# Patient Record
Sex: Male | Born: 1960 | Race: White | Hispanic: No | Marital: Married | Smoking: Never smoker
Health system: Southern US, Community
[De-identification: ages and names within clinical notes are randomized; demographics above are authoritative.]

## PROBLEM LIST (undated history)

## (undated) DIAGNOSIS — I10 Essential (primary) hypertension: Secondary | ICD-10-CM

## (undated) DIAGNOSIS — E785 Hyperlipidemia, unspecified: Secondary | ICD-10-CM

## (undated) DIAGNOSIS — B351 Tinea unguium: Secondary | ICD-10-CM

## (undated) DIAGNOSIS — G4730 Sleep apnea, unspecified: Secondary | ICD-10-CM

## (undated) DIAGNOSIS — F429 Obsessive-compulsive disorder, unspecified: Secondary | ICD-10-CM

## (undated) DIAGNOSIS — J302 Other seasonal allergic rhinitis: Secondary | ICD-10-CM

## (undated) DIAGNOSIS — L578 Other skin changes due to chronic exposure to nonionizing radiation: Secondary | ICD-10-CM

## (undated) DIAGNOSIS — G4733 Obstructive sleep apnea (adult) (pediatric): Secondary | ICD-10-CM

## (undated) HISTORY — PX: CYSTECTOMY: SUR359

## (undated) HISTORY — DX: Tinea unguium: B35.1

## (undated) HISTORY — DX: Hyperlipidemia, unspecified: E78.5

## (undated) HISTORY — PX: COLONOSCOPY: SHX174

## (undated) HISTORY — DX: Obstructive sleep apnea (adult) (pediatric): G47.33

## (undated) HISTORY — DX: Sleep apnea, unspecified: G47.30

## (undated) HISTORY — DX: Essential (primary) hypertension: I10

## (undated) HISTORY — DX: Other seasonal allergic rhinitis: J30.2

## (undated) HISTORY — DX: Other skin changes due to chronic exposure to nonionizing radiation: L57.8

## (undated) HISTORY — DX: Obsessive-compulsive disorder, unspecified: F42.9

---

## 1999-02-27 ENCOUNTER — Encounter: Payer: Self-pay | Admitting: Pulmonary Disease

## 2002-09-08 ENCOUNTER — Encounter: Admission: RE | Admit: 2002-09-08 | Discharge: 2002-09-08 | Payer: Self-pay | Admitting: Family Medicine

## 2002-09-08 ENCOUNTER — Encounter: Payer: Self-pay | Admitting: Family Medicine

## 2004-07-10 ENCOUNTER — Ambulatory Visit: Payer: Self-pay | Admitting: Gastroenterology

## 2004-07-19 ENCOUNTER — Ambulatory Visit: Payer: Self-pay | Admitting: Family Medicine

## 2004-07-23 ENCOUNTER — Ambulatory Visit: Payer: Self-pay | Admitting: Gastroenterology

## 2004-10-02 ENCOUNTER — Ambulatory Visit: Payer: Self-pay | Admitting: Family Medicine

## 2004-11-05 ENCOUNTER — Ambulatory Visit: Payer: Self-pay | Admitting: Pulmonary Disease

## 2005-04-18 ENCOUNTER — Ambulatory Visit: Payer: Self-pay | Admitting: Family Medicine

## 2005-04-30 ENCOUNTER — Ambulatory Visit: Payer: Self-pay | Admitting: Family Medicine

## 2005-07-21 ENCOUNTER — Ambulatory Visit: Payer: Self-pay | Admitting: Family Medicine

## 2005-11-11 ENCOUNTER — Ambulatory Visit: Payer: Self-pay | Admitting: Family Medicine

## 2006-04-24 ENCOUNTER — Ambulatory Visit: Payer: Self-pay | Admitting: Family Medicine

## 2006-05-07 ENCOUNTER — Ambulatory Visit: Payer: Self-pay | Admitting: Family Medicine

## 2006-06-12 ENCOUNTER — Ambulatory Visit: Payer: Self-pay | Admitting: Family Medicine

## 2007-01-11 ENCOUNTER — Telehealth (INDEPENDENT_AMBULATORY_CARE_PROVIDER_SITE_OTHER): Payer: Self-pay | Admitting: *Deleted

## 2007-01-13 ENCOUNTER — Encounter: Payer: Self-pay | Admitting: Family Medicine

## 2007-01-13 DIAGNOSIS — E785 Hyperlipidemia, unspecified: Secondary | ICD-10-CM

## 2007-01-13 DIAGNOSIS — I10 Essential (primary) hypertension: Secondary | ICD-10-CM | POA: Insufficient documentation

## 2007-01-13 DIAGNOSIS — B351 Tinea unguium: Secondary | ICD-10-CM

## 2007-01-21 ENCOUNTER — Ambulatory Visit: Payer: Self-pay | Admitting: Family Medicine

## 2007-01-21 DIAGNOSIS — L578 Other skin changes due to chronic exposure to nonionizing radiation: Secondary | ICD-10-CM | POA: Insufficient documentation

## 2007-01-21 DIAGNOSIS — F429 Obsessive-compulsive disorder, unspecified: Secondary | ICD-10-CM

## 2007-01-22 LAB — CONVERTED CEMR LAB
ALT: 38 units/L (ref 0–40)
AST: 23 units/L (ref 0–37)
Cholesterol: 159 mg/dL (ref 0–200)
HDL: 29.3 mg/dL — ABNORMAL LOW (ref >39.0)
LDL Cholesterol: 102 mg/dL — ABNORMAL HIGH (ref 0–99)
TSH: 1.1 microintl units/mL (ref 0.35–5.50)
Total CHOL/HDL Ratio: 5.4
Triglycerides: 141 mg/dL (ref 0–149)
VLDL: 28 mg/dL (ref 0–40)

## 2007-02-01 ENCOUNTER — Telehealth: Payer: Self-pay | Admitting: Family Medicine

## 2007-03-03 ENCOUNTER — Ambulatory Visit: Payer: Self-pay | Admitting: Family Medicine

## 2007-04-01 ENCOUNTER — Ambulatory Visit: Payer: Self-pay | Admitting: Professional

## 2007-04-29 ENCOUNTER — Ambulatory Visit: Payer: Self-pay | Admitting: Professional

## 2007-05-06 ENCOUNTER — Ambulatory Visit: Payer: Self-pay | Admitting: Professional

## 2007-07-01 ENCOUNTER — Ambulatory Visit: Payer: Self-pay | Admitting: Professional

## 2007-07-15 ENCOUNTER — Ambulatory Visit: Payer: Self-pay | Admitting: Gastroenterology

## 2007-07-15 ENCOUNTER — Ambulatory Visit: Payer: Self-pay | Admitting: Professional

## 2007-07-26 ENCOUNTER — Ambulatory Visit: Payer: Self-pay | Admitting: Professional

## 2007-07-27 ENCOUNTER — Ambulatory Visit: Payer: Self-pay | Admitting: Gastroenterology

## 2007-07-27 ENCOUNTER — Encounter: Payer: Self-pay | Admitting: Family Medicine

## 2007-07-27 LAB — HM COLONOSCOPY: HM Colonoscopy: NORMAL

## 2007-08-06 ENCOUNTER — Ambulatory Visit: Payer: Self-pay | Admitting: Family Medicine

## 2007-08-09 LAB — CONVERTED CEMR LAB
ALT: 36 units/L (ref 0–53)
AST: 25 units/L (ref 0–37)
Albumin: 4.3 g/dL (ref 3.5–5.2)
Alkaline Phosphatase: 54 units/L (ref 39–117)
BUN: 16 mg/dL (ref 6–23)
Basophils Absolute: 0 10*3/uL (ref 0.0–0.1)
Basophils Relative: 0.4 % (ref 0.0–1.0)
Bilirubin, Direct: 0.1 mg/dL (ref 0.0–0.3)
CO2: 30 meq/L (ref 19–32)
Calcium: 9.7 mg/dL (ref 8.4–10.5)
Chloride: 99 meq/L (ref 96–112)
Cholesterol: 170 mg/dL (ref 0–200)
Creatinine, Ser: 1 mg/dL (ref 0.4–1.5)
Eosinophils Absolute: 0.2 10*3/uL (ref 0.0–0.6)
Eosinophils Relative: 2.5 % (ref 0.0–5.0)
GFR calc Af Amer: 104 mL/min
GFR calc non Af Amer: 86 mL/min
Glucose, Bld: 80 mg/dL (ref 70–99)
HCT: 41.7 % (ref 39.0–52.0)
HDL: 34.7 mg/dL — ABNORMAL LOW (ref >39.0)
Hemoglobin: 14.1 g/dL (ref 13.0–17.0)
LDL Cholesterol: 110 mg/dL — ABNORMAL HIGH (ref 0–99)
Lymphocytes Relative: 24.6 % (ref 12.0–46.0)
MCHC: 33.7 g/dL (ref 30.0–36.0)
MCV: 87.7 fL (ref 78.0–100.0)
Monocytes Absolute: 0.5 10*3/uL (ref 0.2–0.7)
Monocytes Relative: 8.1 % (ref 3.0–11.0)
Neutro Abs: 4.2 10*3/uL (ref 1.4–7.7)
Neutrophils Relative %: 64.4 % (ref 43.0–77.0)
Platelets: 274 10*3/uL (ref 150–400)
Potassium: 4 meq/L (ref 3.5–5.1)
RBC: 4.76 M/uL (ref 4.22–5.81)
RDW: 12.7 % (ref 11.5–14.6)
Sodium: 138 meq/L (ref 135–145)
TSH: 1.17 microintl units/mL (ref 0.35–5.50)
Total Bilirubin: 0.9 mg/dL (ref 0.3–1.2)
Total CHOL/HDL Ratio: 4.9
Total Protein: 7.5 g/dL (ref 6.0–8.3)
Triglycerides: 128 mg/dL (ref 0–149)
VLDL: 26 mg/dL (ref 0–40)
WBC: 6.5 10*3/uL (ref 4.5–10.5)

## 2007-08-11 ENCOUNTER — Ambulatory Visit: Payer: Self-pay | Admitting: Pulmonary Disease

## 2007-08-11 DIAGNOSIS — G4733 Obstructive sleep apnea (adult) (pediatric): Secondary | ICD-10-CM

## 2007-08-19 ENCOUNTER — Ambulatory Visit: Payer: Self-pay | Admitting: Professional

## 2007-09-06 ENCOUNTER — Encounter: Payer: Self-pay | Admitting: Family Medicine

## 2007-09-06 ENCOUNTER — Encounter (INDEPENDENT_AMBULATORY_CARE_PROVIDER_SITE_OTHER): Payer: Self-pay | Admitting: *Deleted

## 2007-09-10 ENCOUNTER — Ambulatory Visit: Payer: Self-pay | Admitting: Pulmonary Disease

## 2007-09-13 ENCOUNTER — Ambulatory Visit: Payer: Self-pay | Admitting: Professional

## 2007-10-08 ENCOUNTER — Encounter: Payer: Self-pay | Admitting: Pulmonary Disease

## 2007-10-13 ENCOUNTER — Ambulatory Visit: Payer: Self-pay | Admitting: Family Medicine

## 2007-10-14 ENCOUNTER — Telehealth (INDEPENDENT_AMBULATORY_CARE_PROVIDER_SITE_OTHER): Payer: Self-pay | Admitting: Internal Medicine

## 2008-02-08 ENCOUNTER — Telehealth: Payer: Self-pay | Admitting: Family Medicine

## 2008-03-09 ENCOUNTER — Ambulatory Visit: Payer: Self-pay | Admitting: Pulmonary Disease

## 2008-04-14 ENCOUNTER — Ambulatory Visit: Payer: Self-pay | Admitting: Family Medicine

## 2008-06-16 ENCOUNTER — Encounter (INDEPENDENT_AMBULATORY_CARE_PROVIDER_SITE_OTHER): Payer: Self-pay | Admitting: *Deleted

## 2008-06-27 ENCOUNTER — Ambulatory Visit: Payer: Self-pay | Admitting: Family Medicine

## 2008-07-17 ENCOUNTER — Telehealth: Payer: Self-pay | Admitting: Family Medicine

## 2008-09-20 ENCOUNTER — Ambulatory Visit: Payer: Self-pay | Admitting: Family Medicine

## 2008-09-22 ENCOUNTER — Ambulatory Visit: Payer: Self-pay | Admitting: Family Medicine

## 2008-09-25 LAB — CONVERTED CEMR LAB
ALT: 42 units/L (ref 0–53)
AST: 28 units/L (ref 0–37)
Albumin: 3.9 g/dL (ref 3.5–5.2)
Alkaline Phosphatase: 44 units/L (ref 39–117)
BUN: 16 mg/dL (ref 6–23)
Basophils Absolute: 0 10*3/uL (ref 0.0–0.1)
Basophils Relative: 0.3 % (ref 0.0–3.0)
Bilirubin, Direct: 0.1 mg/dL (ref 0.0–0.3)
CO2: 29 meq/L (ref 19–32)
Calcium: 9.3 mg/dL (ref 8.4–10.5)
Chloride: 105 meq/L (ref 96–112)
Cholesterol: 137 mg/dL (ref 0–200)
Creatinine, Ser: 1 mg/dL (ref 0.4–1.5)
Eosinophils Absolute: 0.2 10*3/uL (ref 0.0–0.7)
Eosinophils Relative: 2.9 % (ref 0.0–5.0)
GFR calc Af Amer: 104 mL/min
GFR calc non Af Amer: 86 mL/min
Glucose, Bld: 85 mg/dL (ref 70–99)
HCT: 36.9 % — ABNORMAL LOW (ref 39.0–52.0)
HDL: 27.2 mg/dL — ABNORMAL LOW (ref >39.0)
Hemoglobin: 12.6 g/dL — ABNORMAL LOW (ref 13.0–17.0)
LDL Cholesterol: 79 mg/dL (ref 0–99)
Lymphocytes Relative: 25.1 % (ref 12.0–46.0)
MCHC: 34.2 g/dL (ref 30.0–36.0)
MCV: 88.7 fL (ref 78.0–100.0)
Monocytes Absolute: 0.5 10*3/uL (ref 0.1–1.0)
Monocytes Relative: 9 % (ref 3.0–12.0)
Neutro Abs: 3.9 10*3/uL (ref 1.4–7.7)
Neutrophils Relative %: 62.7 % (ref 43.0–77.0)
Platelets: 262 10*3/uL (ref 150–400)
Potassium: 4.4 meq/L (ref 3.5–5.1)
RBC: 4.15 M/uL — ABNORMAL LOW (ref 4.22–5.81)
RDW: 13 % (ref 11.5–14.6)
Sodium: 140 meq/L (ref 135–145)
TSH: 1.05 microintl units/mL (ref 0.35–5.50)
Total Bilirubin: 0.8 mg/dL (ref 0.3–1.2)
Total CHOL/HDL Ratio: 5
Total Protein: 7 g/dL (ref 6.0–8.3)
Triglycerides: 154 mg/dL — ABNORMAL HIGH (ref 0–149)
VLDL: 31 mg/dL (ref 0–40)
WBC: 6.1 10*3/uL (ref 4.5–10.5)

## 2008-09-28 ENCOUNTER — Telehealth: Payer: Self-pay | Admitting: Family Medicine

## 2008-10-13 ENCOUNTER — Ambulatory Visit: Payer: Self-pay | Admitting: Internal Medicine

## 2008-10-13 DIAGNOSIS — J309 Allergic rhinitis, unspecified: Secondary | ICD-10-CM

## 2008-10-26 ENCOUNTER — Telehealth (INDEPENDENT_AMBULATORY_CARE_PROVIDER_SITE_OTHER): Payer: Self-pay | Admitting: Internal Medicine

## 2008-10-31 ENCOUNTER — Telehealth (INDEPENDENT_AMBULATORY_CARE_PROVIDER_SITE_OTHER): Payer: Self-pay | Admitting: Internal Medicine

## 2008-12-06 ENCOUNTER — Ambulatory Visit: Payer: Self-pay | Admitting: Family Medicine

## 2009-01-05 ENCOUNTER — Ambulatory Visit: Payer: Self-pay | Admitting: Family Medicine

## 2009-01-05 LAB — CONVERTED CEMR LAB
Casts: 0 /lpf
Glucose, Urine, Semiquant: NEGATIVE
Nitrite: NEGATIVE
Specific Gravity, Urine: 1.01
Urine crystals, microscopic: 0 /hpf
WBC Urine, dipstick: NEGATIVE
Yeast, UA: 0
pH: 7

## 2009-01-08 ENCOUNTER — Telehealth: Payer: Self-pay | Admitting: Family Medicine

## 2009-01-09 ENCOUNTER — Telehealth: Payer: Self-pay | Admitting: Family Medicine

## 2009-01-09 ENCOUNTER — Ambulatory Visit: Payer: Self-pay | Admitting: Cardiology

## 2009-01-10 ENCOUNTER — Telehealth: Payer: Self-pay | Admitting: Family Medicine

## 2009-03-25 ENCOUNTER — Encounter: Payer: Self-pay | Admitting: Pulmonary Disease

## 2009-04-09 ENCOUNTER — Ambulatory Visit: Payer: Self-pay | Admitting: Pulmonary Disease

## 2009-04-28 ENCOUNTER — Encounter: Payer: Self-pay | Admitting: Family Medicine

## 2009-07-30 ENCOUNTER — Telehealth: Payer: Self-pay | Admitting: Family Medicine

## 2009-09-21 ENCOUNTER — Ambulatory Visit: Payer: Self-pay | Admitting: Family Medicine

## 2009-09-24 LAB — CONVERTED CEMR LAB
Albumin: 4.3 g/dL (ref 3.5–5.2)
Alkaline Phosphatase: 50 units/L (ref 39–117)
BUN: 17 mg/dL (ref 6–23)
Basophils Absolute: 0 10*3/uL (ref 0.0–0.1)
CO2: 27 meq/L (ref 19–32)
Calcium: 9.4 mg/dL (ref 8.4–10.5)
Cholesterol: 159 mg/dL (ref 0–200)
Creatinine, Ser: 1 mg/dL (ref 0.4–1.5)
Eosinophils Absolute: 0.2 10*3/uL (ref 0.0–0.7)
Glucose, Bld: 81 mg/dL (ref 70–99)
HDL: 31 mg/dL — ABNORMAL LOW (ref >39.00)
Hemoglobin: 14 g/dL (ref 13.0–17.0)
Lymphocytes Relative: 23.7 % (ref 12.0–46.0)
MCHC: 32.7 g/dL (ref 30.0–36.0)
Neutro Abs: 4.3 10*3/uL (ref 1.4–7.7)
Neutrophils Relative %: 65 % (ref 43.0–77.0)
Platelets: 250 10*3/uL (ref 150.0–400.0)
RDW: 12.8 % (ref 11.5–14.6)
Total Bilirubin: 1 mg/dL (ref 0.3–1.2)
Triglycerides: 209 mg/dL — ABNORMAL HIGH (ref 0.0–149.0)

## 2009-10-18 IMAGING — CT CT ABDOMEN W/O CM
2 of 4 series · 17 of 46 positions shown, 19 images · non-contrast
Comparison: None.

CT ABDOMEN

CLINICAL DATA: Left flank pain and left lower quadrant pain for 5
days.  Microscopic hematuria.  History of right renal stones.

CT ABDOMEN AND PELVIS WITHOUT CONTRAST
TECHNIQUE: Multidetector CT imaging of the abdomen and pelvis was
performed following the standard protocol without intravenous
contrast.

[Series 2: xl stone wo · axial · 0.95mm/px · z∈[-431,-11]mm · 14 of 115 slices shown, 16 images]
[im 5/115  soft-tissue]
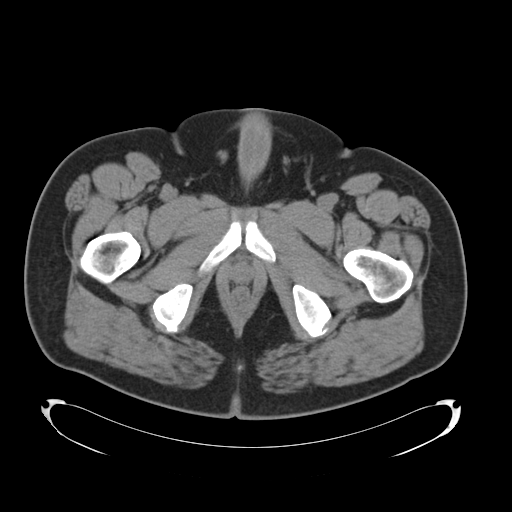
[im 5/115  bone]
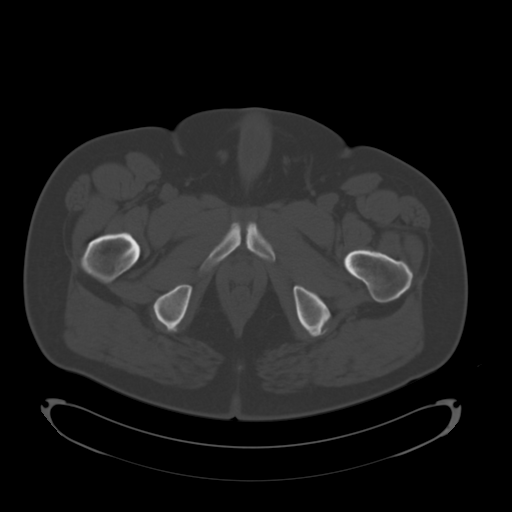
[im 15/115  soft-tissue]
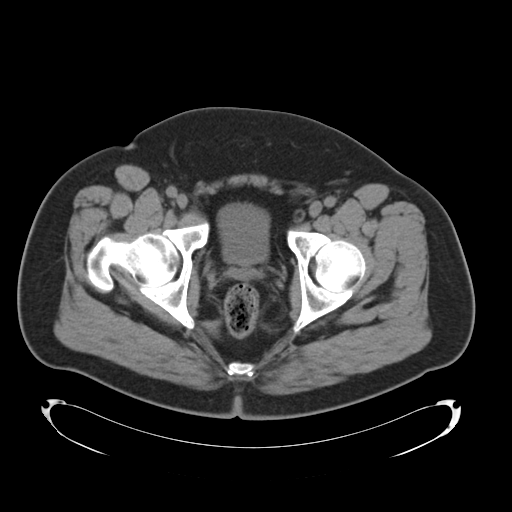
[im 24/115  soft-tissue]
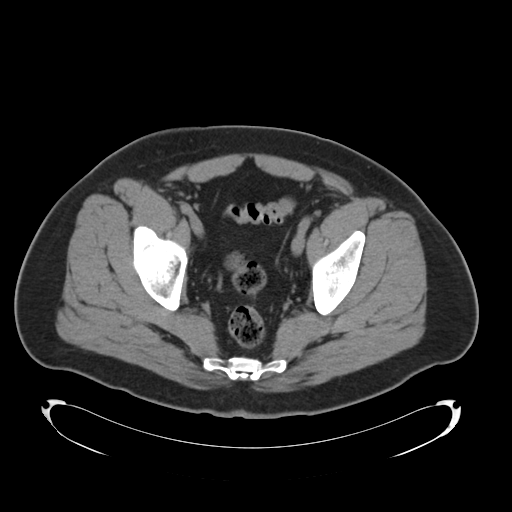
[im 29/115  soft-tissue]
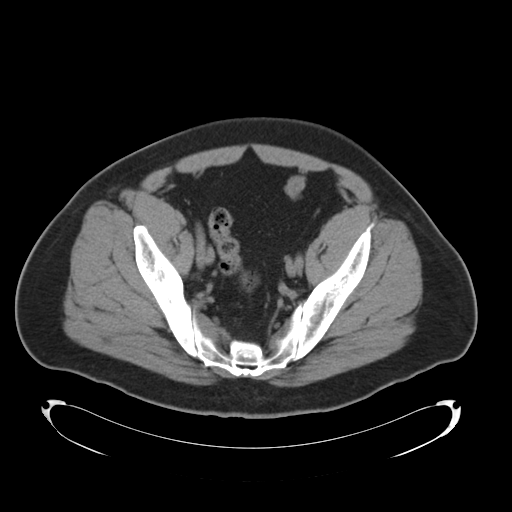
[im 39/115  soft-tissue]
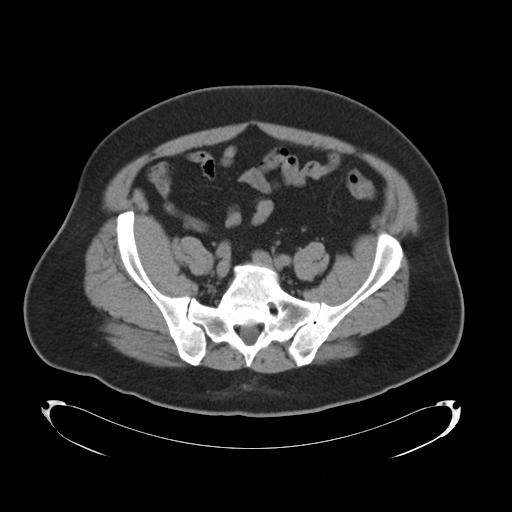
[im 48/115  soft-tissue]
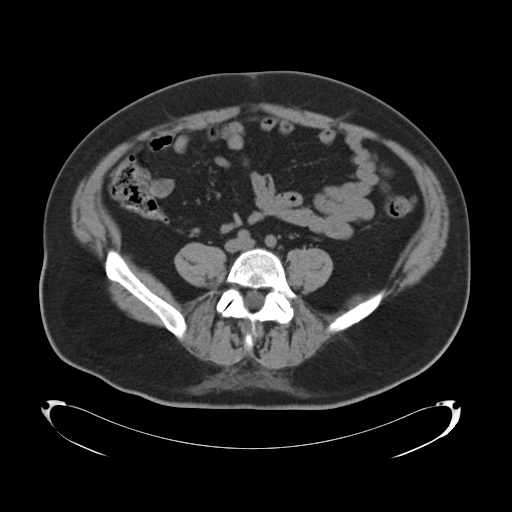
[im 53/115  soft-tissue]
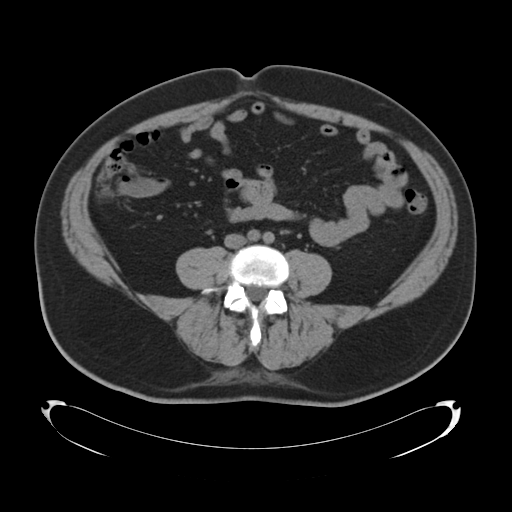
[im 62/115  soft-tissue]
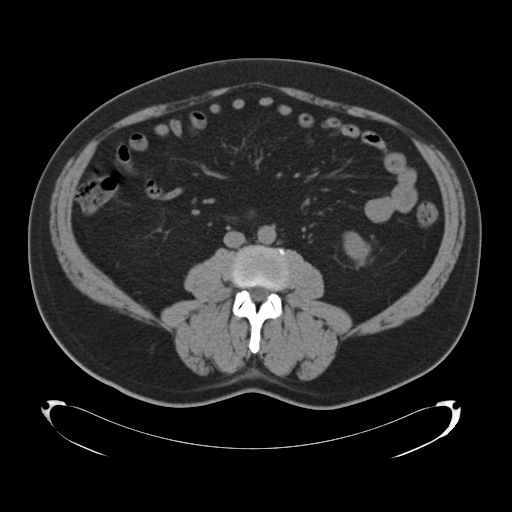
[im 67/115  soft-tissue]
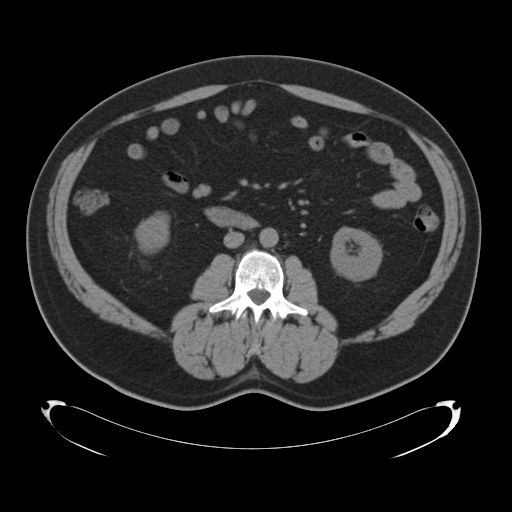
[im 67/115  bone]
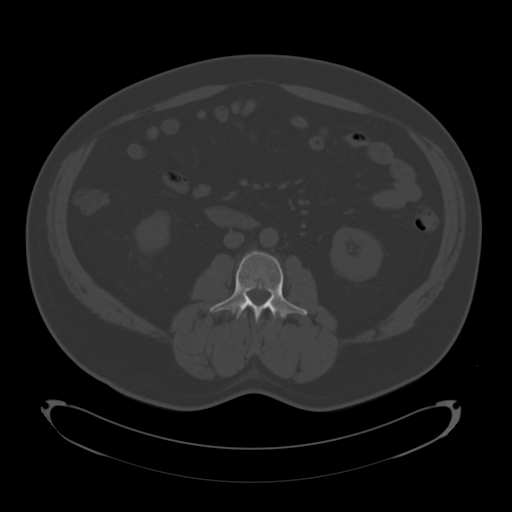
[im 77/115  soft-tissue]
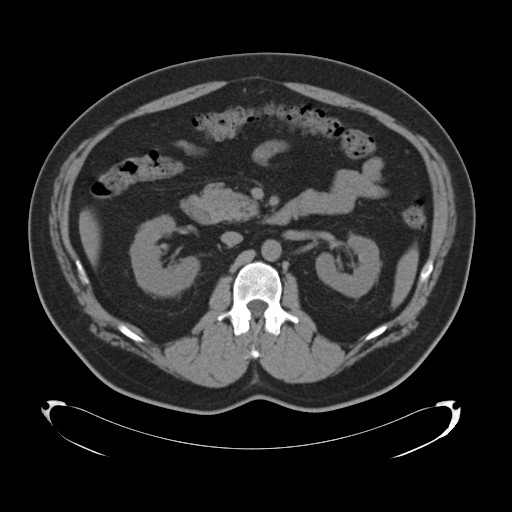
[im 86/115  soft-tissue]
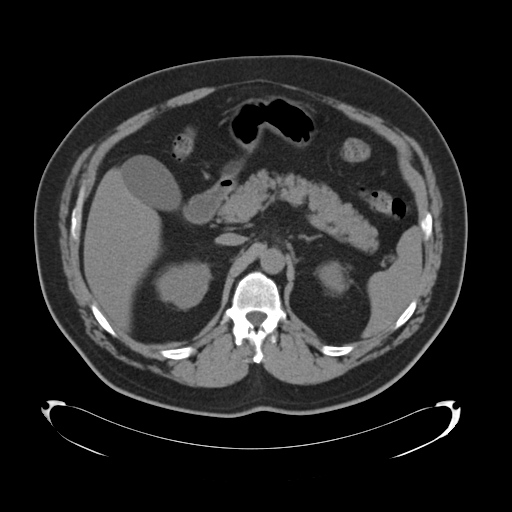
[im 91/115  soft-tissue]
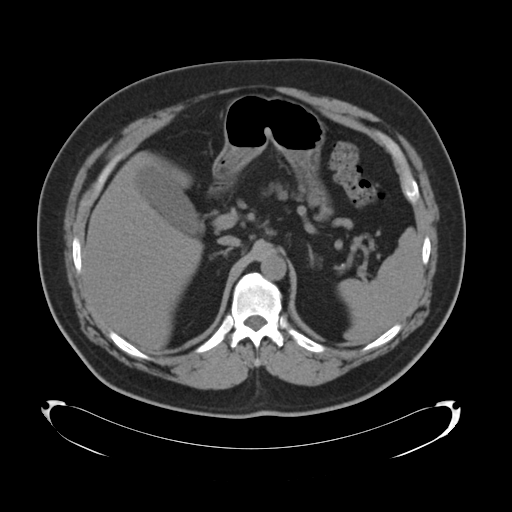
[im 100/115  soft-tissue]
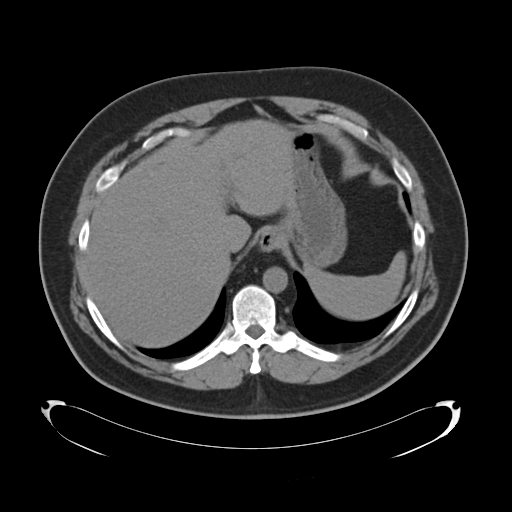
[im 110/115  soft-tissue]
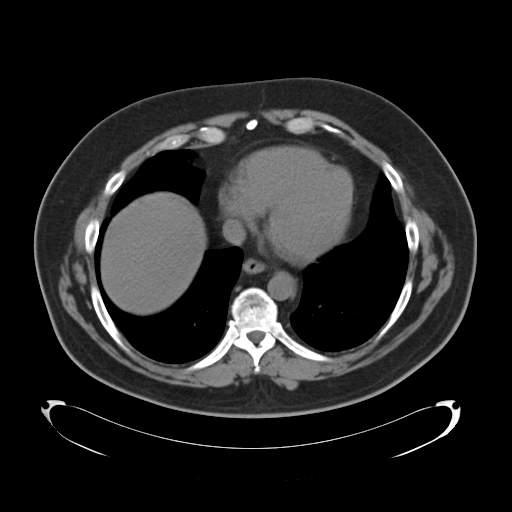

[Series 602: <mpr range> · coronal · 0.95mm/px · 3 of 155 slices shown]
[im 52/155  soft-tissue]
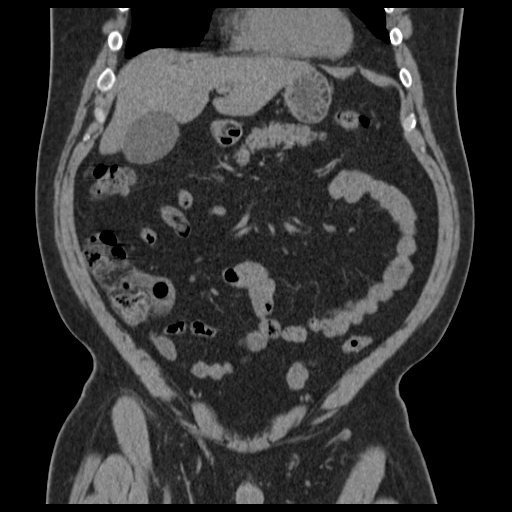
[im 69/155  soft-tissue]
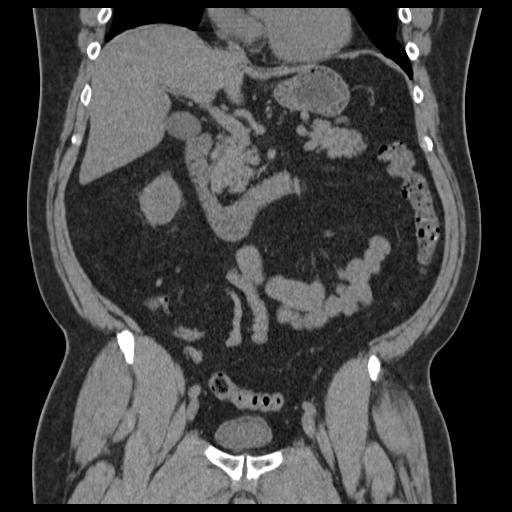
[im 86/155  soft-tissue]
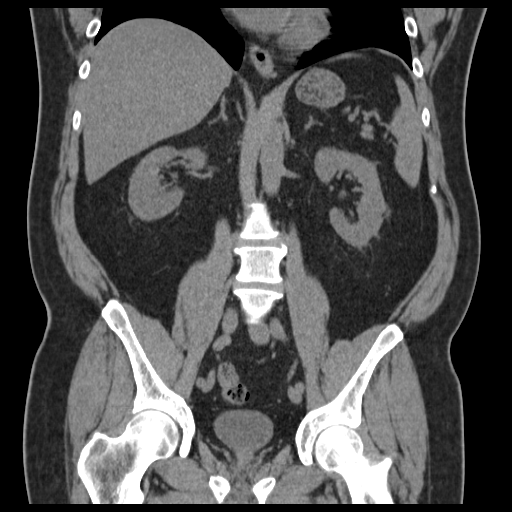

[17 of 46 positions shown; findings below may reference images not displayed]

FINDINGS: Lung bases clear.  No renal calculi or findings to
suggest renal collecting system obstruction.  Fatty infiltration
liver.  Unenhanced imaging of the liver otherwise unremarkable.

Unenhanced images of the spleen, pancreas and adrenal glands
unremarkable.  No calcified gallstones.  No abdominal aortic
aneurysm.  Mild calcification aortic bifurcation.  No upper
abdominal abnormal fluid collection or inflammatory process. Mild
degenerative changes lower thoracic upper lumbar spine.
IMPRESSION: No evidence of renal calculi or findings to suggest renal
collecting system obstruction.

CT PELVIS
FINDINGS: No ureteral calculi.  Evaluation of bladder limited
without contrast.  No pelvic inflammatory process.
IMPRESSION: No ureteral calculi.

No pelvic inflammatory process.

Atherosclerotic type changes aortic bifurcation extending into
common iliac arteries.

## 2009-12-24 ENCOUNTER — Ambulatory Visit: Payer: Self-pay | Admitting: Family Medicine

## 2009-12-24 LAB — CONVERTED CEMR LAB
AST: 24 units/L (ref 0–37)
Albumin: 4.2 g/dL (ref 3.5–5.2)
Alkaline Phosphatase: 60 units/L (ref 39–117)
Cholesterol: 133 mg/dL (ref 0–200)
LDL Cholesterol: 80 mg/dL (ref 0–99)
Total Protein: 6.6 g/dL (ref 6.0–8.3)
Triglycerides: 131 mg/dL (ref 0.0–149.0)

## 2010-01-01 ENCOUNTER — Ambulatory Visit: Payer: Self-pay | Admitting: Family Medicine

## 2010-01-01 DIAGNOSIS — E6609 Other obesity due to excess calories: Secondary | ICD-10-CM | POA: Insufficient documentation

## 2010-02-14 ENCOUNTER — Telehealth: Payer: Self-pay | Admitting: Family Medicine

## 2010-04-09 ENCOUNTER — Telehealth: Payer: Self-pay | Admitting: Pulmonary Disease

## 2010-04-24 ENCOUNTER — Ambulatory Visit: Payer: Self-pay | Admitting: Pulmonary Disease

## 2010-04-24 ENCOUNTER — Encounter (INDEPENDENT_AMBULATORY_CARE_PROVIDER_SITE_OTHER): Payer: Self-pay | Admitting: *Deleted

## 2010-09-16 ENCOUNTER — Telehealth (INDEPENDENT_AMBULATORY_CARE_PROVIDER_SITE_OTHER): Payer: Self-pay | Admitting: *Deleted

## 2010-09-18 ENCOUNTER — Ambulatory Visit
Admission: RE | Admit: 2010-09-18 | Discharge: 2010-09-18 | Payer: Self-pay | Source: Home / Self Care | Attending: Family Medicine | Admitting: Family Medicine

## 2010-09-18 ENCOUNTER — Other Ambulatory Visit: Payer: Self-pay | Admitting: Family Medicine

## 2010-09-18 LAB — BASIC METABOLIC PANEL
BUN: 21 mg/dL (ref 6–23)
CO2: 28 mEq/L (ref 19–32)
Calcium: 9.3 mg/dL (ref 8.4–10.5)
Chloride: 105 mEq/L (ref 96–112)
Creatinine, Ser: 1 mg/dL (ref 0.4–1.5)
GFR: 85.88 mL/min (ref >60.00)
Glucose, Bld: 81 mg/dL (ref 70–99)
Potassium: 4.4 mEq/L (ref 3.5–5.1)
Sodium: 139 mEq/L (ref 135–145)

## 2010-09-18 LAB — CBC WITH DIFFERENTIAL/PLATELET
Basophils Absolute: 0 10*3/uL (ref 0.0–0.1)
Basophils Relative: 0.4 % (ref 0.0–3.0)
Eosinophils Absolute: 0.2 10*3/uL (ref 0.0–0.7)
Eosinophils Relative: 3.3 % (ref 0.0–5.0)
HCT: 41.2 % (ref 39.0–52.0)
Hemoglobin: 13.9 g/dL (ref 13.0–17.0)
Lymphocytes Relative: 27.5 % (ref 12.0–46.0)
Lymphs Abs: 1.8 10*3/uL (ref 0.7–4.0)
MCHC: 33.8 g/dL (ref 30.0–36.0)
MCV: 89.3 fl (ref 78.0–100.0)
Monocytes Absolute: 0.6 10*3/uL (ref 0.1–1.0)
Monocytes Relative: 8.6 % (ref 3.0–12.0)
Neutro Abs: 3.9 10*3/uL (ref 1.4–7.7)
Neutrophils Relative %: 60.2 % (ref 43.0–77.0)
Platelets: 244 10*3/uL (ref 150.0–400.0)
RBC: 4.61 Mil/uL (ref 4.22–5.81)
RDW: 13.6 % (ref 11.5–14.6)
WBC: 6.6 10*3/uL (ref 4.5–10.5)

## 2010-09-18 LAB — TSH: TSH: 1.51 u[IU]/mL (ref 0.35–5.50)

## 2010-09-18 LAB — HEPATIC FUNCTION PANEL
ALT: 38 U/L (ref 0–53)
AST: 24 U/L (ref 0–37)
Albumin: 4.1 g/dL (ref 3.5–5.2)
Alkaline Phosphatase: 49 U/L (ref 39–117)
Bilirubin, Direct: 0.1 mg/dL (ref 0.0–0.3)
Total Bilirubin: 0.6 mg/dL (ref 0.3–1.2)
Total Protein: 7 g/dL (ref 6.0–8.3)

## 2010-09-18 LAB — LIPID PANEL
Cholesterol: 164 mg/dL (ref 0–200)
HDL: 29.8 mg/dL — ABNORMAL LOW (ref >39.00)
LDL Cholesterol: 109 mg/dL — ABNORMAL HIGH (ref 0–99)
Total CHOL/HDL Ratio: 6
Triglycerides: 125 mg/dL (ref 0.0–149.0)
VLDL: 25 mg/dL (ref 0.0–40.0)

## 2010-09-23 ENCOUNTER — Ambulatory Visit
Admission: RE | Admit: 2010-09-23 | Discharge: 2010-09-23 | Payer: Self-pay | Source: Home / Self Care | Attending: Family Medicine | Admitting: Family Medicine

## 2010-10-03 NOTE — Assessment & Plan Note (Signed)
Summary: CPX/CLE   Vital Signs:  Patient profile:   50 year old male Height:      70.25 inches Weight:      279 pounds BMI:     39.89 Temp:     98.3 degrees F oral Pulse rate:   64 / minute Pulse rhythm:   regular BP sitting:   118 / 80  (left arm) Cuff size:   regular  Vitals Entered By: Lowella Petties CMA (September 21, 2009 9:55 AM) CC: Check up   History of Present Illness: here for health mt exam  is doing well overall   is having some hemorroid problems -- occ brb streak on stool  some pain in rectum when he has large bm- is sharp in nature  sometimes stools are hard does eat high fiber diet  this happens infrequently -- last time was 2 weeks ago  occ uses prep H cream and that helps  occ feels a hem prolapsing  no abd pain   has a spot in between shoulder blades - bothers him  is better with new mattress  thinks he has a pinched nerve -- tender spot -- when it acts up goes down the back of arm making medial fingers numb  tylenol helps   wt is stable   is starting with wt [employer] on monday- plans to go to mts with her wife  plan for exercise - is to go back to the gym  soon - has the x box - does [employer]   colonosc last nl in 08-- due in 2013   bp is well controlled at 118/80 today   OCD -- is doing fine with that    lipids due Last Lipid ProfileCholesterol: 137 (09/22/2008 8:46:00 AM)HDL:  27.2 (09/22/2008 8:46:00 AM)LDL:  79 (09/22/2008 8:46:00 AM)Triglycerides:  Last Liver profileSGOT:  28 (09/22/2008 8:46:00 AM)SPGT:  42 (09/22/2008 8:46:00 AM)T. Bili:  0.8 (09/22/2008 8:46:00 AM)Alk Phos:  44 (09/22/2008 8:46:00 AM)   Td 04 and up to date  did get a flu shot this year   no prostate problems - no nocturia   Allergies: 1)  ! 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) ANXIETY STATE NOS (ICD-300.00) Hx of SLEEP APNEA (ICD-780.57) Hx of ONYCHOMYCOSIS (ICD-110.1) HYPERTENSION (ICD-401.9) HYPERLIPIDEMIA (ICD-272.4)    Past Surgical History: Last updated: 08/04/2007 Cyst on hand (1994) Colonoscopy- polyps (07/2001),  polyp (07/2004) colonoscopy 11/.08 nl  Family History: Last updated: 01/13/2007 Father: HTN, DM2 Mother: Deceased- colon cancer Siblings:   Social History: Last updated: 06/27/2008 Marital Status: Married non smoker Occupation: Radio producer  Risk Factors: Smoking Status: never (08/11/2007)  Review of Systems General:  Denies fatigue, loss of appetite, and malaise. Eyes:  Denies blurring and eye pain. CV:  Denies chest pain or discomfort, lightheadness, near fainting, and palpitations. Resp:  Denies cough and wheezing. GI:  Complains of hemorrhoids; denies abdominal pain, change in bowel habits, indigestion, and loss of appetite. GU:  Denies nocturia, urinary frequency, and urinary hesitancy. MS:  Denies joint pain and stiffness. Derm:  Denies itching, lesion(s), poor wound healing, and rash. Neuro:  Denies numbness and tingling. Psych:  Denies anxiety and depression; mood is ok-- doing well on current med . Endo:  Denies cold intolerance, excessive thirst, excessive urination, and heat intolerance. Heme:  Denies abnormal bruising and bleeding.  Physical Exam  General:  overweight but generally well appearing  Head:  normocephalic, atraumatic, and no abnormalities observed.   Eyes:  vision grossly intact, pupils equal, pupils round, and pupils reactive to light.  no conjunctival pallor, injection or icterus  Nose:  no nasal discharge.   Mouth:  pharynx pink and moist.   Neck:  supple with full rom and no masses or thyromegally, no JVD or carotid bruit  Chest Wall:  No deformities, masses, tenderness or gynecomastia noted. Lungs:  Normal respiratory effort,  chest expands symmetrically. Lungs are clear to auscultation, no crackles or wheezes. Heart:  Normal rate and regular rhythm. S1 and S2 normal without gallop, murmur, click, rub or other extra sounds. Abdomen:  Bowel sounds positive,abdomen soft and non-tender without masses, organomegaly or hernias noted. no renal bruits  Rectal:  nl tone with heme neg stool very small int hemorroid at 10:00  no active bleeding  no fissures seen currently Prostate:  Prostate gland firm and smooth, no enlargement, nodularity, tenderness, mass, asymmetry or induration. Msk:  No deformity or scoliosis noted of thoracic or lumbar spine.  no acute joint changes  Pulses:  R and L carotid,radial,femoral,dorsalis pedis and posterior tibial pulses are full and equal bilaterally Extremities:  No clubbing, cyanosis, edema, or deformity noted with normal full range of motion of all joints.   Neurologic:  sensation intact to light touch, gait normal, and DTRs symmetrical and normal.   Skin:  Intact without suspicious lesions or rashes Cervical Nodes:  No lymphadenopathy noted Inguinal Nodes:  No significant adenopathy Psych:  normal affect, talkative and pleasant    Impression & Recommendations:  Problem # 1:  HEALTH MAINTENANCE EXAM (ICD-V70.0) reviewed health habits including diet, exercise and skin cancer prevention reviewed health maintenance list and family history  again stressed imp of wt loss to general health Orders: Venipuncture (41324) TLB-Lipid Panel (80061-LIPID) TLB-BMP (Basic Metabolic Panel-BMET) (80048-METABOL) TLB-CBC Platelet - w/Differential (85025-CBCD) TLB-Hepatic/Liver Function Pnl (80076-HEPATIC) TLB-TSH (Thyroid Stimulating Hormone) (84443-TSH) Prescription Created Electronically (M0102)  Problem # 2:  OBSESSIVE-COMPULSIVE DISORDER (ICD-300.3) Assessment: Unchanged overall stable  continue lexapro  Problem # 3:  HYPERTENSION (ICD-401.9) Assessment: Unchanged  good controlwith   lotrel disc healthy diet (low simple sugar/ choose complex carbs/ low sat fat) diet and exercise in detail  His updated medication list for this problem includes:    Lotrel 5-10 Mg Caps (Amlodipine besy-benazepril hcl) .Marland Kitchen... Take one by mouth daily brand only  Orders: Venipuncture (72536) TLB-Lipid Panel (80061-LIPID) TLB-BMP (Basic Metabolic Panel-BMET) (80048-METABOL) TLB-CBC Platelet - w/Differential (85025-CBCD) TLB-Hepatic/Liver Function Pnl (80076-HEPATIC) TLB-TSH (Thyroid Stimulating Hormone) (84443-TSH)  BP today: 118/80 Prior BP: 124/82 (04/09/2009)  Labs Reviewed: K+: 4.4 (09/22/2008) Creat: : 1.0 (09/22/2008)   Chol: 137 (09/22/2008)   HDL: 27.2 (09/22/2008)   LDL: 79 (09/22/2008)   TG: 154 (09/22/2008)  Problem # 4:  HYPERLIPIDEMIA (ICD-272.4) Assessment: Unchanged  check labs today has been stable with statin and diet His updated medication list for this problem includes:    Zocor 20 Mg Tabs (Simvastatin) .Marland Kitchen... Take one by mouth daily  Orders: Venipuncture (64403) TLB-Lipid Panel (80061-LIPID) TLB-BMP (Basic Metabolic Panel-BMET) (80048-METABOL) TLB-CBC Platelet - w/Differential (85025-CBCD) TLB-Hepatic/Liver Function Pnl (80076-HEPATIC) TLB-TSH (Thyroid Stimulating Hormone) (84443-TSH)  Labs Reviewed: SGOT: 28 (09/22/2008)   SGPT: 42 (09/22/2008)   HDL:27.2 (09/22/2008), 34.7 (08/06/2007)  LDL:79 (09/22/2008), 110 (47/42/5956)  Chol:137 (09/22/2008), 170 (08/06/2007)  Trig:154 (09/22/2008), 128 (08/06/2007)  Problem # 5:  BLOOD IN STOOL (ICD-578.1) Assessment: New  suspect rectalfissure intermittent with straining /  vs small hemorroid disc keeping stools soft  anusol hc and update  early colonosc in order if not imp  Orders: Anoscopy (62130)  Complete Medication List: 1)  Zocor 20 Mg Tabs (Simvastatin) .... Take one by mouth daily 2)  Aspirin 81 Mg Tbec (Aspirin) .... Take one by mouth a day 3)  Lotrel 5-10 Mg Caps (Amlodipine besy-benazepril hcl)  .... Take one by mouth daily brand only 4)  Lexapro 20 Mg Tabs (Escitalopram oxalate) .... One by mouth daily 5)  Multivitamins Tabs (Multiple vitamin) .... Take 1 tablet by mouth once a day 6)  Fish Oil Caps  .... One by mouth daily 7)  Astepro 0.15 % Soln (Azelastine hcl) .... As needed 8)  Flonase 50 Mcg/act Susp (Fluticasone propionate) .... As needed 9)  Anusol-hc 25 Mg Supp (Hydrocortisone acetate) .Marland Kitchen.. 1 per rectum at bedtime for 7 days as needed for rectal pain or bleeding  Patient Instructions: 1)  get back to healthy diet and exercise 2)  labs today 3)  use anusol hc  as needed -- for rectal pain or bleeding - if not improved or if it becomes persistant - let me know  4)  keep stools soft 5)  keep up water intake  Prescriptions: ANUSOL-HC 25 MG SUPP (HYDROCORTISONE ACETATE) 1 per rectum at bedtime for 7 days as needed for rectal pain or bleeding  #7 x 2   Entered and Authorized by:   Judith Part MD   Signed by:   Judith Part MD on 09/21/2009   Method used:   Electronically to        [facility] # [phone]* (retail)       [address]       [place], [place]  [address]       Ph: [phone]       Fax: [phone]   RxID:   [id number] LEXAPRO 20 MG TABS (ESCITALOPRAM OXALATE) one by mouth daily  #30 x 11   Entered and Authorized by:   Judith Part MD   Signed by:   Judith Part MD on 09/21/2009   Method used:   Electronically to        [facility]. # [phone]* (retail)       [address]       [place], [place]  [address]       Ph: [phone]       Fax: [phone]   RxID:   [id number] LOTREL 5-10 MG CAPS (AMLODIPINE BESY-BENAZEPRIL HCL) Take one by mouth daily Brand only Brand medically necessary #30 x 11   Entered and Authorized by:   Judith Part MD   Signed by:   Judith Part MD on 09/21/2009   Method used:   Electronically to        [facility]. # [phone]* (retail)       [address]        [place], [place]  [address]       Ph: [phone]       Fax: [phone]   RxID:   [id number] ZOCOR 20 MG TABS (SIMVASTATIN) Take one by mouth daily  #30 x 11   Entered and Authorized by:   Judith Part MD   Signed by:   Judith Part MD on  09/21/2009   Method used:   Electronically to        [facility]. # [phone]* (retail)       [address]       [place], [place]  [address]       Ph: [phone]       Fax: [phone]   RxID:   [id number]   Prior Medications (reviewed today): ASPIRIN 81 MG TBEC (ASPIRIN) Take one by mouth a day LOTREL 5-10 MG CAPS (AMLODIPINE BESY-BENAZEPRIL HCL) Take one by mouth daily Brand only LEXAPRO 20 MG TABS (ESCITALOPRAM OXALATE) one by mouth daily MULTIVITAMINS   TABS (MULTIPLE VITAMIN) Take 1 tablet by mouth once a day FISH OIL CAPS () one by mouth daily ASTEPRO 0.15 % SOLN (AZELASTINE HCL) as needed FLONASE 50 MCG/ACT SUSP (FLUTICASONE PROPIONATE) as needed Current Allergies: ! ZOLOFT AMLODIPINE BESY-BENAZEPRIL HCL (AMLODIPINE BESY-BENAZEPRIL HCL)

## 2010-10-03 NOTE — Progress Notes (Signed)
Summary: pharmacy wants to change lotrel  Phone Note From Pharmacy   Caller: [facility] mail order Summary of Call: Pharmacy is asking to change lotrel to generic, form is on your shelf. Initial call taken by: Lowella Petties CMA,  February 14, 2010 9:07 AM  Follow-up for Phone Call        generic lotrel did NOT control his bp  I said no  please ask them not to ask me about this ever again -- they need to note in their records he cannot have generic at all Follow-up by: Judith Part MD,  February 14, 2010 9:34 AM  Additional Follow-up for Phone Call Additional follow up Details #1::        Kathlene November with [facility] [phone] notified as instructed by telephone. Also faxed completed form to [phone].Lewanda Rife LPN  February 14, 2010 10:14 AM

## 2010-10-03 NOTE — Assessment & Plan Note (Signed)
Summary: rov for osa   Primary Provider/Referring Provider:  Judith Part MD  CC:  Pt is here for a 1 yr f/u appt on his OSA.  Pt states he wears his cpap machine every night.  Approx 6 to 8 hours per night. Pt denied any complaints with mask or pressure.  Pt wonders if he is due for a new cpap machine. Pt denied any new complaints/concerns. .  History of Present Illness: The pt comes in today for f/u of his known osa.  He is wearing cpap compliantly, and reports no issues with his mask or pressure.  He is sleeping well at night, and denies any issues with daytime sleepiness.  He has had his machine for some time, and wants to know if he is due for a replacement.  Current Medications (verified): 1)  Zocor 20 Mg Tabs (Simvastatin) .... Take One By Mouth Daily 2)  Aspirin 81 Mg Tbec (Aspirin) .... Take One By Mouth A Day 3)  Lotrel 5-10 Mg Caps (Amlodipine Besy-Benazepril Hcl) .... Take One By Mouth Daily Brand Only 4)  Lexapro 20 Mg Tabs (Escitalopram Oxalate) .... One By Mouth Daily 5)  Multivitamins   Tabs (Multiple Vitamin) .... Take 1 Tablet By Mouth Once A Day 6)  Fish Oil Caps .... One By Mouth Daily 7)  Astepro 0.15 % Soln (Azelastine Hcl) .... As Needed 8)  Flonase 50 Mcg/act Susp (Fluticasone Propionate) .... As Needed 9)  Anusol-Hc 25 Mg Supp (Hydrocortisone Acetate) .Marland Kitchen.. 1 Per Rectum At Bedtime For 7 Days As Needed For Rectal Pain or Bleeding 10)  Allegra Otc .... Take As Directed  Allergies (verified): 1)  ! Zoloft 2)  ! * Shellfish 3)  Amlodipine Besy-Benazepril Hcl (Amlodipine Besy-Benazepril Hcl)  Review of Systems  The patient denies shortness of breath with activity, shortness of breath at rest, productive cough, non-productive cough, coughing up blood, chest pain, irregular heartbeats, acid heartburn, indigestion, loss of appetite, weight change, abdominal pain, difficulty swallowing, sore throat, tooth/dental problems, headaches, nasal congestion/difficulty breathing  through nose, sneezing, itching, ear ache, anxiety, depression, hand/feet swelling, joint stiffness or pain, rash, change in color of mucus, and fever.    Vital Signs:  Patient profile:   50 year old male Height:      70.25 inches Weight:      276.38 pounds BMI:     39.52 O2 Sat:      94 % on Room air Temp:     97.9 degrees F oral Pulse rate:   85 / minute BP sitting:   110 / 68  (right arm) Cuff size:   large  Vitals Entered By: Arman Filter LPN (April 24, 2010 3:02 PM)  O2 Flow:  Room air CC: Pt is here for a 1 yr f/u appt on his OSA.  Pt states he wears his cpap machine every night.  Approx 6 to 8 hours per night. Pt denied any complaints with mask or pressure.  Pt wonders if he is due for a new cpap machine. Pt denied any new complaints/concerns.  Comments Medications reviewed with patient Arman Filter LPN  April 24, 2010 3:02 PM    Physical Exam  General:  ow male in nad Nose:  no skin breakdown or pressure necrosis from cpap mask Extremities:  no edema or cyanosis  Neurologic:  alert, does not appear sleepy, and moves all 4 without obvious deficit.     Impression & Recommendations:  Problem # 1:  OBSTRUCTIVE SLEEP APNEA (ICD-327.23)  the pt is doing very well with cpap, and reports no issues with mask or pressure.  He is sleeping well, and is satisfied with his QOL.  Will need to check with insurance company and dme to see if it is time for a new cpap machine.  I have also encouraged him to work aggressively on wieght loss.  Medications Added to Medication List This Visit: 1)  Allegra Otc  .... Take as directed  Other Orders: Est. Patient Level III (04540) DME Referral (DME)  Patient Instructions: 1)  continue on cpap, and let me know if any issues 2)  work on weight loss 3)  followup with me in one year.

## 2010-10-03 NOTE — Assessment & Plan Note (Signed)
Summary: follow up to discuss blood work   Vital Signs:  Patient profile:   50 year old male Height:      70.25 inches Weight:      266.75 pounds BMI:     38.14 Temp:     98.8 degrees F oral Pulse rate:   64 / minute Pulse rhythm:   regular BP sitting:   106 / 68  (left arm) Cuff size:   large  Vitals Entered By: Lewanda Rife LPN (Jan 02, 4400 8:28 AM) CC: f/u to discuss labs, Hypertension Management   History of Present Illness: here to f/u for HTN and lipids   has lost significant wt -- 13 lb since last visit - bmi 38 now  no exercise yet  promises to start exercise tomorrow  is eating less and watching what he eats  has changed to whole wheat / and more pita bread  getting away from high fat spreads , and eating lower fat meats  eating more fish and salad  gave up hamburger on weight watchers- is down 15 lb   is starting to feel better more energy and sleep is better   used to eat pizza and ate out a lot   worse allergies this season  uses claritin  eyes bother him  HTN ingood control with bp 108/68  lipids better with trig 131/ HDL 27 (low) and LDL 80 (down from 109)- with statin and  diet liver tests entirely nl     Hypertension History:      Positive major cardiovascular risk factors include male age 80 years old or older, hyperlipidemia, and hypertension.  Negative major cardiovascular risk factors include non-tobacco-user status.    Allergies: 1)  ! Zoloft 2)  ! * Shellfish 3)  Amlodipine Besy-Benazepril Hcl (Amlodipine Besy-Benazepril Hcl)  Past History:  Past Surgical History: Last updated: 08/04/2007 Cyst on hand (1994) Colonoscopy- polyps (07/2001),  polyp (07/2004) colonoscopy 11/.08 nl  Family History: Last updated: 01/13/2007 Father: HTN, DM2 Mother: Deceased- colon cancer Siblings:   Social History: Last updated: 06/27/2008 Marital Status: Married non smoker Occupation: Radio producer  Risk Factors: Smoking Status: never  (08/11/2007)  Past Medical History:  OBSTRUCTIVE SLEEP APNEA (ICD-327.23) ACTINIC SKIN DAMAGE (ICD-692.70) OBSESSIVE-COMPULSIVE DISORDER (ICD-300.3) ANXIETY STATE NOS (ICD-300.00) Hx of SLEEP APNEA (ICD-780.57) Hx of ONYCHOMYCOSIS (ICD-110.1) HYPERTENSION (ICD-401.9) HYPERLIPIDEMIA (ICD-272.4) seasonal allergic rhinitis     Review of Systems General:  Denies fatigue, malaise, and sleep disorder. Eyes:  Denies blurring and eye irritation. CV:  Denies chest pain or discomfort, palpitations, shortness of breath with exertion, and swelling of feet. Resp:  Denies cough and wheezing. GI:  Denies abdominal pain, bloody stools, change in bowel habits, and indigestion. MS:  Denies joint pain, joint redness, joint swelling, and muscle aches. Derm:  Denies lesion(s), poor wound healing, and rash. Neuro:  Denies headaches, numbness, and tingling. Psych:  mood is god . Endo:  Denies cold intolerance, excessive thirst, excessive urination, and heat intolerance. Heme:  Denies abnormal bruising and bleeding.  Physical Exam  General:  overweight but generally well appearing  Head:  normocephalic, atraumatic, and no abnormalities observed.   Eyes:  vision grossly intact, pupils equal, pupils round, and pupils reactive to light.   Nose:  nares are boggy Mouth:  pharynx pink and moist.   Neck:  supple with full rom and no masses or thyromegally, no JVD or carotid bruit  Lungs:  Normal respiratory effort, chest expands symmetrically. Lungs are clear to auscultation,  no crackles or wheezes. Heart:  Normal rate and regular rhythm. S1 and S2 normal without gallop, murmur, click, rub or other extra sounds. Msk:  No deformity or scoliosis noted of thoracic or lumbar spine.  no acute joint changes  Pulses:  R and L carotid,radial,femoral,dorsalis pedis and posterior tibial pulses are full and equal bilaterally Extremities:  No clubbing, cyanosis, edema, or deformity noted with normal full range of  motion of all joints.   Neurologic:  sensation intact to light touch, gait normal, and DTRs symmetrical and normal.   Skin:  Intact without suspicious lesions or rashes Cervical Nodes:  No lymphadenopathy noted Psych:  normal affect, talkative and pleasant    Impression & Recommendations:  Problem # 1:  HYPERTENSION (ICD-401.9) Assessment Unchanged  good control with lotrel and wt loss  will continue to monitor - and cut back if necessary  urged to keep up the good work and add exercise His updated medication list for this problem includes:    Lotrel 5-10 Mg Caps (Amlodipine besy-benazepril hcl) .Marland Kitchen... Take one by mouth daily brand only  BP today: 106/68 Prior BP: 118/80 (09/21/2009)  Labs Reviewed: K+: 4.0 (09/21/2009) Creat: : 1.0 (09/21/2009)   Chol: 133 (12/24/2009)   HDL: 27.10 (12/24/2009)   LDL: 80 (12/24/2009)   TG: 131.0 (12/24/2009)  Problem # 2:  HYPERLIPIDEMIA (ICD-272.4) Assessment: Improved  improved with better diet enc to start exercise to bring up the HDL  rev labs and low sat fat diet in detail  lab and f/u jan His updated medication list for this problem includes:    Zocor 20 Mg Tabs (Simvastatin) .Marland Kitchen... Take one by mouth daily  Labs Reviewed: SGOT: 24 (12/24/2009)   SGPT: 41 (12/24/2009)   HDL:27.10 (12/24/2009), 31.00 (09/21/2009)  LDL:80 (12/24/2009), 79 (09/22/2008)  Chol:133 (12/24/2009), 159 (09/21/2009)  Trig:131.0 (12/24/2009), 209.0 (09/21/2009)  Problem # 3:  OBESITY (ICD-278.00) Assessment: Comment Only commended on wt loss so far enc to start daily walking in am   Problem # 4:  ALLERGIC RHINITIS (ICD-477.9) Assessment: Deteriorated this is improved with antihistamine recommend staying on claritin daily instead of as needed through the allergy season  update if wose  His updated medication list for this problem includes:    Astepro 0.15 % Soln (Azelastine hcl) .Marland Kitchen... As needed    Flonase 50 Mcg/act Susp (Fluticasone propionate) .Marland Kitchen... As  needed    Claritin 10 Mg Tabs (Loratadine) ..... Otc as directed.  Complete Medication List: 1)  Zocor 20 Mg Tabs (Simvastatin) .... Take one by mouth daily 2)  Aspirin 81 Mg Tbec (Aspirin) .... Take one by mouth a day 3)  Lotrel 5-10 Mg Caps (Amlodipine besy-benazepril hcl) .... Take one by mouth daily brand only 4)  Lexapro 20 Mg Tabs (Escitalopram oxalate) .... One by mouth daily 5)  Multivitamins Tabs (Multiple vitamin) .... Take 1 tablet by mouth once a day 6)  Fish Oil Caps  .... One by mouth daily 7)  Astepro 0.15 % Soln (Azelastine hcl) .... As needed 8)  Flonase 50 Mcg/act Susp (Fluticasone propionate) .... As needed 9)  Anusol-hc 25 Mg Supp (Hydrocortisone acetate) .Marland Kitchen.. 1 per rectum at bedtime for 7 days as needed for rectal pain or bleeding 10)  Claritin 10 Mg Tabs (Loratadine) .... Otc as directed.  Hypertension Assessment/Plan:      The patient's hypertensive risk group is category B: At least one risk factor (excluding diabetes) with no target organ damage.  His calculated 10 year risk of coronary heart  disease is 4 %.  Today's blood pressure is 106/68.     Patient Instructions: 1)  take claritin every day until allergy season is over  2)  keep up the good work with diet and exercise 3)  start walking 30 min per day in the ams - tomorrow  4)  schedule fasting labs and then follow up for PE in january wellness/ lipids/ v70.0   Current Allergies (reviewed today): ! ZOLOFT ! * SHELLFISH AMLODIPINE BESY-BENAZEPRIL HCL (AMLODIPINE BESY-BENAZEPRIL HCL)

## 2010-10-03 NOTE — Miscellaneous (Signed)
Summary: Flu Vaccination/[facility]  Flu Vaccination/[facility]   Imported By: Maryln Gottron 05/09/2009 15:39:23  _____________________________________________________________________  External Attachment:    Type:   Image     Comment:   External Document  Appended Document: Flu Vaccination/[facility] please note flu shot for EMR flow sheet  Appended Document: Flu Vaccination/[facility]     Clinical Lists Changes  Observations: Added new observation of FLU VAX: Fluvarin (04/28/2009 8:34)

## 2010-10-03 NOTE — Progress Notes (Signed)
----   Converted from flag ---- ---- 09/15/2010 4:13 PM, Colon Flattery Tower MD wrote: please check wellness and lipid - for v70.0 and 272 and 401.1 thanks   ---- 09/12/2010 10:54 AM, Liane Comber CMA (AAMA) wrote: Lab orders please! Good Morning! This pt is scheduled for cpx labs Wed, which labs to draw and dx codes to use? Thanks Tasha ------------------------------

## 2010-10-03 NOTE — Progress Notes (Signed)
Summary: nos appt  Phone Note Call from Patient   Caller: [email or web address]  Call For: clance Summary of Call: Rsc nos from 8/8 to 8/24 @ 3p, pt states he thought his appt was 8/11, he apologizes for the mix-up. Initial call taken by: Darletta Moll,  April 09, 2010 9:36 AM

## 2010-10-03 NOTE — Miscellaneous (Signed)
Summary: flu vaccine at [facility]   Clinical Lists Changes  Observations: Added new observation of FLU VAX: Historical (04/23/2010 15:30)      Influenza Immunization History:    Influenza # 1:  Historical (04/23/2010) Pt was given flu vaccine at [facility] in [place].             Lowella Petties CMA  April 24, 2010 3:31 PM

## 2010-10-09 NOTE — Assessment & Plan Note (Signed)
Summary: CPX / LFW   Vital Signs:  Patient profile:   50 year old male Height:      70.25 inches Weight:      277 pounds BMI:     39.61 Temp:     99.2 degrees F oral Pulse rate:   88 / minute Pulse rhythm:   regular BP sitting:   130 / 78  (left arm) Cuff size:   large  Vitals Entered By: Lewanda Rife LPN (September 23, 2010 2:38 PM) CC: CPX and slight sinus drainage   History of Present Illness: here for wellness exam and to review chronic health problems also c/o sinus drainage  had some sniffles over the weekend - nothing big just aggrivating  99.2 -- ? if fever at home   otherwise feeling good and nothing new    wt is stable at bmi of 39 did weight watchers for a while - for several months  he would like to go back to it  it did teach he and his wife a lot  not exercising is a big problem and eats the wrong foods -- eating out  perhaps needs to cut portions just a bit   does want to start walking for exercise  has a walking track at new job - when the weather clears  has x box -- dance videos    130/78-- bp is stable on lotrel   lipids trig 125 and HDL 29 (low) and LDL 109 (that is up from 80) thinks that is from a diet change - returning to old habits    colonosc 11/08- due in 2013 for family hx and polyps occasional hemorroids / fissure   no prostate problems at all  no nocturia   td 04  did get flu shot this year   OCD / mood-- is very good on current med   Allergies: 1)  ! Zoloft 2)  ! * Shellfish 3)  Amlodipine Besy-Benazepril Hcl (Amlodipine Besy-Benazepril Hcl)  Past History:  Past Medical History: Last updated: 01/01/2010  OBSTRUCTIVE SLEEP APNEA (ICD-327.23) ACTINIC SKIN DAMAGE (ICD-692.70) OBSESSIVE-COMPULSIVE DISORDER (ICD-300.3) ANXIETY STATE NOS (ICD-300.00) Hx of SLEEP APNEA (ICD-780.57) Hx of ONYCHOMYCOSIS (ICD-110.1) HYPERTENSION (ICD-401.9) HYPERLIPIDEMIA (ICD-272.4) seasonal allergic rhinitis     Past Surgical  History: Last updated: 08/04/2007 Cyst on hand (1994) Colonoscopy- polyps (07/2001),  polyp (07/2004) colonoscopy 11/.08 nl  Family History: Last updated: 01/13/2007 Father: HTN, DM2 Mother: Deceased- colon cancer Siblings:   Social History: Last updated: 09/23/2010 Marital Status: Married non smoker 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 Eyes:  Denies blurring and eye irritation. ENT:  Complains of nasal congestion and postnasal drainage; denies sinus pressure. CV:  Denies chest pain or discomfort, lightheadness, palpitations, and shortness of breath with exertion. Resp:  Denies cough, shortness of breath, and wheezing. GI:  Denies abdominal pain, change in bowel habits, constipation, indigestion, nausea, and vomiting. GU:  Denies dysuria, nocturia, urinary frequency, and urinary hesitancy. MS:  Denies muscle aches and cramps. Derm:  Denies itching, lesion(s), poor wound healing, and rash. Neuro:  Denies headaches, numbness, and tingling. Psych:  Denies anxiety and depression. Endo:  Denies excessive thirst and excessive urination. Heme:  Denies abnormal bruising and bleeding.  Physical Exam  General:  overweight  but generally well appearing  Head:  normocephalic, atraumatic, and no abnormalities observed.  no sinus tenderness  Eyes:  vision grossly intact, pupils equal, pupils round, and pupils reactive to light.  no conjunctival pallor, injection or icterus  Ears:  R ear normal and L ear normal.  scant cerumen bilat  Nose:  nares are boggy and slt injected  Mouth:  pharynx pink and moist, no erythema, and no exudates.   Neck:  supple with full rom and no masses or thyromegally, no JVD or  carotid bruit  Chest Wall:  No deformities, masses, tenderness or gynecomastia noted. Lungs:  Normal respiratory effort, chest expands symmetrically. Lungs are clear to auscultation, no crackles or wheezes. Heart:  Normal rate and regular rhythm. S1 and S2 normal without gallop, murmur, click, rub or other extra sounds. Abdomen:  Bowel sounds positive,abdomen soft and non-tender without masses, organomegaly or hernias noted. obese abdomen  Rectal:  No external abnormalities noted. Normal sphincter tone. No rectal masses or tenderness. Prostate:  Prostate gland firm and smooth, no enlargement, nodularity, tenderness, mass, asymmetry or induration. Msk:  No deformity or scoliosis noted of thoracic or lumbar spine.  no acute joint changes  Pulses:  R and L carotid,radial,femoral,dorsalis pedis and posterior tibial pulses are full and equal bilaterally Extremities:  No clubbing, cyanosis, edema, or deformity noted with normal full range of motion of all joints.   Neurologic:  sensation intact to light touch, gait normal, and DTRs symmetrical and normal.   Skin:  Intact without suspicious lesions or rashes few angiomas on back  Cervical Nodes:  No lymphadenopathy noted Inguinal Nodes:  No significant adenopathy Psych:  normal affect, talkative and pleasant  mood is good    Impression & Recommendations:  Problem # 1:  HEALTH MAINTENANCE EXAM (ICD-V70.0) Assessment Comment Only reviewed health habits including diet, exercise and skin cancer prevention reviewed health maintenance list and family history stressed imp of wt loss wellness labs rev in detail DRE wnl  Problem # 2:  OBSESSIVE-COMPULSIVE DISORDER (ICD-300.3) Assessment: Unchanged doing very well on lexapro without change also less stressful job   Problem # 3:  HYPERTENSION (ICD-401.9) Assessment: Unchanged  fair control with brand name lotrel no change lab reviewed disc low salt diet  His updated medication list for this  problem includes:    Lotrel 5-10 Mg Caps (Amlodipine besy-benazepril hcl) .Marland Kitchen... Take one by mouth daily brand only  BP today: 130/78 Prior BP: 110/68 (04/24/2010)  Prior 10 Yr Risk Heart Disease: 4 % (01/01/2010)  Labs Reviewed: K+: 4.4 (09/18/2010) Creat: : 1.0 (09/18/2010)   Chol: 164 (09/18/2010)   HDL: 29.80 (09/18/2010)   LDL: 109 (09/18/2010)   TG: 125.0 (09/18/2010)  Orders: Prescription Created Electronically [phone])  Problem # 4:  HYPERLIPIDEMIA (ICD-272.4) Assessment: Deteriorated  this is up from poor diet rev low sat fat diet  re check 6 mo and f/u  His updated medication list for this problem includes:    Zocor 20 Mg Tabs (Simvastatin) .Marland Kitchen... Take one by mouth daily  Labs Reviewed: SGOT: 24 (09/18/2010)   SGPT: 38 (09/18/2010)  Prior 10 Yr Risk Heart Disease: 4 % (01/01/2010)   HDL:29.80 (09/18/2010), 27.10 (12/24/2009)  LDL:109 (09/18/2010), 80 (62/13/0865)  Chol:164 (09/18/2010), 133 (12/24/2009)  Trig:125.0 (09/18/2010), 131.0 (12/24/2009)  Orders: Prescription Created Electronically [id number])  Problem # 5:  VIRAL URI (ICD-465.9) Assessment: New recommend sympt care- see pt instructions   pt advised to update me if symptoms worsen or do not improve  His updated  medication list for this problem includes:    Aspirin 81 Mg Tbec (Aspirin) .Marland Kitchen... Take one by mouth a day  Complete Medication List: 1)  Zocor 20 Mg Tabs (Simvastatin) .... Take one by mouth daily 2)  Aspirin 81 Mg Tbec (Aspirin) .... Take one by mouth a day 3)  Lotrel 5-10 Mg Caps (Amlodipine besy-benazepril hcl) .... Take one by mouth daily brand only 4)  Lexapro 20 Mg Tabs (Escitalopram oxalate) .... One by mouth daily 5)  Multivitamins Tabs (Multiple vitamin) .... Take 1 tablet by mouth once a day 6)  Fish Oil Caps  .... One by mouth daily 7)  Astepro 0.15 % Soln (Azelastine hcl) .... As needed 8)  Flonase 50 Mcg/act Susp (Fluticasone propionate) .... 2 sprays in each nostril once daily as  needed 9)  Anusol-hc 25 Mg Supp (Hydrocortisone acetate) .Marland Kitchen.. 1 per rectum at bedtime for 7 days as needed for rectal pain or bleeding 10)  Allegra Otc  .... Take as directed 11)  Melatonin 3 Mg Tabs (Melatonin) .... Otc as directed.  Patient Instructions: 1)  you can try mucinex over the counter twice daily as directed and nasal saline spray for congestion 2)  tylenol over the counter as directed may help with aches, headache and fever 3)  call if symptoms worsen or if not improved in 4-5 days  4)  work hard on healthy diet and exercise for weight loss 5)  blood pressure is ok today and cholesterol is up  6)  schedule fasting labs and then follow up in 6 months lipid/ast/alt/ renal  272 , 401.1 Prescriptions: FLONASE 50 MCG/ACT SUSP (FLUTICASONE PROPIONATE) 2 sprays in each nostril once daily as needed  #1 mdi x 11   Entered and Authorized by:   Judith Part MD   Signed by:   Judith Part MD on 09/23/2010   Method used:   Electronically to        [facility]. # [id number]* (retail)       [address]       [place], [place]  [address]       Ph: [phone]       Fax: [phone]   RxID:   [id number] LEXAPRO 20 MG TABS (ESCITALOPRAM OXALATE) one by mouth daily  #30 x 11   Entered and Authorized by:   Judith Part MD   Signed by:   Judith Part MD on 09/23/2010   Method used:   Electronically to        [facility]. # [id number]* (retail)       [address]       [place], [place]  [address]       Ph: [phone]       Fax: [phone]   RxID:   [id number] LOTREL 5-10 MG CAPS (AMLODIPINE BESY-BENAZEPRIL HCL) Take one by mouth daily Brand only Brand medically necessary #30 x 11   Entered and Authorized by:   Judith Part MD   Signed by:   Judith Part MD on 09/23/2010   Method used:   Electronically to        [facility]. # [id number]* (retail)       [address]       [place], [place]  [address]  Ph:  [phone]       Fax: [phone]   RxID:   [id number] ZOCOR 20 MG TABS (SIMVASTATIN) Take one by mouth daily  #30 x 11   Entered and Authorized by:   Judith Part MD   Signed by:   Judith Part MD on 09/23/2010   Method used:   Electronically to        [facility]. # [id number]* (retail)       [address]       [place], [place]  [address]       Ph: [phone]       Fax: [phone]   RxID:   [id number]    Orders Added: 1)  Prescription Created Electronically [G8553] 2)  Est. Patient 40-64 years [99396] 3)  Est. Patient Level II [88416]    Current Allergies (reviewed today): ! ZOLOFT ! * SHELLFISH AMLODIPINE BESY-BENAZEPRIL HCL (AMLODIPINE BESY-BENAZEPRIL HCL)

## 2010-10-23 ENCOUNTER — Encounter: Payer: Self-pay | Admitting: Family Medicine

## 2010-10-23 ENCOUNTER — Ambulatory Visit (INDEPENDENT_AMBULATORY_CARE_PROVIDER_SITE_OTHER): Payer: Self-pay | Admitting: Family Medicine

## 2010-10-23 DIAGNOSIS — J209 Acute bronchitis, unspecified: Secondary | ICD-10-CM

## 2010-10-29 ENCOUNTER — Encounter: Payer: Self-pay | Admitting: Family Medicine

## 2010-10-29 ENCOUNTER — Telehealth: Payer: Self-pay | Admitting: Family Medicine

## 2010-10-29 NOTE — Assessment & Plan Note (Signed)
Summary: CONGESTION,RUNNY NOSE,FEVER,COUGH/CLE   Vital Signs:  Patient profile:   50 year old male Weight:      283 pounds Temp:     98.5 degrees F oral Pulse rate:   84 / minute Pulse rhythm:   regular BP sitting:   124 / 82  (left arm) Cuff size:   large  Vitals Entered By: Selena Batten Dance CMA (AAMA) (October 23, 2010 10:28 AM) CC: Cough/congestion   History of Present Illness: CC: cough/congestion  9 d h/o drainage as well as runny nose.  Over weekend seemed to move into chest, feverish, cough.  monday felt better but continued clear drainage.  Each day getting worse this week. Last night worsening cough, rattly, coughing green sputum up as well as purulent nasal discharge.  Trouble laying flat 2/2 congestion.  Took allegra D and theraflu today.  helping some.  feels like "walking PNA" (h/o same several years ago).  Having trouble sleeing 2/2 cough as well as coughing fits.  + body aches.  + HA and ST.  + L ear muffled.  No abd pain, n/v/d, rashes, myalgias, arthralgias.    + wife sick at home.  no smokers at home, no h/o asthma.  Current Medications (verified): 1)  Zocor 20 Mg Tabs (Simvastatin) .... Take One By Mouth Daily 2)  Aspirin 81 Mg Tbec (Aspirin) .... Take One By Mouth A Day 3)  Lotrel 5-10 Mg Caps (Amlodipine Besy-Benazepril Hcl) .... Take One By Mouth Daily Brand Only 4)  Lexapro 20 Mg Tabs (Escitalopram Oxalate) .... One By Mouth Daily 5)  Multivitamins   Tabs (Multiple Vitamin) .... Take 1 Tablet By Mouth Once A Day 6)  Fish Oil Caps .... One By Mouth Daily 7)  Astepro 0.15 % Soln (Azelastine Hcl) .... As Needed 8)  Flonase 50 Mcg/act Susp (Fluticasone Propionate) .... 2 Sprays in Each Nostril Once Daily As Needed 9)  Anusol-Hc 25 Mg Supp (Hydrocortisone Acetate) .Marland Kitchen.. 1 Per Rectum At Bedtime For 7 Days As Needed For Rectal Pain or Bleeding 10)  Allegra Otc .... Take As Directed 11)  Melatonin 3 Mg Tabs (Melatonin) .... Otc As Directed.  Allergies: 1)  ! Zoloft 2)   ! 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  congested Head:  normocephalic, atraumatic, and no abnormalities observed.  no sinus tenderness  Eyes:  vision grossly intact, pupils equal, pupils round, and pupils reactive to light.  no conjunctival pallor, injection or icterus  Ears:  R ear normal and L ear normal.  L TM covered by cerumen Nose:  nares are boggy and slt injected  Mouth:  pharynx pink and moist, no erythema, and no exudates.   Neck:  supple with full rom and no masses or thyromegally, no JVD or carotid bruit .  no LAD Lungs:  Normal respiratory effort, chest expands symmetrically. Lungs are clear to auscultation, no crackles or wheezes. Heart:  Normal rate and regular rhythm.  S1 and S2 normal without gallop, murmur, click, rub or other extra sounds. Pulses:  2+ rad pulses, brisk cap refill Extremities:  no c/c/e   Impression & Recommendations:  Problem # 1:  ACUTE BRONCHITIS (ICD-466.0) Assessment New given worsening after initially improving, treat with zpack.  tussionex for cough at night.  red flags to return discussed.  flonase for sinus congestion.  His updated medication list for this problem includes:    Zithromax Z-pak 250 Mg Tabs (Azithromycin) ..... Use as directed    Tussionex  Pennkinetic Er 10-8 Mg/66ml Lqcr (Hydrocod polst-chlorphen polst) ..... One teaspoon at bedtime as needed cough  Complete Medication List: 1)  Zocor 20 Mg Tabs (Simvastatin) .... Take one by mouth daily 2)  Aspirin 81 Mg Tbec (Aspirin) .... Take one by mouth a day 3)  Lotrel 5-10 Mg Caps (Amlodipine besy-benazepril hcl) .... Take one by mouth daily brand only 4)  Lexapro 20 Mg Tabs (Escitalopram oxalate) .... One by mouth daily 5)  Multivitamins Tabs (Multiple vitamin) .... Take 1 tablet by mouth once a day 6)  Fish Oil Caps  .... One by mouth daily 7)  Astepro 0.15 % Soln (Azelastine hcl) .... As needed 8)  Flonase 50 Mcg/act Susp (Fluticasone propionate) .... 2 sprays in each nostril once daily as needed 9)  Anusol-hc 25 Mg Supp (Hydrocortisone acetate) .Marland Kitchen.. 1 per rectum at bedtime for 7 days as needed for rectal pain or bleeding 10)  Allegra Otc  .... Take as directed 11)  Melatonin 3 Mg Tabs (Melatonin) .... Otc as directed. 12)  Zithromax Z-pak 250 Mg Tabs (Azithromycin) .... Use as directed 13)  Tussionex Pennkinetic Er 10-8 Mg/76ml Lqcr (Hydrocod polst-chlorphen polst) .... One teaspoon at bedtime as needed cough  Patient Instructions: 1)  Start flonase daily. 2)  sounds like bronchitis going on and since worsening, treat with zpack.  3)  Cough syrup at night: cheratussin 4)  Continue mucinex with plenty of fluid. 5)  Consider saline nasal spray or neti pot to drain sinuses. 6)  if worsening or any fevers >101.5 or worse cough, trouble breathing, let us know. 7)  Good to se you today, call clinic with quesitons Prescriptions: TUSSIONEX PENNKINETIC ER 10-8 MG/5ML LQCR (HYDROCOD POLST-CHLORPHEN POLST) one teaspoon at bedtime as needed cough  #100cc x 0   Entered and Authorized by:   Eustaquio Boyden  MD   Signed by:   Eustaquio Boyden  MD on 10/23/2010   Method used:   Print then Give to Patient   RxID:   [id number] Christena Deem Z-PAK 250 MG TABS (AZITHROMYCIN) use as directed   #1 x 0   Entered and Authorized by:   Eustaquio Boyden  MD   Signed by:   Eustaquio Boyden  MD on 10/23/2010   Method used:   Electronically to        [facility]. # [phone]* (retail)       [address]       [place], [place]  [address]       Ph: [phone]       Fax: [phone]   RxID:   [id number]    Orders Added: 1)  Est. Patient Level III [09983]    Current Allergies (reviewed today): ! ZOLOFT ! * SHELLFISH AMLODIPINE BESY-BENAZEPRIL HCL (AMLODIPINE BESY-BENAZEPRIL HCL)

## 2010-11-07 NOTE — Progress Notes (Signed)
Summary: prior auth needed for lexapro  Phone Note From Pharmacy   Caller: [facility] [address] # [id number]*/  [employer] Summary of Call: Prior auth is needed for lexapro, form is on your shelf.                  Lowella Petties CMA, AAMA  October 29, 2010 9:47 AM   Follow-up for Phone Call        form done and in nurse in box  Follow-up by: Judith Part MD,  October 29, 2010 11:20 AM  Additional Follow-up for Phone Call Additional follow up Details #1::        completed form faxed to [phone] as instructed. form given to [place].Lewanda Rife LPN  October 29, 2010 2:40 PM   Prior Berkley Harvey given, advised pharmacy.  Approval letter placed on doctor's desk for signature and scanning. Additional Follow-up by: Lowella Petties CMA, AAMA,  October 30, 2010 11:48 AM

## 2010-11-11 ENCOUNTER — Encounter: Payer: Self-pay | Admitting: Family Medicine

## 2010-11-11 ENCOUNTER — Telehealth: Payer: Self-pay | Admitting: Family Medicine

## 2010-11-12 NOTE — Medication Information (Signed)
Summary: [employer] Approval  [employer] Approval   Imported By: Kassie Mends 11/04/2010 11:20:30  _____________________________________________________________________  External Attachment:    Type:   Image     Comment:   External Document

## 2010-11-19 NOTE — Progress Notes (Signed)
Summary: prior auth needed for lotrel  Phone Note From Pharmacy   Caller: [facility] Action Taken: Prescription resent Summary of Call: Prior Berkley Harvey is needed for lotrel, form is on your shelf.               Lowella Petties CMA, AAMA  November 11, 2010 8:59 AM   Follow-up for Phone Call        form done and in nurse in box please scan it  Follow-up by: Judith Part MD,  November 11, 2010 1:46 PM  Additional Follow-up for Phone Call Additional follow up Details #1::        Completed form faxed to [phone] as instructed. Form given to [place].Lewanda Rife LPN  November 11, 2010 2:46 PM   Prior Berkley Harvey given for lotrel, approval letter placed on doctor's desk for signature and scanning. Additional Follow-up by: Lowella Petties CMA, AAMA,  November 12, 2010 8:07 AM

## 2010-11-19 NOTE — Medication Information (Signed)
Summary: [employer] Prior Auth Approval for Lotrel Capsule from 10/21/10-3/12/1  [employer] Prior Auth Approval for Lotrel Capsule from 10/21/10-11/11/11   Imported By: Beau Fanny 11/12/2010 16:13:44  _____________________________________________________________________  External Attachment:    Type:   Image     Comment:   External Document

## 2011-01-20 ENCOUNTER — Other Ambulatory Visit: Payer: Self-pay | Admitting: *Deleted

## 2011-01-20 MED ORDER — ESCITALOPRAM OXALATE 20 MG PO TABS: 20.0000 mg | ORAL_TABLET | Freq: Every day | ORAL | Status: DC

## 2011-01-20 MED ORDER — AMLODIPINE BESY-BENAZEPRIL HCL 5-10 MG PO CAPS: 1.0000 | ORAL_CAPSULE | Freq: Every day | ORAL | Status: DC

## 2011-01-20 MED ORDER — SIMVASTATIN 20 MG PO TABS: 20.0000 mg | ORAL_TABLET | Freq: Every day | ORAL | Status: DC

## 2011-01-20 NOTE — Telephone Encounter (Signed)
Forms faxed to [employer]

## 2011-01-20 NOTE — Telephone Encounter (Signed)
Pt is changing to [facility] and will need new scripts sent in.  He is asking for brand name lotrel, says insurance will pay for it now.  Forms from [facility] are on your shelf.

## 2011-01-20 NOTE — Telephone Encounter (Signed)
Forms done in IN box Please note refils on med list-thanks

## 2011-03-14 ENCOUNTER — Other Ambulatory Visit: Payer: Self-pay | Admitting: Family Medicine

## 2011-03-14 ENCOUNTER — Encounter: Payer: Self-pay | Admitting: Family Medicine

## 2011-03-14 DIAGNOSIS — E7800 Pure hypercholesterolemia, unspecified: Secondary | ICD-10-CM

## 2011-03-14 DIAGNOSIS — I10 Essential (primary) hypertension: Secondary | ICD-10-CM

## 2011-03-18 ENCOUNTER — Other Ambulatory Visit (INDEPENDENT_AMBULATORY_CARE_PROVIDER_SITE_OTHER): Payer: Managed Care, Other (non HMO) | Admitting: Family Medicine

## 2011-03-18 DIAGNOSIS — I10 Essential (primary) hypertension: Secondary | ICD-10-CM

## 2011-03-18 DIAGNOSIS — E7800 Pure hypercholesterolemia, unspecified: Secondary | ICD-10-CM

## 2011-03-18 LAB — RENAL FUNCTION PANEL
Albumin: 4.3 g/dL (ref 3.5–5.2)
GFR: 87.74 mL/min (ref >60.00)
Glucose, Bld: 86 mg/dL (ref 70–99)
Phosphorus: 2.8 mg/dL (ref 2.3–4.6)
Potassium: 4.3 mEq/L (ref 3.5–5.1)
Sodium: 140 mEq/L (ref 135–145)

## 2011-03-18 LAB — AST: AST: 28 U/L (ref 0–37)

## 2011-03-18 LAB — ALT: ALT: 47 U/L (ref 0–53)

## 2011-03-18 LAB — LIPID PANEL: Total CHOL/HDL Ratio: 4

## 2011-03-24 ENCOUNTER — Encounter: Payer: Self-pay | Admitting: Family Medicine

## 2011-03-24 ENCOUNTER — Ambulatory Visit (INDEPENDENT_AMBULATORY_CARE_PROVIDER_SITE_OTHER): Payer: Managed Care, Other (non HMO) | Admitting: Family Medicine

## 2011-03-24 DIAGNOSIS — E785 Hyperlipidemia, unspecified: Secondary | ICD-10-CM

## 2011-03-24 DIAGNOSIS — E669 Obesity, unspecified: Secondary | ICD-10-CM

## 2011-03-24 DIAGNOSIS — I10 Essential (primary) hypertension: Secondary | ICD-10-CM

## 2011-03-24 NOTE — Patient Instructions (Signed)
Go ahead and take your simvastatin with dinner  Cholesterol is better  Invest in a fitting for good shoes for athletics Work on more exercise and a diet overhaul (I really recommend weight watchers )  Schedule PE in 6 months with labs prior

## 2011-03-24 NOTE — Progress Notes (Signed)
Subjective:    Patient ID: Jeffrey Hartman, male    DOB: 1960/11/24, 50 y.o.   MRN: [id number]  HPI Here for f/u of hyperlipidemia (on zocor) and HTN and obesity  Wt is up another 6 lb Is trying to eat healthy too  Salads twice per week during lunch  Choosing good things  Tries to stay away from fried foods if he can   bp is stable on brand lotrel 136/82 Exercise-- has been walking in the ams -walking track at work  Walking 1 mile up to 5 days per week  Takes him about 15 minutes  Some problems with shin splints - really bad at times  Not using ice  No swelling  For breakfast eats small bowl of grits  Not a big fruit eater   For dinner - often subway or hamburger , pizza   He tends to break out into a sweat after he takes his lexapro  Also having sexual side effects - less desire and stamina  Ins will not pay for the DAW lotrel  He has to pay 40$ and does not want to switch   There are nights when it is hard to sleep on 3 mg melatonin -- and wants to know if he can increase dose at all   [facility] and also omega 3    Glucose good at 86  Lipids improved LDL is 80 Lab Results  Component Value Date   CHOL 149 03/18/2011   CHOL 164 09/18/2010   CHOL 133 12/24/2009   Lab Results  Component Value Date   HDL 33.30* 03/18/2011   HDL 29.80* 09/18/2010   HDL 27.10* 12/24/2009   Lab Results  Component Value Date   LDLCALC 80 03/18/2011   LDLCALC 109* 09/18/2010   LDLCALC 80 12/24/2009   Lab Results  Component Value Date   TRIG 181.0* 03/18/2011   TRIG 125.0 09/18/2010   TRIG 131.0 12/24/2009   Lab Results  Component Value Date   CHOLHDL 4 03/18/2011   CHOLHDL 6 09/18/2010   CHOLHDL 5 12/24/2009   Lab Results  Component Value Date   LDLDIRECT 109.0 09/21/2009     Patient Active Problem List  Diagnoses  . ONYCHOMYCOSIS  . HYPERLIPIDEMIA  . OBESITY  . OBSESSIVE-COMPULSIVE DISORDER  . OBSTRUCTIVE SLEEP APNEA  . HYPERTENSION  . ALLERGIC RHINITIS  . ACTINIC SKIN  DAMAGE   Past Medical History  Diagnosis Date  . Obstructive sleep apnea (adult) (pediatric)   . Unspecified dermatitis due to sun   . Obsessive-compulsive disorders   . Anxiety states   . Unspecified sleep apnea   . Dermatophytosis of nail   . Unspecified essential hypertension   . Other and unspecified hyperlipidemia   . Seasonal allergic rhinitis    Past Surgical History  Procedure Date  . Cystectomy     cyst on hand  . Colonoscopy 11/02, 11/05    polyps   History  Substance Use Topics  . Smoking status: Never Smoker   . Smokeless tobacco: Not on file  . Alcohol Use: Not on file   Family History  Problem Relation Age of Onset  . Hypertension Father   . Diabetes type II Father   . Colon cancer Mother    Allergies  Allergen Reactions  . Amlodipine Besy-Benazepril Hcl     REACTION: Generic med did not adequatly control htnh  . Sertraline Hcl     REACTION: nausea, ED   Current Outpatient Prescriptions on File Prior  to Visit  Medication Sig Dispense Refill  . amLODipine-benazepril (LOTREL) 5-10 MG per capsule Take 1 capsule by mouth daily.  90 capsule  3  . aspirin 81 MG tablet Take 81 mg by mouth daily.        Marland Kitchen escitalopram (LEXAPRO) 20 MG tablet Take 1 tablet (20 mg total) by mouth daily.  90 tablet  3  . fish oil-omega-3 fatty acids 1000 MG capsule Take 1 g by mouth daily.       . Melatonin 3 MG TABS Take 1 tablet by mouth as directed.        . Multiple Vitamin (MULTIVITAMIN) tablet Take 1 tablet by mouth daily.        . simvastatin (ZOCOR) 20 MG tablet Take 1 tablet (20 mg total) by mouth daily.  90 tablet  3  . Azelastine HCl (ASTEPRO) 0.15 % SOLN as needed.        Marland Kitchen Fexofenadine HCl (ALLEGRA PO) Take by mouth. As directed        . fluticasone (FLONASE) 50 MCG/ACT nasal spray Place 2 sprays into the nose daily as needed.        . hydrocortisone (ANUSOL-HC) 25 MG suppository Place 25 mg rectally every evening. As needed for 7 days as needed for rectal pain or  bleeding           Review of Systems Review of Systems  Constitutional: Negative for fever, appetite change, and unexpected weight change. pos for fatigue from schedule  Eyes: Negative for pain and visual disturbance.  Respiratory: Negative for cough and shortness of breath.   Cardiovascular: Negative.  For cp or sob or palp Gastrointestinal: Negative for nausea, diarrhea and constipation.  Genitourinary: Negative for urgency and frequency.  Skin: Negative for pallor. or rash  Neurological: Negative for weakness, light-headedness, numbness and headaches.  Hematological: Negative for adenopathy. Does not bruise/bleed easily.  Psychiatric/Behavioral: Negative for dysphoric mood. The patient is not nervous/anxious.  pos for insomnia         Objective:   Physical Exam  Constitutional: He appears well-developed and well-nourished. No distress.       overwt and well appearing   HENT:  Head: Normocephalic and atraumatic.  Right Ear: External ear normal.  Left Ear: External ear normal.  Nose: Nose normal.  Mouth/Throat: Oropharynx is clear and moist.  Eyes: Conjunctivae and EOM are normal. Pupils are equal, round, and reactive to light.  Neck: Normal range of motion. Neck supple. No JVD present. Carotid bruit is not present. No thyromegaly present.  Cardiovascular: Normal rate, regular rhythm, normal heart sounds and intact distal pulses.   Pulmonary/Chest: Effort normal and breath sounds normal. No respiratory distress. He has no wheezes.  Abdominal: Soft. Bowel sounds are normal. He exhibits no distension and no mass. There is no tenderness.  Musculoskeletal: Normal range of motion. He exhibits no edema and no tenderness.  Lymphadenopathy:    He has no cervical adenopathy.  Neurological: He is alert. He has normal reflexes. No cranial nerve deficit. Coordination normal.  Skin: Skin is warm and dry. No rash noted. No erythema. No pallor.  Psychiatric: He has a normal mood and affect.        Seems mildly anxious Pleasant and talkative           Assessment & Plan:

## 2011-03-27 NOTE — Assessment & Plan Note (Signed)
Well controlled with daw lotrel Pt plans to continue this until he can no longer afford it  Does not tol the generic / and not effective

## 2011-03-27 NOTE — Assessment & Plan Note (Signed)
Improved with better diet Rev lab with pt Rev his diet Rev low sat fat diet and improvements to make

## 2011-03-27 NOTE — Assessment & Plan Note (Signed)
Long disc of obesity and health risks from that  Rev diet and lifestyle habits in detail Made plan for 5 d exercise per week and better diet rec wt watchers Unsure how motivated pt is

## 2011-08-08 ENCOUNTER — Institutional Professional Consult (permissible substitution): Payer: Managed Care, Other (non HMO) | Admitting: Pulmonary Disease

## 2011-08-15 ENCOUNTER — Encounter: Payer: Self-pay | Admitting: Pulmonary Disease

## 2011-08-15 ENCOUNTER — Ambulatory Visit (INDEPENDENT_AMBULATORY_CARE_PROVIDER_SITE_OTHER): Payer: Managed Care, Other (non HMO) | Admitting: Pulmonary Disease

## 2011-08-15 VITALS — BP 112/60 | HR 92 | Temp 98.3°F | Ht 72.0 in | Wt 295.0 lb

## 2011-08-15 DIAGNOSIS — G4733 Obstructive sleep apnea (adult) (pediatric): Secondary | ICD-10-CM

## 2011-08-15 NOTE — Assessment & Plan Note (Signed)
The patient is doing very well on his current CPAP setup.  He denies any issues with his mask fit, but feels that he is due for a new machine.  We'll check with insurance on this.  He is not having any mask fit issues, and feels that he sleeps well with excellent daytime alertness.  I have encouraged him to work aggressively on weight loss.

## 2011-08-15 NOTE — Patient Instructions (Signed)
Will get you a new cpap machine if ok with insurance Work on weight loss followup with me in one year if doing well.

## 2011-08-15 NOTE — Progress Notes (Signed)
  Subjective:    Patient ID: Jeffrey Hartman, male    DOB: [birth year], 50 y.o.   MRN: [id number]  HPI Patient comes in today for followup of his known obstructive sleep apnea.  He is wearing CPAP compliantly, and is having no issues with mask fit.  He is sleeping well, and feels rested the next day.  He denies any daytime alertness issues.  He does have a fairly old machine, and is probably due for replacement.   Review of Systems  Constitutional: Negative for fever and unexpected weight change.  HENT: Negative for ear pain, nosebleeds, congestion, sore throat, rhinorrhea, sneezing, trouble swallowing, dental problem, postnasal drip and sinus pressure.   Eyes: Negative for redness and itching.  Respiratory: Negative for cough, chest tightness, shortness of breath and wheezing.   Cardiovascular: Negative for palpitations and leg swelling.  Gastrointestinal: Negative for nausea and vomiting.  Genitourinary: Negative for dysuria.  Musculoskeletal: Negative for joint swelling.  Skin: Negative for rash.  Neurological: Negative for headaches.  Hematological: Does not bruise/bleed easily.  Psychiatric/Behavioral: Negative for dysphoric mood. The patient is not nervous/anxious.        Objective:   Physical Exam Obese male in no acute distress No skin breakdown or pressure necrosis from the CPAP mask Lower extremities without edema, no cyanosis Alert, not sleepy, but soft 4 extremities.       Assessment & Plan:

## 2011-08-19 ENCOUNTER — Telehealth: Payer: Self-pay | Admitting: Pulmonary Disease

## 2011-08-19 NOTE — Telephone Encounter (Signed)
Pt wanted to make sure his new cpap was to have humidity on it and i made him aware it was ordered

## 2011-09-17 ENCOUNTER — Telehealth: Payer: Self-pay | Admitting: Family Medicine

## 2011-09-17 ENCOUNTER — Other Ambulatory Visit (INDEPENDENT_AMBULATORY_CARE_PROVIDER_SITE_OTHER): Payer: Managed Care, Other (non HMO)

## 2011-09-17 DIAGNOSIS — Z0000 Encounter for general adult medical examination without abnormal findings: Secondary | ICD-10-CM

## 2011-09-17 DIAGNOSIS — E785 Hyperlipidemia, unspecified: Secondary | ICD-10-CM

## 2011-09-17 LAB — COMPREHENSIVE METABOLIC PANEL WITH GFR
ALT: 49 U/L (ref 0–53)
AST: 29 U/L (ref 0–37)
Albumin: 4.1 g/dL (ref 3.5–5.2)
Alkaline Phosphatase: 52 U/L (ref 39–117)
BUN: 21 mg/dL (ref 6–23)
CO2: 28 meq/L (ref 19–32)
Calcium: 9.3 mg/dL (ref 8.4–10.5)
Chloride: 103 meq/L (ref 96–112)
Creatinine, Ser: 1.2 mg/dL (ref 0.4–1.5)
GFR: 69.83 mL/min
Glucose, Bld: 89 mg/dL (ref 70–99)
Potassium: 4.7 meq/L (ref 3.5–5.1)
Sodium: 140 meq/L (ref 135–145)
Total Bilirubin: 0.7 mg/dL (ref 0.3–1.2)
Total Protein: 7.3 g/dL (ref 6.0–8.3)

## 2011-09-17 LAB — CBC WITH DIFFERENTIAL/PLATELET
Basophils Relative: 0.5 % (ref 0.0–3.0)
Eosinophils Absolute: 0.2 10*3/uL (ref 0.0–0.7)
Lymphocytes Relative: 22.9 % (ref 12.0–46.0)
MCHC: 34.3 g/dL (ref 30.0–36.0)
MCV: 87.3 fl (ref 78.0–100.0)
Monocytes Absolute: 0.6 10*3/uL (ref 0.1–1.0)
Neutrophils Relative %: 65 % (ref 43.0–77.0)
Platelets: 249 10*3/uL (ref 150.0–400.0)
RBC: 4.51 Mil/uL (ref 4.22–5.81)
WBC: 6.4 10*3/uL (ref 4.5–10.5)

## 2011-09-17 LAB — LIPID PANEL
LDL Cholesterol: 91 mg/dL (ref 0–99)
Total CHOL/HDL Ratio: 5

## 2011-09-17 LAB — TSH: TSH: 1.36 u[IU]/mL (ref 0.35–5.50)

## 2011-09-17 NOTE — Telephone Encounter (Signed)
Message copied by Judy Pimple on Wed Sep 17, 2011  8:05 AM ------      Message from: Baldomero Lamy      Created: Wed Sep 10, 2011  8:13 AM      Regarding: cpx labs Wed 09/17/11       Please order  future cpx labs for pt's upcomming lab appt.      Thanks      Rodney Booze

## 2011-09-24 ENCOUNTER — Encounter: Payer: Self-pay | Admitting: Family Medicine

## 2011-09-24 ENCOUNTER — Ambulatory Visit (INDEPENDENT_AMBULATORY_CARE_PROVIDER_SITE_OTHER): Payer: Managed Care, Other (non HMO) | Admitting: Family Medicine

## 2011-09-24 VITALS — BP 118/78 | HR 76 | Temp 98.3°F | Ht 72.0 in | Wt 293.5 lb

## 2011-09-24 DIAGNOSIS — E669 Obesity, unspecified: Secondary | ICD-10-CM

## 2011-09-24 DIAGNOSIS — I10 Essential (primary) hypertension: Secondary | ICD-10-CM

## 2011-09-24 DIAGNOSIS — E785 Hyperlipidemia, unspecified: Secondary | ICD-10-CM

## 2011-09-24 MED ORDER — OLMESARTAN MEDOXOMIL-HCTZ 40-12.5 MG PO TABS: 1.0000 | ORAL_TABLET | Freq: Every day | ORAL | Status: DC

## 2011-09-24 MED ORDER — SIMVASTATIN 20 MG PO TABS: 20.0000 mg | ORAL_TABLET | Freq: Every day | ORAL | Status: DC

## 2011-09-24 MED ORDER — ESCITALOPRAM OXALATE 20 MG PO TABS: 20.0000 mg | ORAL_TABLET | Freq: Every day | ORAL | Status: DC

## 2011-09-24 NOTE — Progress Notes (Signed)
Subjective:    Patient ID: Jeffrey Hartman, male    DOB: 1961-06-03, 51 y.o.   MRN: [id number]  HPI Here for f/u of HTN and obesity and hyperlipidemia  Is doing fine - nothing new  Wishes he could walk more during the winter   Back in October passed another kidney stone - not too painful this time  Took a strainer home - never caught the stone  [facility] to [facility]  Needs to start with paper px   Will need colonoscopy in nov 13  Uses melatonin for sleep- sometimes helps  Sleeps better when he exercises    bp is  118/78   Today No cp or palpitations or headaches or edema  No side effects to medicines     Chemistry      Component Value Date/Time   NA 140 09/17/2011 0831   K 4.7 09/17/2011 0831   CL 103 09/17/2011 0831   CO2 28 09/17/2011 0831   BUN 21 09/17/2011 0831   CREATININE 1.2 09/17/2011 0831      Component Value Date/Time   CALCIUM 9.3 09/17/2011 0831   ALKPHOS 52 09/17/2011 0831   AST 29 09/17/2011 0831   ALT 49 09/17/2011 0831   BILITOT 0.7 09/17/2011 0831       Wt is down 1.5 lb since last visit High bmi of 39 Disc need for wt loss at last visit Diet- has had good days and bad days -- eating better the first 1/2 of the day and worse at dinner  Does not feel like cooking , eats fast food  Exercise-does not have an indoor -- has an xbox  Is not really committed yet to healthy habit    Glucose is 89 Lab Results  Component Value Date   TSH 1.36 09/17/2011   in nl range    Lab Results  Component Value Date   CHOL 154 09/17/2011   CHOL 149 03/18/2011   CHOL 164 09/18/2010   Lab Results  Component Value Date   HDL 33.10* 09/17/2011   HDL 33.30* 03/18/2011   HDL 29.80* 09/18/2010   Lab Results  Component Value Date   LDLCALC 91 09/17/2011   LDLCALC 80 03/18/2011   LDLCALC 109* 09/18/2010   Lab Results  Component Value Date   TRIG 151.0* 09/17/2011   TRIG 181.0* 03/18/2011   TRIG 125.0 09/18/2010   Lab Results  Component Value Date   CHOLHDL 5  09/17/2011   CHOLHDL 4 03/18/2011   CHOLHDL 6 09/18/2010   Lab Results  Component Value Date   LDLDIRECT 109.0 09/21/2009   had not eaten optimally before the lab draw   Patient Active Problem List  Diagnoses  . ONYCHOMYCOSIS  . HYPERLIPIDEMIA  . OBESITY  . OBSESSIVE-COMPULSIVE DISORDER  . OBSTRUCTIVE SLEEP APNEA  . HYPERTENSION  . ALLERGIC RHINITIS  . ACTINIC SKIN DAMAGE  . Routine general medical examination at a health care facility   Past Medical History  Diagnosis Date  . Obstructive sleep apnea (adult) (pediatric)   . Unspecified dermatitis due to sun   . Obsessive-compulsive disorders   . Anxiety states   . Unspecified sleep apnea   . Dermatophytosis of nail   . Unspecified essential hypertension   . Other and unspecified hyperlipidemia   . Seasonal allergic rhinitis    Past Surgical History  Procedure Date  . Cystectomy     cyst on hand  . Colonoscopy 11/02, 11/05    polyps   History  Substance Use Topics  . Smoking status: Never Smoker   . Smokeless tobacco: Not on file  . Alcohol Use: Not on file   Family History  Problem Relation Age of Onset  . Hypertension Father   . Diabetes type II Father   . Colon cancer Mother    Allergies  Allergen Reactions  . Amlodipine Besy-Benazepril Hcl     REACTION: Generic med did not adequatly control htnh  . Sertraline Hcl     REACTION: nausea, ED   Current Outpatient Prescriptions on File Prior to Visit  Medication Sig Dispense Refill  . aspirin 81 MG tablet Take 81 mg by mouth daily.        . fish oil-omega-3 fatty acids 1000 MG capsule Take 1 g by mouth daily.       . Melatonin 3 MG TABS Take 2 tablets by mouth at bedtime.       . Multiple Vitamin (MULTIVITAMIN) tablet Take 1 tablet by mouth daily.        . Azelastine HCl (ASTEPRO) 0.15 % SOLN as needed.        Marland Kitchen Fexofenadine HCl (ALLEGRA PO) Take by mouth. As directed        . fluticasone (FLONASE) 50 MCG/ACT nasal spray Place 2 sprays into the nose daily  as needed.        . hydrocortisone (ANUSOL-HC) 25 MG suppository Place 25 mg rectally every evening. As needed for 7 days as needed for rectal pain or bleeding           Review of Systems Review of Systems  Constitutional: Negative for fever, appetite change, fatigue and unexpected weight change.  Eyes: Negative for pain and visual disturbance.  Respiratory: Negative for cough and shortness of breath.   Cardiovascular: Negative for cp or palpitations    Gastrointestinal: Negative for nausea, diarrhea and constipation.  Genitourinary: Negative for urgency and frequency.  Skin: Negative for pallor or rash   Neurological: Negative for weakness, light-headedness, numbness and headaches.  Hematological: Negative for adenopathy. Does not bruise/bleed easily.  Psychiatric/Behavioral: Negative for dysphoric mood. The patient is not nervous/anxious.          Objective:   Physical Exam  Constitutional: He appears well-developed and well-nourished. No distress.       Obese and well appearing   HENT:  Head: Normocephalic and atraumatic.  Mouth/Throat: Oropharynx is clear and moist.  Eyes: Conjunctivae and EOM are normal. Pupils are equal, round, and reactive to light. No scleral icterus.  Neck: Normal range of motion. Neck supple. No JVD present. Carotid bruit is not present. No thyromegaly present.  Cardiovascular: Normal rate, regular rhythm, normal heart sounds and intact distal pulses.  Exam reveals no gallop.   Pulmonary/Chest: Effort normal and breath sounds normal. No respiratory distress. He has no wheezes. He exhibits no tenderness.  Abdominal: Soft. Bowel sounds are normal. He exhibits no distension, no abdominal bruit and no mass. There is no tenderness.  Musculoskeletal: Normal range of motion. He exhibits no edema and no tenderness.  Lymphadenopathy:    He has no cervical adenopathy.  Neurological: He is alert. He has normal reflexes. No cranial nerve deficit. He exhibits normal  muscle tone.  Skin: Skin is warm and dry. No rash noted. No erythema. No pallor.  Psychiatric: He has a normal mood and affect.          Assessment & Plan:

## 2011-09-24 NOTE — Assessment & Plan Note (Signed)
Again disc risks of obesity and need to loose wt Strategy for this - dec calories and more exercise -- although pt just does not seem committed yet  Did make alternate strategies for weather etc  Disc joining wt watchers with his wife

## 2011-09-24 NOTE — Assessment & Plan Note (Signed)
Controlled with brand name lotrel - but can no longer afford it  Change to benicar hct 40-12.5  udpate if side eff or problems Will f/u for visit and labs 2-3 wk after starting it  Disc lifestyle change in detail and need for wt loss

## 2011-09-24 NOTE — Assessment & Plan Note (Signed)
Stable on zocor Diet could be better Disc goals for lipids and reasons to control them Rev labs with pt Rev low sat fat diet in detail

## 2011-09-24 NOTE — Patient Instructions (Addendum)
Good health is hard work -- get to work!  For sleep - exercise - melatonin  And volarian are ok to try in a pinch Aim for 30 minutes of exercise 5 days per week INDOORS OR OUT- use your xbox  Stop the lotrel and switch to benicar hct  Follow up in 2-3 weeks after starting the new blood pressure medicine for visit and labs  If any side effects or problems - let me know  Colonoscopy is due next November

## 2011-09-25 ENCOUNTER — Telehealth: Payer: Self-pay

## 2011-09-25 NOTE — Telephone Encounter (Signed)
Patient notified as instructed by telephone. 

## 2011-09-25 NOTE — Telephone Encounter (Signed)
Pt picked up Benicar yesterday and pd $40.00 copay. Pt received info from insurance co. Preferred list of generic meds are Benzapril HCTZ,Captoril HCTZ,Enalapril HCTZ, Fosinopril HCTZ, pt said he has already gotten Benicar and will take that for now but when pt comes in for f/u to see Dr Milinda Antis 10/21/11 at 3:15 pt would like to discuss with Dr Milinda Antis changing to one of the generic meds. Pt also wants to know since he will not be continuing on Benicar if needs labs prior to Feb visit. pts call back at [phone] work or cell [phone]. Pt does not expect call back today. Pt will use [facility] after discusses possible med change at Feb visit.Marland Kitchen

## 2011-09-25 NOTE — Telephone Encounter (Signed)
Will do labs at his f/u visit I'm surprised because I thought on his paperwork that it showed benicar hct to be a preferred drug ...that is why I picked it  Will disc change to generic at f/u

## 2011-10-21 ENCOUNTER — Encounter: Payer: Self-pay | Admitting: Family Medicine

## 2011-10-21 ENCOUNTER — Ambulatory Visit (INDEPENDENT_AMBULATORY_CARE_PROVIDER_SITE_OTHER): Payer: Managed Care, Other (non HMO) | Admitting: Family Medicine

## 2011-10-21 VITALS — BP 118/68 | HR 80 | Temp 98.5°F | Ht 72.0 in | Wt 294.2 lb

## 2011-10-21 DIAGNOSIS — I10 Essential (primary) hypertension: Secondary | ICD-10-CM

## 2011-10-21 MED ORDER — LOSARTAN POTASSIUM-HCTZ 100-12.5 MG PO TABS: 1.0000 | ORAL_TABLET | Freq: Every day | ORAL | Status: DC

## 2011-10-21 NOTE — Patient Instructions (Signed)
Switch the benicar hct to losartan hct - you should not notice a difference- but if any problems , call  Check bp at home - report back if high or low Labs today

## 2011-10-21 NOTE — Progress Notes (Signed)
Subjective:    Patient ID: Jeffrey Hartman, male    DOB: Feb 23, 1961, 51 y.o.   MRN: [id number]  HPI Here for f/u of HTN  Last visit due to cost had to change from lotrel to benicar 40-12.5 mg   Feels fine with this medicine  Is urinating more often  No side effect bp is great  Needs to move to losartan hct for cost    bp is 118/68    Today No cp or palpitations or headaches or edema  No side effects to medicines    Wt is up 1 lb with bmi of 39  Patient Active Problem List  Diagnoses  . ONYCHOMYCOSIS  . HYPERLIPIDEMIA  . OBESITY  . OBSESSIVE-COMPULSIVE DISORDER  . OBSTRUCTIVE SLEEP APNEA  . HYPERTENSION  . ALLERGIC RHINITIS  . ACTINIC SKIN DAMAGE  . Routine general medical examination at a health care facility   Past Medical History  Diagnosis Date  . Obstructive sleep apnea (adult) (pediatric)   . Unspecified dermatitis due to sun   . Obsessive-compulsive disorders   . Anxiety states   . Unspecified sleep apnea   . Dermatophytosis of nail   . Unspecified essential hypertension   . Other and unspecified hyperlipidemia   . Seasonal allergic rhinitis    Past Surgical History  Procedure Date  . Cystectomy     cyst on hand  . Colonoscopy 11/02, 11/05    polyps   History  Substance Use Topics  . Smoking status: Never Smoker   . Smokeless tobacco: Not on file  . Alcohol Use: Not on file   Family History  Problem Relation Age of Onset  . Hypertension Father   . Diabetes type II Father   . Colon cancer Mother    Allergies  Allergen Reactions  . Amlodipine Besy-Benazepril Hcl     REACTION: Generic med did not adequatly control htnh  . Sertraline Hcl     REACTION: nausea, ED   Current Outpatient Prescriptions on File Prior to Visit  Medication Sig Dispense Refill  . aspirin 81 MG tablet Take 81 mg by mouth daily.        . Azelastine HCl (ASTEPRO) 0.15 % SOLN as needed.        Marland Kitchen escitalopram (LEXAPRO) 20 MG tablet Take 1 tablet (20 mg total) by mouth  daily.  90 tablet  3  . Fexofenadine HCl (ALLEGRA PO) Take by mouth. As directed        . fish oil-omega-3 fatty acids 1000 MG capsule Take 1 g by mouth daily.       . fluticasone (FLONASE) 50 MCG/ACT nasal spray Place 2 sprays into the nose daily as needed.        . hydrocortisone (ANUSOL-HC) 25 MG suppository Place 25 mg rectally every evening. As needed for 7 days as needed for rectal pain or bleeding       . Melatonin 3 MG TABS Take 2 tablets by mouth at bedtime.       . Multiple Vitamin (MULTIVITAMIN) tablet Take 1 tablet by mouth daily.        . simvastatin (ZOCOR) 20 MG tablet Take 1 tablet (20 mg total) by mouth daily.  90 tablet  3     Review of Systems Review of Systems  Constitutional: Negative for fever, appetite change, fatigue and unexpected weight change.  Eyes: Negative for pain and visual disturbance.  Respiratory: Negative for cough and shortness of breath.   Cardiovascular: Negative for cp  or palpitations    Gastrointestinal: Negative for nausea, diarrhea and constipation.  Genitourinary: Negative for urgency and frequency.  Skin: Negative for pallor or rash   Neurological: Negative for weakness, light-headedness, numbness and headaches.  Hematological: Negative for adenopathy. Does not bruise/bleed easily.  Psychiatric/Behavioral: Negative for dysphoric mood. The patient is not nervous/anxious.          Objective:   Physical Exam  Constitutional: He appears well-developed and well-nourished. No distress.       Obese and well appearing   HENT:  Head: Normocephalic and atraumatic.  Eyes: Conjunctivae and EOM are normal. Pupils are equal, round, and reactive to light. No scleral icterus.  Neck: Normal range of motion. Neck supple. No JVD present. Carotid bruit is not present. No thyromegaly present.  Cardiovascular: Normal rate, regular rhythm, normal heart sounds and intact distal pulses.  Exam reveals no gallop.   Pulmonary/Chest: Effort normal and breath sounds  normal. No respiratory distress. He has no wheezes.  Musculoskeletal: He exhibits no edema.  Lymphadenopathy:    He has no cervical adenopathy.  Neurological: He is alert. He has normal reflexes. He exhibits normal muscle tone.  Skin: Skin is warm and dry. No rash noted. No erythema. No pallor.  Psychiatric: He has a normal mood and affect.          Assessment & Plan:

## 2011-10-21 NOTE — Assessment & Plan Note (Signed)
benicar hct is working well for HTN - but need to change to hyzaar for cost Will change that  Renal panel today Follow bp at home F/u 6 mo  Disc lifestyle change and need for wt loss

## 2011-10-22 LAB — RENAL FUNCTION PANEL
Albumin: 4.2 g/dL (ref 3.5–5.2)
Chloride: 101 mEq/L (ref 96–112)
GFR: 71.91 mL/min (ref >60.00)
Glucose, Bld: 74 mg/dL (ref 70–99)
Phosphorus: 3.5 mg/dL (ref 2.3–4.6)
Potassium: 3.9 mEq/L (ref 3.5–5.1)
Sodium: 139 mEq/L (ref 135–145)

## 2011-10-30 ENCOUNTER — Telehealth: Payer: Self-pay | Admitting: *Deleted

## 2011-10-30 NOTE — Telephone Encounter (Signed)
Continue to monitor it for now- work hard on exercise and wt loss and see if that helps  Update me again in about 2 weeks

## 2011-10-30 NOTE — Telephone Encounter (Signed)
Patient called to let Dr. Milinda Antis know that since she changed his BP from Lotrel to Hyzaar he has noticed that his BP has been creeping up.  A few days ago his BP was 140/77 and yesterday at the pharmacy it was 142/89.  He wants to know if she should just continue to monitor it for now or should he go back to taking Lotrel?  Please advise.

## 2011-10-30 NOTE — Telephone Encounter (Signed)
LMOVM of home phone.  Permission given per pt.

## 2011-11-14 ENCOUNTER — Telehealth: Payer: Self-pay | Admitting: *Deleted

## 2011-11-14 NOTE — Telephone Encounter (Signed)
Patient called to give Dr. Milinda Antis his BP readings.  He stated that he has noticed that his BP is better in the morning and increases in the evening. 02/19 118/68   02/21 140/77 evening 02/27 142/89 evening 03/04 150/110  03/05 151/89 03/06 142/80  03/07 128/86  03/08/ 129/79 03/09 151/91 03/10 152/95 evening 03/11 126/86 03/12 136/89 03/13 129/88 03/14 135/81 03/15 140/92

## 2011-11-16 NOTE — Telephone Encounter (Signed)
These are ok - not all at goal  When he is done with this bottle of hyzaar 100-12.5 I will inc dose for next bottle to 100-25 and see if that brings it down just a bit more  Have him call at the end of this bottle and I will increase this dose

## 2011-11-17 ENCOUNTER — Other Ambulatory Visit: Payer: Self-pay | Admitting: Family Medicine

## 2011-11-17 MED ORDER — LOSARTAN POTASSIUM-HCTZ 100-25 MG PO TABS: 1.0000 | ORAL_TABLET | Freq: Every day | ORAL | Status: DC

## 2011-11-17 NOTE — Telephone Encounter (Signed)
Pt called and wants written rx to send to mail order pharmacy, [facility] in [place] too expensive. Pt said to call cell phone [phone] and if no answer can leave detailed message that rx is ready for pick up. Pt said he has plenty of Lotrel if this is not done before Thursday.

## 2011-11-17 NOTE — Telephone Encounter (Signed)
Patient's wife notified as instructed by telephone. Will call back when finishes this bottle of med to get new rx.

## 2011-11-17 NOTE — Telephone Encounter (Signed)
Will change hyzaar dose and elect transfer to mail order pharmacy

## 2011-11-17 NOTE — Telephone Encounter (Signed)
Spoke with pts wife and she had just spoke with pt and he runs out of hyzaar 100-12.5 mg on Thursday. Irving Burton said she would let pt know Dr Milinda Antis would send in new rx for Hyzaar 100-25 mg. To [facility] in [place] understands Dr Milinda Antis is out of the office today.

## 2011-11-19 MED ORDER — LOSARTAN POTASSIUM-HCTZ 100-25 MG PO TABS: 1.0000 | ORAL_TABLET | Freq: Every day | ORAL | Status: DC

## 2011-11-19 NOTE — Telephone Encounter (Signed)
Patient advised as instructed via telephone, he request that he pick up the Rx because he no longer uses [facility], he will come by tomorrow after work to pick up Rx.  In your IN box.

## 2011-11-19 NOTE — Telephone Encounter (Signed)
Ready for pick up in IN box -thanks

## 2011-11-20 NOTE — Telephone Encounter (Signed)
Rx left at front desk for patient pickup.

## 2011-12-15 ENCOUNTER — Telehealth: Payer: Self-pay

## 2011-12-15 NOTE — Telephone Encounter (Signed)
Appeal paper work in your IN box.  Patient stated that he prefers Lotrel or Benicar, both which requires paper work to be completed.

## 2011-12-15 NOTE — Telephone Encounter (Signed)
Pt presently taking Hyzaar 100-25. 12/10/11 BP - 144/96; 12/11/11 BP - 147/97; 12/12/11 BP - 145/97; 12/13/11 BP - 142/98; and 12/14/11 BP - 145/97. Pt states when BP elevated face feels warm. Pt said started appeal process with insurance co last week Re: going back on namebrand Lotrel. Pt said either namebrand Lotrel and Benicar control his BP. Pt uses [facility] if local pharmacy needed and [facility] for mail order. Pt can be reached at [phone].

## 2011-12-15 NOTE — Telephone Encounter (Signed)
Done and in IN box- will try for the lotrel, thanks

## 2011-12-16 MED ORDER — AMLODIPINE BESY-BENAZEPRIL HCL 5-10 MG PO CAPS: 1.0000 | ORAL_CAPSULE | Freq: Every day | ORAL | Status: DC

## 2011-12-16 NOTE — Telephone Encounter (Signed)
Patient advised via telephone, paper work has been faxed to [facility] and a Rx for Lotrel has been sent in electronically.

## 2011-12-17 ENCOUNTER — Other Ambulatory Visit: Payer: Self-pay | Admitting: *Deleted

## 2011-12-17 MED ORDER — AMLODIPINE BESY-BENAZEPRIL HCL 5-10 MG PO CAPS: 1.0000 | ORAL_CAPSULE | Freq: Every day | ORAL | Status: DC

## 2011-12-17 NOTE — Telephone Encounter (Signed)
Patient request a written Rx for brand name Lotrel.  He stated that the Rx that was sent to [facility] was not brand name and [facility] will not change the medication until we do an appeal.  Patient would like to pick up Rx today.  Rx printed and patient will pick up today.

## 2011-12-23 NOTE — Telephone Encounter (Signed)
Approval letter received and faxed to [facility] [phone]. Sent to Dr Milinda Antis for signature and then sent for scanning.

## 2011-12-23 NOTE — Telephone Encounter (Signed)
I signed it - ready for scanning - good news

## 2012-05-19 ENCOUNTER — Encounter: Payer: Self-pay | Admitting: Gastroenterology

## 2012-06-22 ENCOUNTER — Telehealth: Payer: Self-pay | Admitting: Gastroenterology

## 2012-06-22 NOTE — Telephone Encounter (Signed)
Pt states he is having problems with his fissure, he has noticed some bleeding. Pt wanted to know if he could have a couple of stiches during his colonoscopy. Let pt know we did not do that, we treat them medically. Pt requesting to be seen prior to colon. Pt scheduled to see Dr. Arlyce Dice 06/29/12 @2 :30pm. Pt aware of appt date and time.

## 2012-06-29 ENCOUNTER — Encounter: Payer: Self-pay | Admitting: Gastroenterology

## 2012-06-29 ENCOUNTER — Ambulatory Visit (INDEPENDENT_AMBULATORY_CARE_PROVIDER_SITE_OTHER): Payer: Managed Care, Other (non HMO) | Admitting: Gastroenterology

## 2012-06-29 VITALS — BP 140/84 | HR 80 | Ht 71.25 in | Wt 292.2 lb

## 2012-06-29 DIAGNOSIS — Z800 Family history of malignant neoplasm of digestive organs: Secondary | ICD-10-CM

## 2012-06-29 DIAGNOSIS — Z860100 Personal history of colon polyps, unspecified: Secondary | ICD-10-CM

## 2012-06-29 DIAGNOSIS — K602 Anal fissure, unspecified: Secondary | ICD-10-CM | POA: Insufficient documentation

## 2012-06-29 MED ORDER — HYDROCORTISONE ACETATE 25 MG RE SUPP: 25.0000 mg | Freq: Every evening | RECTAL | Status: AC

## 2012-06-29 NOTE — Patient Instructions (Addendum)
Keep appointments as scheduled colon and Previsit Your new medication is being sent to your pharmacy

## 2012-06-29 NOTE — Progress Notes (Signed)
History of Present Illness: 51 year old white male with family history of colon cancer referred at the request of Dr. Milinda Antis for colonoscopy and evaluation of rectal pain. Mother was diagnosed with colon cancer at 65. The patient has a history of colon polyps in 2005.  2008 colonoscopy was negative. He is complaining of moderately severe sharp rectal pain that often occurs when he has diarrhea. He may see blood on the toilet tissue. This is an intermittent problem. He currently is complaining of similar symptoms over the past 3 days. There has been no change in bowel habits.    Past Medical History  Diagnosis Date  . Obstructive sleep apnea (adult) (pediatric)   . Unspecified dermatitis due to sun   . Obsessive-compulsive disorders   . Anxiety states   . Unspecified sleep apnea   . Dermatophytosis of nail   . Unspecified essential hypertension   . Other and unspecified hyperlipidemia   . Seasonal allergic rhinitis    Past Surgical History  Procedure Date  . Cystectomy     cyst on hand  . Colonoscopy 11/02, 11/05    polyps   family history includes Colon cancer in his mother; Diabetes type II in his father; and Hypertension in his father. Current Outpatient Prescriptions  Medication Sig Dispense Refill  . amLODipine-benazepril (LOTREL) 5-10 MG per capsule Take 1 capsule by mouth daily.  90 capsule  3  . aspirin 81 MG tablet Take 81 mg by mouth daily.        . Azelastine HCl (ASTEPRO) 0.15 % SOLN as needed.        Marland Kitchen escitalopram (LEXAPRO) 20 MG tablet Take 1 tablet (20 mg total) by mouth daily.  90 tablet  3  . Fexofenadine HCl (ALLEGRA PO) Take by mouth. As directed        . fish oil-omega-3 fatty acids 1000 MG capsule Take 1 g by mouth daily.       . fluticasone (FLONASE) 50 MCG/ACT nasal spray Place 2 sprays into the nose daily as needed.        . hydrocortisone (ANUSOL-HC) 25 MG suppository Place 25 mg rectally every evening. As needed for 7 days as needed for rectal pain or  bleeding       . losartan-hydrochlorothiazide (HYZAAR) 100-25 MG per tablet Take 1 tablet by mouth daily.  90 tablet  3  . Melatonin 3 MG TABS Take 2 tablets by mouth at bedtime.       . Multiple Vitamin (MULTIVITAMIN) tablet Take 1 tablet by mouth daily.        . simvastatin (ZOCOR) 20 MG tablet Take 1 tablet (20 mg total) by mouth daily.  90 tablet  3  . escitalopram (LEXAPRO) 20 MG tablet Take 1 tablet (20 mg total) by mouth daily.  90 tablet  3   Allergies as of 06/29/2012 - Review Complete 06/29/2012  Allergen Reaction Noted  . Amlodipine besy-benazepril hcl    . Sertraline hcl  02/01/2007    reports that he has never smoked. He has never used smokeless tobacco. He reports that he drinks alcohol. He reports that he does not use illicit drugs.     Review of Systems: Pertinent positive and negative review of systems were noted in the above HPI section. All other review of systems were otherwise negative.  Vital signs were reviewed in today's medical record Physical Exam: General: Well developed , well nourished, no acute distress Head: Normocephalic and atraumatic Eyes:  sclerae anicteric, EOMI Ears: Normal  auditory acuity Mouth: No deformity or lesions Neck: Supple, no masses or thyromegaly Lungs: Clear throughout to auscultation Heart: Regular rate and rhythm; no murmurs, rubs or bruits Abdomen: Soft, non tender and non distended. No masses, hepatosplenomegaly or hernias noted. Normal Bowel sounds  Musculoskeletal: Symmetrical with no gross deformities  Skin: No lesions on visible extremities Pulses:  Normal pulses noted Extremities: No clubbing, cyanosis, edema or deformities noted Neurological: Alert oriented x 4, grossly nonfocal Cervical Nodes:  No significant cervical adenopathy Inguinal Nodes: No significant inguinal adenopathy Psychological:  Alert and cooperative. Normal mood and affect  Anoscopy reveals a anal fissure at the 6:00 position

## 2012-06-29 NOTE — Assessment & Plan Note (Signed)
Plan Anusol HC suppositories. I will also digitally dilate the rectum when he returns for colonoscopy

## 2012-06-29 NOTE — Assessment & Plan Note (Signed)
Plan followup colonoscopy 

## 2012-07-12 ENCOUNTER — Ambulatory Visit (AMBULATORY_SURGERY_CENTER): Payer: Managed Care, Other (non HMO) | Admitting: *Deleted

## 2012-07-12 VITALS — Ht 72.0 in | Wt 292.2 lb

## 2012-07-12 DIAGNOSIS — Z1211 Encounter for screening for malignant neoplasm of colon: Secondary | ICD-10-CM

## 2012-07-12 DIAGNOSIS — Z800 Family history of malignant neoplasm of digestive organs: Secondary | ICD-10-CM

## 2012-07-12 MED ORDER — SUPREP BOWEL PREP KIT 17.5-3.13-1.6 GM/177ML PO SOLN: ORAL | Status: DC

## 2012-07-13 ENCOUNTER — Encounter: Payer: Self-pay | Admitting: Gastroenterology

## 2012-07-23 ENCOUNTER — Ambulatory Visit (AMBULATORY_SURGERY_CENTER): Payer: Managed Care, Other (non HMO) | Admitting: Gastroenterology

## 2012-07-23 ENCOUNTER — Encounter: Payer: Self-pay | Admitting: Gastroenterology

## 2012-07-23 VITALS — BP 126/67 | HR 73 | Temp 97.3°F | Resp 21 | Ht 72.0 in | Wt 292.0 lb

## 2012-07-23 DIAGNOSIS — D126 Benign neoplasm of colon, unspecified: Secondary | ICD-10-CM

## 2012-07-23 DIAGNOSIS — I82B19 Acute embolism and thrombosis of unspecified subclavian vein: Secondary | ICD-10-CM

## 2012-07-23 DIAGNOSIS — Z1211 Encounter for screening for malignant neoplasm of colon: Secondary | ICD-10-CM

## 2012-07-23 MED ORDER — SODIUM CHLORIDE 0.9 % IV SOLN: 500.0000 mL | INTRAVENOUS | Status: DC

## 2012-07-23 NOTE — Patient Instructions (Addendum)
YOU HAD AN ENDOSCOPIC PROCEDURE TODAY AT THE [facility]: Refer to the procedure report that was given to you for any specific questions about what was found during the examination.  If the procedure report does not answer your questions, please call your gastroenterologist to clarify.  If you requested that your care partner not be given the details of your procedure findings, then the procedure report has been included in a sealed envelope for you to review at your convenience later.  YOU SHOULD EXPECT: Some feelings of bloating in the abdomen. Passage of more gas than usual.  Walking can help get rid of the air that was put into your GI tract during the procedure and reduce the bloating. If you had a lower endoscopy (such as a colonoscopy or flexible sigmoidoscopy) you may notice spotting of blood in your stool or on the toilet paper. If you underwent a bowel prep for your procedure, then you may not have a normal bowel movement for a few days.  DIET: Your first meal following the procedure should be a light meal and then it is ok to progress to your normal diet.  A half-sandwich or bowl of soup is an example of a good first meal.  Heavy or fried foods are harder to digest and may make you feel nauseous or bloated.  Likewise meals heavy in dairy and vegetables can cause extra gas to form and this can also increase the bloating.  Drink plenty of fluids but you should avoid alcoholic beverages for 24 hours.  ACTIVITY: Your care partner should take you home directly after the procedure.  You should plan to take it easy, moving slowly for the rest of the day.  You can resume normal activity the day after the procedure however you should NOT DRIVE or use heavy machinery for 24 hours (because of the sedation medicines used during the test).    SYMPTOMS TO REPORT IMMEDIATELY: A gastroenterologist can be reached at any hour.  During normal business hours, 8:30 AM to 5:00 PM Monday through Friday,  call [phone].  After hours and on weekends, please call the GI answering service at [phone] who will take a message and have the physician on call contact you.   Following lower endoscopy (colonoscopy or flexible sigmoidoscopy):  Excessive amounts of blood in the stool  Significant tenderness or worsening of abdominal pains  Swelling of the abdomen that is new, acute  Fever of 100F or higher  FOLLOW UP: If any biopsies were taken you will be contacted by phone or by letter within the next 1-3 weeks.  Call your gastroenterologist if you have not heard about the biopsies in 3 weeks.  Our staff will call the home number listed on your records the next business day following your procedure to check on you and address any questions or concerns that you may have at that time regarding the information given to you following your procedure. This is a courtesy call and so if there is no answer at the home number and we have not heard from you through the emergency physician on call, we will assume that you have returned to your regular daily activities without incident.  SIGNATURES/CONFIDENTIALITY: You and/or your care partner have signed paperwork which will be entered into your electronic medical record.  These signatures attest to the fact that that the information above on your After Visit Summary has been reviewed and is understood.  Full responsibility of the confidentiality of this   discharge information lies with you and/or your care-partner.  Handout on polyps  

## 2012-07-23 NOTE — Progress Notes (Signed)
Patient did not experience any of the following events: a burn prior to discharge; a fall within the facility; wrong site/side/patient/procedure/implant event; or a hospital transfer or hospital admission upon discharge from the facility. (G8907) Patient did not have preoperative order for IV antibiotic SSI prophylaxis. (G8918)  

## 2012-07-23 NOTE — Op Note (Signed)
[facility] [address] [place], [address]   COLONOSCOPY PROCEDURE REPORT  PATIENT: Jeffrey Hartman, Jeffrey Hartman  MR#: [id number] BIRTHDATE: 06-05-1961 , 51  yrs. old GENDER: Male ENDOSCOPIST: Louis Meckel, MD REFERRED BY: PROCEDURE DATE:  07/23/2012 PROCEDURE:   Colonoscopy with snare polypectomy ASA CLASS:   Class II INDICATIONS: MEDICATIONS: MAC sedation, administered by CRNA and propofol (Diprivan) 200mg  IV  DESCRIPTION OF PROCEDURE:   After the risks benefits and alternatives of the procedure were thoroughly explained, informed consent was obtained.  A digital rectal exam revealed no abnormalities of the rectum.   The LB [id number]  endoscope was introduced through the anus and advanced to the cecum, which was identified by both the appendix and ileocecal valve. No adverse events experienced.   The quality of the prep was Suprep excellent The instrument was then slowly withdrawn as the colon was fully examined.      COLON FINDINGS: A sessile polyp measuring 3 mm in size was found in the ascending colon.  A polypectomy was performed with a cold snare.  The resection was complete and the polyp tissue was completely retrieved.   A sessile polyp measuring 3 mm in size was found at the splenic flexure.  A polypectomy was performed with a cold snare.  The resection was complete and the polyp tissue was completely retrieved.   The colon mucosa was otherwise normal. Retroflexed views revealed no abnormalities. The time to cecum=2 minutes 53 seconds.  Withdrawal time=9 minutes 04 seconds.  The scope was withdrawn and the procedure completed. COMPLICATIONS: There were no complications.  ENDOSCOPIC IMPRESSION: 1.   Sessile polyp measuring 3 mm in size was found in the ascending colon; polypectomy was performed with a cold snare 2.   Sessile polyp measuring 3 mm in size was found at the splenic flexure; polypectomy was performed with a cold snare 3.   The  colon mucosa was otherwise normal  RECOMMENDATIONS: Given your significant family history of colon cancer, you should have a repeat colonoscopy in 5 years   eSigned:  Louis Meckel, MD 07/23/2012 8:38 AM   cc: Judy Pimple, MD and Bishop Limbo MD

## 2012-07-26 ENCOUNTER — Encounter: Payer: Managed Care, Other (non HMO) | Admitting: Gastroenterology

## 2012-07-26 ENCOUNTER — Telehealth: Payer: Self-pay | Admitting: *Deleted

## 2012-07-26 NOTE — Telephone Encounter (Signed)
No answer, message left for the patient. 

## 2012-07-28 ENCOUNTER — Encounter: Payer: Self-pay | Admitting: Family Medicine

## 2012-07-28 DIAGNOSIS — D126 Benign neoplasm of colon, unspecified: Secondary | ICD-10-CM | POA: Insufficient documentation

## 2012-08-02 ENCOUNTER — Encounter: Payer: Self-pay | Admitting: Gastroenterology

## 2012-08-13 ENCOUNTER — Ambulatory Visit: Payer: Managed Care, Other (non HMO) | Admitting: Pulmonary Disease

## 2012-08-20 ENCOUNTER — Ambulatory Visit: Payer: Managed Care, Other (non HMO) | Admitting: Pulmonary Disease

## 2012-09-10 ENCOUNTER — Ambulatory Visit (INDEPENDENT_AMBULATORY_CARE_PROVIDER_SITE_OTHER): Payer: Managed Care, Other (non HMO) | Admitting: Pulmonary Disease

## 2012-09-10 ENCOUNTER — Encounter: Payer: Self-pay | Admitting: Pulmonary Disease

## 2012-09-10 VITALS — BP 128/86 | HR 82 | Temp 98.4°F | Ht 72.0 in | Wt 288.8 lb

## 2012-09-10 DIAGNOSIS — G4733 Obstructive sleep apnea (adult) (pediatric): Secondary | ICD-10-CM

## 2012-09-10 NOTE — Progress Notes (Signed)
  Subjective:    Patient ID: Jeffrey Hartman, male    DOB: 05-May-1961, 52 y.o.   MRN: [id number]  HPI Patient comes in today for followup of his known obstructive sleep apnea.  He is wearing CPAP compliantly, and is having no issues with his mask fit or pressure.  He is sleeping well, and this very happy with his daytime alertness.  His wife has not heard pretty through snoring or apneas.  Of note, the patient's weight is down 9 pounds since last visit.   Review of Systems  Constitutional: Negative for fever and unexpected weight change.  HENT: Negative for ear pain, nosebleeds, congestion, sore throat, rhinorrhea, sneezing, trouble swallowing, dental problem, postnasal drip and sinus pressure.   Eyes: Negative for redness and itching.  Respiratory: Negative for cough, chest tightness, shortness of breath and wheezing.   Cardiovascular: Negative for palpitations and leg swelling.  Gastrointestinal: Negative for nausea and vomiting.  Genitourinary: Negative for dysuria.  Musculoskeletal: Negative for joint swelling.  Skin: Negative for rash.  Neurological: Negative for headaches.  Hematological: Does not bruise/bleed easily.  Psychiatric/Behavioral: Negative for dysphoric mood. The patient is not nervous/anxious.        Objective:   Physical Exam Overweight male in no acute distress Nose without purulence or discharge noted No skin breakdown or pressure necrosis from the CPAP mask Neck without lymphadenopathy or thyromegaly Lower extremities without edema, cyanosis Alert, does not appear to be sleepy, moves all 4 extremities.       Assessment & Plan:

## 2012-09-10 NOTE — Patient Instructions (Addendum)
Continue cpap, and keep up with supply changes. Continue working on weight loss followup with me in one year.

## 2012-09-10 NOTE — Assessment & Plan Note (Signed)
The patient is doing very well on CPAP, and is having no issues with his equipment.  He has excellent symptom control, and has lost weight since the last visit.  I have asked him to continue with his weight loss, and also to keep up with his mask changes and supplies.  He will followup with me in one year.

## 2012-10-11 ENCOUNTER — Other Ambulatory Visit: Payer: Self-pay | Admitting: Family Medicine

## 2012-10-13 ENCOUNTER — Other Ambulatory Visit: Payer: Self-pay | Admitting: Family Medicine

## 2012-10-13 NOTE — Telephone Encounter (Signed)
Ok to refill 

## 2012-10-27 ENCOUNTER — Telehealth: Payer: Self-pay | Admitting: Family Medicine

## 2012-10-27 DIAGNOSIS — E785 Hyperlipidemia, unspecified: Secondary | ICD-10-CM

## 2012-10-27 DIAGNOSIS — I10 Essential (primary) hypertension: Secondary | ICD-10-CM

## 2012-10-27 NOTE — Telephone Encounter (Signed)
Message copied by Judy Pimple on Wed Oct 27, 2012  5:42 PM ------      Message from: Alvina Chou      Created: Wed Oct 20, 2012 11:42 AM      Regarding: Lab orders for Thursday, 2.27.14       Patient is scheduled for CPX labs, please order future labs, Thanks , Terri       ------

## 2012-10-29 ENCOUNTER — Other Ambulatory Visit (INDEPENDENT_AMBULATORY_CARE_PROVIDER_SITE_OTHER): Payer: Managed Care, Other (non HMO)

## 2012-10-29 DIAGNOSIS — Z0000 Encounter for general adult medical examination without abnormal findings: Secondary | ICD-10-CM

## 2012-10-29 LAB — COMPREHENSIVE METABOLIC PANEL
ALT: 42 U/L (ref 0–53)
AST: 24 U/L (ref 0–37)
Albumin: 4 g/dL (ref 3.5–5.2)
CO2: 23 mEq/L (ref 19–32)
Calcium: 9.2 mg/dL (ref 8.4–10.5)
Chloride: 108 mEq/L (ref 96–112)
GFR: 81.32 mL/min (ref >60.00)
Potassium: 4.1 mEq/L (ref 3.5–5.1)

## 2012-10-29 LAB — CBC WITH DIFFERENTIAL/PLATELET
Basophils Absolute: 0 10*3/uL (ref 0.0–0.1)
Eosinophils Absolute: 0.1 10*3/uL (ref 0.0–0.7)
HCT: 40.8 % (ref 39.0–52.0)
Hemoglobin: 13.8 g/dL (ref 13.0–17.0)
Lymphs Abs: 1.4 10*3/uL (ref 0.7–4.0)
MCHC: 33.7 g/dL (ref 30.0–36.0)
Monocytes Absolute: 0.5 10*3/uL (ref 0.1–1.0)
Neutro Abs: 3.7 10*3/uL (ref 1.4–7.7)
Platelets: 254 10*3/uL (ref 150.0–400.0)
RDW: 14.3 % (ref 11.5–14.6)

## 2012-10-29 LAB — LIPID PANEL: Total CHOL/HDL Ratio: 5

## 2012-10-29 LAB — TSH: TSH: 0.96 u[IU]/mL (ref 0.35–5.50)

## 2012-11-05 ENCOUNTER — Encounter: Payer: Managed Care, Other (non HMO) | Admitting: Family Medicine

## 2012-11-08 ENCOUNTER — Encounter: Payer: Self-pay | Admitting: Family Medicine

## 2012-11-08 ENCOUNTER — Ambulatory Visit (INDEPENDENT_AMBULATORY_CARE_PROVIDER_SITE_OTHER): Payer: Managed Care, Other (non HMO) | Admitting: Family Medicine

## 2012-11-08 VITALS — BP 142/78 | HR 72 | Temp 98.3°F | Ht 70.0 in | Wt 283.5 lb

## 2012-11-08 DIAGNOSIS — E669 Obesity, unspecified: Secondary | ICD-10-CM

## 2012-11-08 DIAGNOSIS — I10 Essential (primary) hypertension: Secondary | ICD-10-CM

## 2012-11-08 DIAGNOSIS — Z0000 Encounter for general adult medical examination without abnormal findings: Secondary | ICD-10-CM

## 2012-11-08 MED ORDER — SIMVASTATIN 20 MG PO TABS: 20.0000 mg | ORAL_TABLET | Freq: Every day | ORAL | Status: DC

## 2012-11-08 MED ORDER — ESCITALOPRAM OXALATE 20 MG PO TABS: 20.0000 mg | ORAL_TABLET | Freq: Every day | ORAL | Status: DC

## 2012-11-08 MED ORDER — AMLODIPINE BESY-BENAZEPRIL HCL 5-10 MG PO CAPS: 1.0000 | ORAL_CAPSULE | Freq: Every day | ORAL | Status: DC

## 2012-11-08 NOTE — Assessment & Plan Note (Signed)
colonosc 11/13- one adenomatous and one hyperplastic Re check 5 y Also fam hx No bowel changes

## 2012-11-08 NOTE — Assessment & Plan Note (Signed)
bp in fair control at this time  No changes needed  Disc lifstyle change with low sodium diet and exercise   

## 2012-11-08 NOTE — Assessment & Plan Note (Signed)
Disc goals for lipids and reasons to control them Rev labs with pt Rev low sat fat diet in detail Disc need to raise HDL- pt will exercise more  Continue zocor

## 2012-11-08 NOTE — Assessment & Plan Note (Signed)
Commended wt loss so far  Discussed how this problem influences overall health and the risks it imposes  Reviewed plan for weight loss with lower calorie diet (via better food choices and also portion control or program like weight watchers) and exercise building up to or more than 30 minutes 5 days per week including some aerobic activity    

## 2012-11-08 NOTE — Assessment & Plan Note (Signed)
Reviewed health habits including diet and exercise and skin cancer prevention Also reviewed health mt list, fam hx and immunizations  Wellness labs reviewed Nl DRE today

## 2012-11-08 NOTE — Progress Notes (Signed)
Subjective:    Patient ID: Jeffrey Hartman, male    DOB: 1961/07/30, 52 y.o.   MRN: [id number]  HPI Here for health maintenance exam and to review chronic medical problems    Is doing ok overall   Wt is down 5 lb with bmi of 40 Is working on it  His wife had bariatric surgery- he is trying to eat better as well -- tries to walk 2-3 miles   bp is stable today  No cp or palpitations or headaches or edema  No side effects to medicines  BP Readings from Last 3 Encounters:  11/08/12 142/78  09/10/12 128/86  07/23/12 126/67     Hyperlipidemia Lab Results  Component Value Date   CHOL 127 10/29/2012   CHOL 154 09/17/2011   CHOL 149 03/18/2011   Lab Results  Component Value Date   HDL 28.20* 10/29/2012   HDL 33.10* 09/17/2011   HDL 33.30* 03/18/2011   Lab Results  Component Value Date   LDLCALC 75 10/29/2012   LDLCALC 91 09/17/2011   LDLCALC 80 03/18/2011   Lab Results  Component Value Date   TRIG 117.0 10/29/2012   TRIG 151.0* 09/17/2011   TRIG 181.0* 03/18/2011   Lab Results  Component Value Date   CHOLHDL 5 10/29/2012   CHOLHDL 5 09/17/2011   CHOLHDL 4 03/18/2011   Lab Results  Component Value Date   LDLDIRECT 109.0 09/21/2009   on zocor and diet HDL is difficult to get up  Really enjoys his walking   Td 04-  Mother colon cancer Personal hx polyps Had colonosc 11/13  Mood - doing fine with that - keeping positive   Patient Active Problem List  Diagnosis  . ONYCHOMYCOSIS  . HYPERLIPIDEMIA  . OBESITY  . OBSESSIVE-COMPULSIVE DISORDER  . OBSTRUCTIVE SLEEP APNEA  . HYPERTENSION  . ALLERGIC RHINITIS  . ACTINIC SKIN DAMAGE  . Routine general medical examination at a health care facility  . Family history of malignant neoplasm of gastrointestinal tract  . Anal fissure  . Personal history of colonic polyps  . Adenomatous colon polyp   Past Medical History  Diagnosis Date  . Obstructive sleep apnea (adult) (pediatric)   . Unspecified dermatitis due to sun   .  Obsessive-compulsive disorders   . Anxiety states   . Unspecified sleep apnea   . Dermatophytosis of nail   . Unspecified essential hypertension   . Other and unspecified hyperlipidemia   . Seasonal allergic rhinitis    Past Surgical History  Procedure Laterality Date  . Cystectomy      cyst on hand  . Colonoscopy  11/02, 11/05    polyps   History  Substance Use Topics  . Smoking status: Never Smoker   . Smokeless tobacco: Never Used  . Alcohol Use: Yes     Comment: occasionally,1 beer once month   Family History  Problem Relation Age of Onset  . Hypertension Father   . Diabetes type II Father   . Cancer - Other Father 38    Adrenal Cancer  . Colon cancer Mother 87  . Prostate cancer Neg Hx    Allergies  Allergen Reactions  . Shellfish Allergy Shortness Of Breath and Other (See Comments)    Reaction=tunnel vision  . Amlodipine Besy-Benazepril Hcl     REACTION: Generic med did not adequatly control htnh  . Sertraline Hcl Nausea Only    REACTION: nausea, ED   Current Outpatient Prescriptions on File Prior to Visit  Medication  Sig Dispense Refill  . amLODipine-benazepril (LOTREL) 5-10 MG per capsule Take 1 capsule by mouth daily.  90 capsule  3  . aspirin 81 MG tablet Take 81 mg by mouth daily.        . Azelastine HCl (ASTEPRO) 0.15 % SOLN as needed.        Marland Kitchen escitalopram (LEXAPRO) 20 MG tablet Take 1 tablet (20 mg total) by mouth daily.  90 tablet  3  . escitalopram (LEXAPRO) 20 MG tablet TAKE 1 TABLET DAILY  90 tablet  0  . Fexofenadine HCl (ALLEGRA PO) Take by mouth. As directed        . fish oil-omega-3 fatty acids 1000 MG capsule Take 1 g by mouth daily.       . fluticasone (FLONASE) 50 MCG/ACT nasal spray Place 2 sprays into the nose daily as needed.        . hydrocortisone (ANUSOL-HC) 25 MG suppository Place 1 suppository (25 mg total) rectally every evening. As needed for 7 days as needed for rectal pain or bleeding  14 suppository  2  . Melatonin 3 MG TABS  Take 2 tablets by mouth at bedtime.       . Multiple Vitamin (MULTIVITAMIN) tablet Take 1 tablet by mouth daily.        . simvastatin (ZOCOR) 20 MG tablet TAKE 1 TABLET DAILY  90 tablet  0  . escitalopram (LEXAPRO) 20 MG tablet Take 1 tablet (20 mg total) by mouth daily.  90 tablet  3   No current facility-administered medications on file prior to visit.    Review of Systems Review of Systems  Constitutional: Negative for fever, appetite change, fatigue and unexpected weight change.  Eyes: Negative for pain and visual disturbance.  Respiratory: Negative for cough and shortness of breath.   Cardiovascular: Negative for cp or palpitations    Gastrointestinal: Negative for nausea, diarrhea and constipation.  Genitourinary: Negative for urgency and frequency.  Skin: Negative for pallor or rash   Neurological: Negative for weakness, light-headedness, numbness and headaches.  Hematological: Negative for adenopathy. Does not bruise/bleed easily.  Psychiatric/Behavioral: Negative for dysphoric mood. The patient is not nervous/anxious.         Objective:   Physical Exam  Constitutional: He appears well-developed and well-nourished. No distress.  obese and well appearing   HENT:  Head: Normocephalic and atraumatic.  Right Ear: External ear normal.  Left Ear: External ear normal.  Nose: Nose normal.  Mouth/Throat: Oropharynx is clear and moist.  Eyes: Conjunctivae and EOM are normal. Pupils are equal, round, and reactive to light. Right eye exhibits no discharge. Left eye exhibits no discharge. No scleral icterus.  Neck: Normal range of motion. Neck supple. No JVD present. Carotid bruit is not present. No thyromegaly present.  Cardiovascular: Normal rate, regular rhythm, normal heart sounds and intact distal pulses.  Exam reveals no gallop.   Pulmonary/Chest: Breath sounds normal. No respiratory distress. He has no wheezes. He has no rales.  Abdominal: Soft. Bowel sounds are normal. He  exhibits no distension, no abdominal bruit and no mass. There is no tenderness.  Genitourinary: Rectum normal and prostate normal. Rectal exam shows no tenderness and anal tone normal. Prostate is not enlarged and not tender.  Musculoskeletal: He exhibits no edema and no tenderness.  Lymphadenopathy:    He has no cervical adenopathy.  Neurological: He is alert. He has normal reflexes. No cranial nerve deficit. He exhibits normal muscle tone. Coordination normal.  Skin: Skin is warm  and dry. No rash noted. No erythema. No pallor.  Psychiatric: He has a normal mood and affect.          Assessment & Plan:

## 2012-11-08 NOTE — Patient Instructions (Addendum)
To raise HDL - keep working on exercise and take fish oil Avoid red meat/ fried foods/ egg yolks/ fatty breakfast meats/ butter, cheese and high fat dairy/ and shellfish   Keep working on weight loss  Labs are overall stable  Tdap today

## 2012-11-09 ENCOUNTER — Telehealth: Payer: Self-pay | Admitting: Family Medicine

## 2012-11-09 MED ORDER — ESCITALOPRAM OXALATE 20 MG PO TABS: 20.0000 mg | ORAL_TABLET | Freq: Every day | ORAL | Status: DC

## 2012-11-09 NOTE — Telephone Encounter (Signed)
refil of lexapro sent to mail order pharmacy

## 2012-12-29 ENCOUNTER — Ambulatory Visit (INDEPENDENT_AMBULATORY_CARE_PROVIDER_SITE_OTHER): Payer: Managed Care, Other (non HMO) | Admitting: *Deleted

## 2012-12-29 DIAGNOSIS — Z23 Encounter for immunization: Secondary | ICD-10-CM

## 2013-03-02 ENCOUNTER — Encounter: Payer: Self-pay | Admitting: Family Medicine

## 2013-03-02 ENCOUNTER — Ambulatory Visit (INDEPENDENT_AMBULATORY_CARE_PROVIDER_SITE_OTHER): Payer: Managed Care, Other (non HMO) | Admitting: Family Medicine

## 2013-03-02 VITALS — BP 120/78 | HR 62 | Temp 98.4°F | Ht 70.0 in | Wt 285.0 lb

## 2013-03-02 DIAGNOSIS — J019 Acute sinusitis, unspecified: Secondary | ICD-10-CM

## 2013-03-02 DIAGNOSIS — J309 Allergic rhinitis, unspecified: Secondary | ICD-10-CM

## 2013-03-02 MED ORDER — FLUTICASONE PROPIONATE 50 MCG/ACT NA SUSP: 2.0000 | Freq: Every day | NASAL | Status: DC | PRN

## 2013-03-02 MED ORDER — AZELASTINE HCL 0.15 % NA SOLN: 1.0000 | Freq: Two times a day (BID) | NASAL | Status: DC

## 2013-03-02 MED ORDER — AMOXICILLIN 500 MG PO CAPS: 1000.0000 mg | ORAL_CAPSULE | Freq: Two times a day (BID) | ORAL | Status: DC

## 2013-03-02 NOTE — Assessment & Plan Note (Signed)
Restart nasal steroid and antihistamine spray. If not improving fill prescription for antibiotic.

## 2013-03-02 NOTE — Patient Instructions (Addendum)
Can look into dimysta to see if cheaper than astepro and fluticasone seperately.  Start back on nasal antihistamine and nasal steroid. Nasal saline spray.  If not improving fill prescription for antibiotic.

## 2013-03-02 NOTE — Progress Notes (Signed)
  Subjective:    Patient ID: Jeffrey Hartman, male    DOB: Oct 15, 1960, 52 y.o.   MRN: [id number]  Sinusitis This is a new problem. The current episode started in the past 7 days (sick contact last week). The problem has been gradually worsening since onset. Associated symptoms include congestion, coughing, ear pain, headaches, sinus pressure and sneezing. Pertinent negatives include no hoarse voice, neck pain, shortness of breath, sore throat or swollen glands. (Right sided face pain  left ear mild tender) Past treatments include oral decongestants (Astepro, nyquil, dayquil, tylenol cold, has been taking decongestant). The treatment provided mild relief.      Review of Systems  HENT: Positive for ear pain, congestion, sneezing and sinus pressure. Negative for sore throat, hoarse voice and neck pain.   Respiratory: Positive for cough. Negative for shortness of breath.   Neurological: Positive for headaches.       Objective:   Physical Exam  Constitutional: Vital signs are normal. He appears well-developed and well-nourished.  Non-toxic appearance. He does not appear ill. No distress.  HENT:  Head: Normocephalic and atraumatic.  Right Ear: Hearing, tympanic membrane, external ear and ear canal normal. No tenderness. No foreign bodies. Tympanic membrane is not retracted and not bulging.  Left Ear: Hearing, tympanic membrane, external ear and ear canal normal. No tenderness. No foreign bodies. Tympanic membrane is not retracted and not bulging.  Nose: No mucosal edema or rhinorrhea. Right sinus exhibits maxillary sinus tenderness. Right sinus exhibits no frontal sinus tenderness. Left sinus exhibits no maxillary sinus tenderness and no frontal sinus tenderness.  Mouth/Throat: Uvula is midline, oropharynx is clear and moist and mucous membranes are normal. Normal dentition. No dental caries. No oropharyngeal exudate or tonsillar abscesses.  Eyes: Conjunctivae, EOM and lids are normal. Pupils are  equal, round, and reactive to light. No foreign bodies found.  Neck: Trachea normal, normal range of motion and phonation normal. Neck supple. Carotid bruit is not present. No mass and no thyromegaly present.  Cardiovascular: Normal rate, regular rhythm, S1 normal, S2 normal, normal heart sounds, intact distal pulses and normal pulses.  Exam reveals no gallop.   No murmur heard. Pulmonary/Chest: Effort normal and breath sounds normal. No respiratory distress. He has no wheezes. He has no rhonchi. He has no rales.  Abdominal: Soft. Normal appearance and bowel sounds are normal. There is no hepatosplenomegaly. There is no tenderness. There is no rebound, no guarding and no CVA tenderness. No hernia.  Neurological: He is alert. He has normal reflexes.  Skin: Skin is warm, dry and intact. No rash noted.  Psychiatric: He has a normal mood and affect. His speech is normal and behavior is normal. Judgment normal.          Assessment & Plan:

## 2013-04-19 ENCOUNTER — Telehealth: Payer: Self-pay

## 2013-04-19 NOTE — Telephone Encounter (Signed)
Forms faxed

## 2013-04-19 NOTE — Telephone Encounter (Signed)
Done and in IN box 

## 2013-04-19 NOTE — Telephone Encounter (Signed)
Pt request brand penalty exception form for Lotrel and Lexapro; forms on Dr Royden Purl shelf.

## 2013-04-25 NOTE — Telephone Encounter (Signed)
Approval letter from [facility] received for Lotrel; in Dr Timoteo Expose in box for signature and scanning.

## 2013-04-25 NOTE — Telephone Encounter (Signed)
Approval letter was put on Dr Royden Purl shelf and routed to Dr Reece Agar in error.

## 2013-07-07 ENCOUNTER — Other Ambulatory Visit: Payer: Self-pay

## 2013-07-29 ENCOUNTER — Other Ambulatory Visit: Payer: Self-pay | Admitting: *Deleted

## 2013-07-29 MED ORDER — FLUTICASONE PROPIONATE 50 MCG/ACT NA SUSP: 2.0000 | Freq: Every day | NASAL | Status: DC | PRN

## 2013-07-29 MED ORDER — AZELASTINE HCL 0.15 % NA SOLN: 1.0000 | Freq: Two times a day (BID) | NASAL | Status: DC

## 2013-08-02 ENCOUNTER — Telehealth: Payer: Self-pay | Admitting: *Deleted

## 2013-08-02 NOTE — Telephone Encounter (Signed)
Pharmacy( [facility]) faxed over a request for permission to switch azelastine 0.15% to 137 mcg?

## 2013-08-02 NOTE — Telephone Encounter (Signed)
Ok with me if ok with the patient 

## 2013-08-03 NOTE — Telephone Encounter (Signed)
Left voicemail letting pharmacy know it's okay to switch medication as long as pt is okay with it

## 2013-09-16 ENCOUNTER — Ambulatory Visit (INDEPENDENT_AMBULATORY_CARE_PROVIDER_SITE_OTHER): Payer: Managed Care, Other (non HMO) | Admitting: Pulmonary Disease

## 2013-09-16 ENCOUNTER — Encounter: Payer: Self-pay | Admitting: Pulmonary Disease

## 2013-09-16 VITALS — BP 112/80 | HR 82 | Temp 98.3°F | Ht 72.0 in | Wt 288.6 lb

## 2013-09-16 DIAGNOSIS — G4733 Obstructive sleep apnea (adult) (pediatric): Secondary | ICD-10-CM

## 2013-09-16 NOTE — Assessment & Plan Note (Signed)
The patient is doing very well on CPAP, and is completely satisfied with his sleep and daytime alertness. I've encouraged him to work aggressively on weight loss, and also keep up with his mask changes and supplies.

## 2013-09-16 NOTE — Progress Notes (Signed)
   Subjective:    Patient ID: Jeffrey Hartman, male    DOB: 12-07-1960, 53 y.o.   MRN: [id number]  HPI The patient comes in today for followup of his obstructive sleep apnea. He is wearing CPAP compliantly, and is having no issues with his mask fit or pressure. He is sleeping very well at night, and has no daytime sleepiness issues. His weight is stable since last visit.   Review of Systems  Constitutional: Negative for fever and unexpected weight change.  HENT: Negative for congestion, dental problem, ear pain, nosebleeds, postnasal drip, rhinorrhea, sinus pressure, sneezing, sore throat and trouble swallowing.   Eyes: Negative for redness and itching.  Respiratory: Negative for cough, chest tightness, shortness of breath and wheezing.   Cardiovascular: Negative for palpitations and leg swelling.  Gastrointestinal: Negative for nausea and vomiting.  Genitourinary: Negative for dysuria.  Musculoskeletal: Negative for joint swelling.  Skin: Negative for rash.  Neurological: Negative for headaches.  Hematological: Does not bruise/bleed easily.  Psychiatric/Behavioral: Negative for dysphoric mood. The patient is not nervous/anxious.        Objective:   Physical Exam Overweight male, no acute distress Nose without purulence or d/c noted. No skin breakdown or pressure necrosis from cpap mask LE without edema, no cyanosis Alert, does not appear sleepy, moves all 4.       Assessment & Plan:

## 2013-09-16 NOTE — Patient Instructions (Signed)
Continue on cpap Work on weight loss followup with in one year.

## 2013-10-21 ENCOUNTER — Other Ambulatory Visit: Payer: Self-pay | Admitting: Dermatology

## 2013-10-28 ENCOUNTER — Telehealth: Payer: Self-pay

## 2013-10-28 NOTE — Telephone Encounter (Signed)
Lovena Le with [facility] left v/m requesting cb for case # [id number] to verify fax # I called back and verified fax [phone].

## 2013-11-01 ENCOUNTER — Other Ambulatory Visit: Payer: Managed Care, Other (non HMO)

## 2013-11-02 ENCOUNTER — Telehealth: Payer: Self-pay | Admitting: Family Medicine

## 2013-11-02 ENCOUNTER — Other Ambulatory Visit (INDEPENDENT_AMBULATORY_CARE_PROVIDER_SITE_OTHER): Payer: Managed Care, Other (non HMO)

## 2013-11-02 ENCOUNTER — Other Ambulatory Visit: Payer: Managed Care, Other (non HMO)

## 2013-11-02 ENCOUNTER — Ambulatory Visit: Payer: Managed Care, Other (non HMO)

## 2013-11-02 DIAGNOSIS — Z0000 Encounter for general adult medical examination without abnormal findings: Secondary | ICD-10-CM

## 2013-11-02 DIAGNOSIS — R740 Nonspecific elevation of levels of transaminase and lactic acid dehydrogenase [LDH]: Secondary | ICD-10-CM

## 2013-11-02 DIAGNOSIS — R5383 Other fatigue: Principal | ICD-10-CM

## 2013-11-02 DIAGNOSIS — R7402 Elevation of levels of lactic acid dehydrogenase (LDH): Secondary | ICD-10-CM

## 2013-11-02 DIAGNOSIS — Z125 Encounter for screening for malignant neoplasm of prostate: Secondary | ICD-10-CM

## 2013-11-02 DIAGNOSIS — R197 Diarrhea, unspecified: Secondary | ICD-10-CM

## 2013-11-02 DIAGNOSIS — R1013 Epigastric pain: Secondary | ICD-10-CM

## 2013-11-02 DIAGNOSIS — R5381 Other malaise: Secondary | ICD-10-CM

## 2013-11-02 LAB — COMPREHENSIVE METABOLIC PANEL
ALK PHOS: 60 U/L (ref 39–117)
ALT: 38 U/L (ref 0–53)
AST: 23 U/L (ref 0–37)
Albumin: 4.2 g/dL (ref 3.5–5.2)
BILIRUBIN TOTAL: 0.8 mg/dL (ref 0.3–1.2)
BUN: 18 mg/dL (ref 6–23)
CO2: 27 meq/L (ref 19–32)
CREATININE: 1 mg/dL (ref 0.4–1.5)
Calcium: 9.4 mg/dL (ref 8.4–10.5)
Chloride: 104 mEq/L (ref 96–112)
GFR: 80.99 mL/min (ref >60.00)
GLUCOSE: 83 mg/dL (ref 70–99)
Potassium: 4.1 mEq/L (ref 3.5–5.1)
Sodium: 138 mEq/L (ref 135–145)
Total Protein: 7.2 g/dL (ref 6.0–8.3)

## 2013-11-02 LAB — CBC WITH DIFFERENTIAL/PLATELET
Basophils Absolute: 0 10*3/uL (ref 0.0–0.1)
Basophils Relative: 0.4 % (ref 0.0–3.0)
EOS PCT: 4.1 % (ref 0.0–5.0)
Eosinophils Absolute: 0.3 10*3/uL (ref 0.0–0.7)
HEMATOCRIT: 42.9 % (ref 39.0–52.0)
HEMOGLOBIN: 14.3 g/dL (ref 13.0–17.0)
LYMPHS ABS: 1.5 10*3/uL (ref 0.7–4.0)
Lymphocytes Relative: 22.9 % (ref 12.0–46.0)
MCHC: 33.2 g/dL (ref 30.0–36.0)
MCV: 88.3 fl (ref 78.0–100.0)
MONO ABS: 0.5 10*3/uL (ref 0.1–1.0)
MONOS PCT: 7.9 % (ref 3.0–12.0)
NEUTROS ABS: 4.3 10*3/uL (ref 1.4–7.7)
Neutrophils Relative %: 64.7 % (ref 43.0–77.0)
PLATELETS: 268 10*3/uL (ref 150.0–400.0)
RBC: 4.86 Mil/uL (ref 4.22–5.81)
RDW: 14 % (ref 11.5–14.6)
WBC: 6.7 10*3/uL (ref 4.5–10.5)

## 2013-11-02 LAB — TESTOSTERONE: TESTOSTERONE: 182.64 ng/dL — AB (ref 350.00–890.00)

## 2013-11-02 LAB — LIPID PANEL
CHOL/HDL RATIO: 5
Cholesterol: 159 mg/dL (ref 0–200)
HDL: 32.9 mg/dL — ABNORMAL LOW (ref >39.00)
LDL CALC: 94 mg/dL (ref 0–99)
TRIGLYCERIDES: 163 mg/dL — AB (ref 0.0–149.0)
VLDL: 32.6 mg/dL (ref 0.0–40.0)

## 2013-11-02 LAB — PSA: PSA: 0.92 ng/mL (ref 0.10–4.00)

## 2013-11-02 LAB — TSH: TSH: 1.25 u[IU]/mL (ref 0.35–5.50)

## 2013-11-02 NOTE — Telephone Encounter (Signed)
Message copied by Abner Greenspan on Wed Nov 02, 2013  6:14 AM ------      Message from: Ellamae Sia      Created: Fri Oct 28, 2013 12:37 PM      Regarding: Lab orders for Wednesday, 3.4.15       Patient is scheduled for CPX labs, please order future labs, Thanks , Terri       ------

## 2013-11-09 ENCOUNTER — Encounter: Payer: Self-pay | Admitting: Family Medicine

## 2013-11-09 ENCOUNTER — Ambulatory Visit (INDEPENDENT_AMBULATORY_CARE_PROVIDER_SITE_OTHER): Payer: Managed Care, Other (non HMO) | Admitting: Family Medicine

## 2013-11-09 ENCOUNTER — Ambulatory Visit (INDEPENDENT_AMBULATORY_CARE_PROVIDER_SITE_OTHER)
Admission: RE | Admit: 2013-11-09 | Discharge: 2013-11-09 | Disposition: A | Discharge status: Home / Self Care | Payer: Managed Care, Other (non HMO) | Source: Ambulatory Visit | Attending: Family Medicine | Admitting: Family Medicine

## 2013-11-09 VITALS — BP 118/76 | HR 72 | Temp 98.6°F | Ht 70.0 in | Wt 285.5 lb

## 2013-11-09 DIAGNOSIS — E291 Testicular hypofunction: Secondary | ICD-10-CM

## 2013-11-09 DIAGNOSIS — I10 Essential (primary) hypertension: Secondary | ICD-10-CM

## 2013-11-09 DIAGNOSIS — E669 Obesity, unspecified: Secondary | ICD-10-CM

## 2013-11-09 DIAGNOSIS — M79672 Pain in left foot: Secondary | ICD-10-CM

## 2013-11-09 DIAGNOSIS — M79609 Pain in unspecified limb: Secondary | ICD-10-CM

## 2013-11-09 DIAGNOSIS — E785 Hyperlipidemia, unspecified: Secondary | ICD-10-CM

## 2013-11-09 DIAGNOSIS — Z125 Encounter for screening for malignant neoplasm of prostate: Secondary | ICD-10-CM

## 2013-11-09 DIAGNOSIS — Z0000 Encounter for general adult medical examination without abnormal findings: Secondary | ICD-10-CM

## 2013-11-09 DIAGNOSIS — R7989 Other specified abnormal findings of blood chemistry: Secondary | ICD-10-CM | POA: Insufficient documentation

## 2013-11-09 MED ORDER — ESCITALOPRAM OXALATE 20 MG PO TABS: 20.0000 mg | ORAL_TABLET | Freq: Every day | ORAL | Status: DC

## 2013-11-09 MED ORDER — SIMVASTATIN 20 MG PO TABS: 20.0000 mg | ORAL_TABLET | Freq: Every day | ORAL | Status: DC

## 2013-11-09 MED ORDER — AMLODIPINE BESY-BENAZEPRIL HCL 5-10 MG PO CAPS: 1.0000 | ORAL_CAPSULE | Freq: Every day | ORAL | Status: DC

## 2013-11-09 NOTE — Progress Notes (Signed)
Subjective:    Patient ID: Jeffrey Hartman, male    DOB: Nov 01, 1960, 53 y.o.   MRN: [id number]  HPI Here for health maintenance exam and to review chronic medical problems    Is doing fine  Notices his age more an more    Wt is down 1 lb with bmi of 63 Is trying to take care of himself   Had several uris this winter   Flu shot 9/14 Td 4/14  bp is stable today  No cp or palpitations or headaches or edema  No side effects to medicines  BP Readings from Last 3 Encounters:  11/09/13 118/76  09/16/13 112/80  03/02/13 120/78     colonsc 11/13 - with 5 year recall  Had polyps  Also has family hx of colon ca in mother   Pt req testosterone test  Low at 182 He has low libido and also fatigue (even with cpap) Generally lethargic    Prostate health No fam hx prostate ca Lab Results  Component Value Date   PSA 0.92 11/02/2013  no fam hx of prostate cancer  No noturia or other symptoms    Hyperlipidemia  zocor and diet Lab Results  Component Value Date   CHOL 159 11/02/2013   CHOL 127 10/29/2012   CHOL 154 09/17/2011   Lab Results  Component Value Date   HDL 32.90* 11/02/2013   HDL 28.20* 10/29/2012   HDL 33.10* 09/17/2011   Lab Results  Component Value Date   LDLCALC 94 11/02/2013   LDLCALC 75 10/29/2012   LDLCALC 91 09/17/2011   Lab Results  Component Value Date   TRIG 163.0* 11/02/2013   TRIG 117.0 10/29/2012   TRIG 151.0* 09/17/2011   Lab Results  Component Value Date   CHOLHDL 5 11/02/2013   CHOLHDL 5 10/29/2012   CHOLHDL 5 09/17/2011   Lab Results  Component Value Date   LDLDIRECT 109.0 09/21/2009   cholesterol is up a bit - but HDL is a bit higher  Diet has been fair -some good and some bad  No longer has snacks and junk food in the house or sodas  When he eats out - he generally eats soup and salad  Does not eat high cholesterol foods   He had been exercising - but having foot pain issues (left) This comes and goes - gets better for a while if he does  not exercise  He got new shoes and that helped temporarily (work and athletic shoes) Standing and walking - make it worse  It starts in arch of foot and then moves to sides - then progresses to the achilles tendon area and runs up his leg  No swelling at all  He has not tried exercise bike or water exercise or other things   Used to have an exercise facility at his campus -and then it moved     Chemistry      Component Value Date/Time   NA 138 11/02/2013 0752   K 4.1 11/02/2013 0752   CL 104 11/02/2013 0752   CO2 27 11/02/2013 0752   BUN 18 11/02/2013 0752   CREATININE 1.0 11/02/2013 0752      Component Value Date/Time   CALCIUM 9.4 11/02/2013 0752   ALKPHOS 60 11/02/2013 0752   AST 23 11/02/2013 0752   ALT 38 11/02/2013 0752   BILITOT 0.8 11/02/2013 0752     Lab Results  Component Value Date   TSH 1.25 11/02/2013    Glucose  is 72   Lab Results  Component Value Date   WBC 6.7 11/02/2013   HGB 14.3 11/02/2013   HCT 42.9 11/02/2013   MCV 88.3 11/02/2013   PLT 268.0 11/02/2013     Patient Active Problem List   Diagnosis Date Noted  . Left foot pain 11/09/2013  . Low testosterone 11/09/2013  . Prostate cancer screening 11/02/2013  . Adenomatous colon polyp 07/28/2012  . Family history of malignant neoplasm of gastrointestinal tract 06/29/2012  . Anal fissure 06/29/2012  . Personal history of colonic polyps 06/29/2012  . Routine general medical examination at a health care facility 09/17/2011  . OBESITY 01/01/2010  . ALLERGIC RHINITIS 10/13/2008  . OBSTRUCTIVE SLEEP APNEA 08/11/2007  . OBSESSIVE-COMPULSIVE DISORDER 01/21/2007  . ACTINIC SKIN DAMAGE 01/21/2007  . ONYCHOMYCOSIS 01/13/2007  . HYPERLIPIDEMIA 01/13/2007  . HYPERTENSION 01/13/2007   Past Medical History  Diagnosis Date  . Obstructive sleep apnea (adult) (pediatric)   . Unspecified dermatitis due to sun   . Obsessive-compulsive disorders   . Anxiety states   . Unspecified sleep apnea   . Dermatophytosis of nail   . Unspecified  essential hypertension   . Other and unspecified hyperlipidemia   . Seasonal allergic rhinitis    Past Surgical History  Procedure Laterality Date  . Cystectomy      cyst on hand  . Colonoscopy  11/02, 11/05    polyps   History  Substance Use Topics  . Smoking status: Never Smoker   . Smokeless tobacco: Never Used  . Alcohol Use: Yes     Comment: occasionally,1 beer once month   Family History  Problem Relation Age of Onset  . Hypertension Father   . Diabetes type II Father   . Cancer - Other Father 44    Adrenal Cancer  . Colon cancer Mother 22  . Prostate cancer Neg Hx    Allergies  Allergen Reactions  . Shellfish Allergy Shortness Of Breath and Other (See Comments)    Reaction=tunnel vision  . Amlodipine Besy-Benazepril Hcl     REACTION: Generic med did not adequatly control htnh  . Sertraline Hcl Nausea Only    REACTION: nausea, ED   Current Outpatient Prescriptions on File Prior to Visit  Medication Sig Dispense Refill  . aspirin 81 MG tablet Take 81 mg by mouth daily.        . Azelastine HCl (ASTEPRO) 0.15 % SOLN Place 1 spray into both nostrils 2 (two) times daily.  30 mL  5  . Fexofenadine HCl (ALLEGRA PO) Take by mouth. As directed        . fish oil-omega-3 fatty acids 1000 MG capsule Take 1 g by mouth daily.       . fluticasone (FLONASE) 50 MCG/ACT nasal spray Place 2 sprays into both nostrils daily as needed.  16 g  5  . hydrocortisone (ANUSOL-HC) 25 MG suppository Place 1 suppository (25 mg total) rectally every evening. As needed for 7 days as needed for rectal pain or bleeding  14 suppository  2  . Melatonin 3 MG TABS Take 2 tablets by mouth at bedtime.       . Multiple Vitamin (MULTIVITAMIN) tablet Take 1 tablet by mouth daily.         No current facility-administered medications on file prior to visit.    Review of Systems Review of Systems  Constitutional: Negative for fever, appetite change,  and unexpected weight change.pos for fatigue   Eyes:  Negative for pain and visual  disturbance.  Respiratory: Negative for cough and shortness of breath.   Cardiovascular: Negative for cp or palpitations    Gastrointestinal: Negative for nausea, diarrhea and constipation.  Genitourinary: Negative for urgency and frequency. pos for loss of libido  Skin: Negative for pallor or rash   Neurological: Negative for weakness, light-headedness, numbness and headaches.  Hematological: Negative for adenopathy. Does not bruise/bleed easily.  Psychiatric/Behavioral: Negative for dysphoric mood. The patient is not nervous/anxious.         Objective:   Physical Exam  Constitutional: He appears well-developed and well-nourished. No distress.  obese and well appearing   HENT:  Head: Normocephalic and atraumatic.  Right Ear: External ear normal.  Left Ear: External ear normal.  Nose: Nose normal.  Mouth/Throat: Oropharynx is clear and moist.  Eyes: Conjunctivae and EOM are normal. Pupils are equal, round, and reactive to light. Right eye exhibits no discharge. Left eye exhibits no discharge. No scleral icterus.  Neck: Normal range of motion. Neck supple. No JVD present. Carotid bruit is not present. No thyromegaly present.  Cardiovascular: Normal rate, regular rhythm, normal Hartman sounds and intact distal pulses.  Exam reveals no gallop.   Pulmonary/Chest: Effort normal and breath sounds normal. No respiratory distress. He has no wheezes. He exhibits no tenderness.  Abdominal: Soft. Bowel sounds are normal. He exhibits no distension, no abdominal bruit and no mass. There is no tenderness.  Musculoskeletal: He exhibits tenderness. He exhibits no edema.  Left foot-tender in arch and over calcaneous No achilles tenderness Nl rom No swelling  Favors other foot initially when walking  Nl perf and sensation   Lymphadenopathy:    He has no cervical adenopathy.  Neurological: He is alert. He has normal reflexes. No cranial nerve deficit. He exhibits normal  muscle tone. Coordination normal.  Skin: Skin is warm and dry. No rash noted. No erythema. No pallor.  Some lentigos  Psychiatric: He has a normal mood and affect.          Assessment & Plan:

## 2013-11-09 NOTE — Patient Instructions (Signed)
Stop at check out for urology referral  Xray of foot today  Keep working on healthy diet (and low impact exercise as tolerated) for weight loss

## 2013-11-09 NOTE — Progress Notes (Signed)
Pre visit review using our clinic review tool, if applicable. No additional management support is needed unless otherwise documented below in the visit note. 

## 2013-11-10 NOTE — Assessment & Plan Note (Signed)
Discussed how this problem influences overall health and the risks it imposes  Reviewed plan for weight loss with lower calorie diet (via better food choices and also portion control or program like weight watchers) and exercise building up to or more than 30 minutes 5 days per week including some aerobic activity    

## 2013-11-10 NOTE — Assessment & Plan Note (Signed)
Still suspect plantar fasciitis  Xray today to r/o deg change or stress fx

## 2013-11-10 NOTE — Assessment & Plan Note (Signed)
Reviewed health habits including diet and exercise and skin cancer prevention Reviewed appropriate screening tests for age  Also reviewed health mt list, fam hx and immunization status , as well as social and family history   Labs reviewed  

## 2013-11-10 NOTE — Assessment & Plan Note (Signed)
Lab Results  Component Value Date   PSA 0.92 11/02/2013    No symptoms or fam hx

## 2013-11-10 NOTE — Assessment & Plan Note (Signed)
Disc goals for lipids and reasons to control them Rev labs with pt Rev low sat fat diet in detail  Statin and diet -enc to continue exercise as tolerated

## 2013-11-10 NOTE — Assessment & Plan Note (Signed)
bp in fair control at this time  BP Readings from Last 1 Encounters:  11/09/13 118/76   No changes needed Disc lifstyle change with low sodium diet and exercise

## 2013-11-10 NOTE — Assessment & Plan Note (Signed)
Symptomatic Ref to urology to disc further

## 2013-11-14 ENCOUNTER — Encounter: Payer: Self-pay | Admitting: Family Medicine

## 2013-11-21 ENCOUNTER — Institutional Professional Consult (permissible substitution): Payer: Managed Care, Other (non HMO) | Admitting: Family Medicine

## 2014-04-14 ENCOUNTER — Encounter: Payer: Self-pay | Admitting: Gastroenterology

## 2014-05-19 ENCOUNTER — Ambulatory Visit: Payer: Managed Care, Other (non HMO) | Admitting: Family Medicine

## 2014-06-16 ENCOUNTER — Ambulatory Visit (INDEPENDENT_AMBULATORY_CARE_PROVIDER_SITE_OTHER): Payer: Managed Care, Other (non HMO) | Admitting: Family Medicine

## 2014-06-16 ENCOUNTER — Encounter: Payer: Self-pay | Admitting: Family Medicine

## 2014-06-16 VITALS — BP 128/74 | HR 66 | Temp 98.9°F | Ht 70.0 in | Wt 274.2 lb

## 2014-06-16 DIAGNOSIS — M5442 Lumbago with sciatica, left side: Secondary | ICD-10-CM

## 2014-06-16 DIAGNOSIS — H6120 Impacted cerumen, unspecified ear: Secondary | ICD-10-CM | POA: Insufficient documentation

## 2014-06-16 DIAGNOSIS — H6123 Impacted cerumen, bilateral: Secondary | ICD-10-CM

## 2014-06-16 NOTE — Progress Notes (Signed)
Pre visit review using our clinic review tool, if applicable. No additional management support is needed unless otherwise documented below in the visit note. 

## 2014-06-16 NOTE — Progress Notes (Signed)
Subjective:    Patient ID: Jeffrey Hartman, male    DOB: Dec 16, 1960, 53 y.o.   MRN: [id number]  HPI Here for cerumen impaction - getting a lot of wax out of them  ? 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 Low back pain with left-sided sciatica 06/16/2014  . Cerumen impaction 06/16/2014  . Left foot pain 11/09/2013  . Low testosterone 11/09/2013  . Prostate cancer screening 11/02/2013  . Adenomatous colon polyp 07/28/2012  . Family history of malignant neoplasm of gastrointestinal tract 06/29/2012  . Anal fissure 06/29/2012  . Personal history of colonic polyps 06/29/2012  . Routine general medical examination at a health care facility 09/17/2011  . OBESITY 01/01/2010  . ALLERGIC RHINITIS 10/13/2008  . OBSTRUCTIVE SLEEP APNEA 08/11/2007  . OBSESSIVE-COMPULSIVE DISORDER 01/21/2007  . ACTINIC SKIN DAMAGE 01/21/2007  . ONYCHOMYCOSIS 01/13/2007  . HYPERLIPIDEMIA 01/13/2007  . HYPERTENSION 01/13/2007   Past Medical History  Diagnosis Date  . Obstructive sleep apnea (adult) (pediatric)   . Unspecified dermatitis due to sun   . Obsessive-compulsive disorders   . Anxiety states   . Unspecified sleep apnea   . Dermatophytosis of nail   . Unspecified essential hypertension   . Other and unspecified hyperlipidemia   . Seasonal allergic rhinitis    Past Surgical History  Procedure Laterality Date  . Cystectomy      cyst on hand  . Colonoscopy  11/02, 11/05    polyps   History  Substance Use Topics  . Smoking status: Never Smoker    . Smokeless tobacco: Never Used  . Alcohol Use: Yes     Comment: occasionally,1 beer once month   Family History  Problem Relation Age of Onset  . Hypertension Father   . Diabetes type II Father   . Cancer - Other Father 2    Adrenal Cancer  . Colon cancer Mother 43  . Prostate cancer Neg Hx    Allergies  Allergen Reactions  . Shellfish Allergy Shortness Of Breath and Other (See Comments)    Reaction=tunnel vision  . Amlodipine Besy-Benazepril Hcl     REACTION: Generic med did not adequatly control htnh  . Sertraline Hcl Nausea Only    REACTION: nausea, ED   Current Outpatient Prescriptions on File Prior to Visit  Medication Sig Dispense Refill  . amLODipine-benazepril (LOTREL) 5-10 MG per capsule Take 1 capsule by mouth daily.  90 capsule  3  . aspirin 81 MG tablet Take 81 mg by mouth daily.        . Azelastine HCl (ASTEPRO) 0.15 % SOLN Place 1 spray into both nostrils 2 (two) times daily.  30 mL  5  . Fexofenadine HCl (ALLEGRA PO) Take by mouth. As directed        . fish oil-omega-3 fatty acids 1000 MG capsule Take 1 g by mouth daily.       . fluticasone (  FLONASE) 50 MCG/ACT nasal spray Place 2 sprays into both nostrils daily as needed.  16 g  5  . hydrocortisone (ANUSOL-HC) 25 MG suppository Place 1 suppository (25 mg total) rectally every evening. As needed for 7 days as needed for rectal pain or bleeding  14 suppository  2  . Melatonin 3 MG TABS Take 2 tablets by mouth at bedtime.       . Multiple Vitamin (MULTIVITAMIN) tablet Take 1 tablet by mouth daily.        . simvastatin (ZOCOR) 20 MG tablet Take 1 tablet (20 mg total) by mouth daily.  90 tablet  3   No current facility-administered medications on file prior to visit.     Review of Systems Review of Systems  Constitutional: Negative for fever, appetite change, fatigue and unexpected weight change.  Eyes: Negative for pain and visual disturbance.  Respiratory: Negative for cough and shortness of breath.     Cardiovascular: Negative for cp or palpitations    Gastrointestinal: Negative for nausea, diarrhea and constipation.  Genitourinary: Negative for urgency and frequency.  Skin: Negative for pallor or rash   MSK pos for L low back pain , neg for acute joint swelling or pain  Neurological: Negative for weakness, light-headedness, numbness and headaches.  Hematological: Negative for adenopathy. Does not bruise/bleed easily.  Psychiatric/Behavioral: Negative for dysphoric mood. The patient is not nervous/anxious.         Objective:   Physical Exam  Constitutional: He appears well-developed and well-nourished. No distress.  obese and well appearing  Wt loss noted from last visit however   HENT:  Head: Normocephalic and atraumatic.  Cerumen impaction bilaterally - worse on the L   Improved after irritation   Eyes: Conjunctivae and EOM are normal. Pupils are equal, round, and reactive to light.  Neck: Normal range of motion. Neck supple.  Cardiovascular: Normal rate, regular rhythm and normal heart sounds.   Pulmonary/Chest: Effort normal and breath sounds normal. No respiratory distress. He has no wheezes. He has no rales.  Musculoskeletal: He exhibits tenderness. He exhibits no edema.  No scoliosis or kyphosis  Neg SLR for leg pain  Nl rom hips No piriformis tenderness L peri lumbar muscle tenderness   Flex 90 deg/ext 10 deg  Some pain on R flex  Nl gait No neurol changes   Lymphadenopathy:    He has no cervical adenopathy.  Neurological: He is alert. He has normal reflexes.  Skin: Skin is warm and dry. No rash noted. No erythema.  Psychiatric: He has a normal mood and affect.          Assessment & Plan:   Problem List Items Addressed This Visit     Nervous and Auditory   Cerumen impaction     Improved after simple ear irrigation- L ear still has scant cerumen that I expect will work itself out in the next week or so  R canal is clear  Adv to avoid Q tips Also try  debrox or other product once monthly  Update if no further improvement       Other   Low back pain with left-sided sciatica - Primary     Relieved by walking  No neuro findings Ref to PT eval and treat - suspect cross training would also help      Relevant Orders      Ambulatory referral to Physical Therapy

## 2014-06-16 NOTE — Assessment & Plan Note (Signed)
Relieved by walking  No neuro findings Ref to PT eval and treat - suspect cross training would also help

## 2014-06-16 NOTE — Patient Instructions (Addendum)
Ear irrigation today - ear canals are improved- expect to get more out of left ear in the next week  Avoid q tips  Get an ear wax softening product like debrox-use it once a month - this softens wax so it releases more easily  Put heat on your back when it bothers you if you can  Keep walking  Stop at check out for PT referral

## 2014-06-16 NOTE — Assessment & Plan Note (Signed)
Improved after simple ear irrigation- L ear still has scant cerumen that I expect will work itself out in the next week or so  R canal is clear  Adv to avoid Q tips Also try debrox or other product once monthly  Update if no further improvement

## 2014-06-29 ENCOUNTER — Ambulatory Visit: Payer: Managed Care, Other (non HMO) | Admitting: Physical Therapy

## 2014-07-11 ENCOUNTER — Emergency Department (HOSPITAL_COMMUNITY): Payer: Managed Care, Other (non HMO)

## 2014-07-11 ENCOUNTER — Encounter (HOSPITAL_COMMUNITY): Payer: Self-pay

## 2014-07-11 ENCOUNTER — Telehealth: Payer: Self-pay

## 2014-07-11 ENCOUNTER — Ambulatory Visit: Payer: Managed Care, Other (non HMO) | Admitting: Family Medicine

## 2014-07-11 ENCOUNTER — Emergency Department (HOSPITAL_COMMUNITY)
Admission: EM | Admit: 2014-07-11 | Discharge: 2014-07-11 | Disposition: A | Discharge status: Home / Self Care | Payer: Managed Care, Other (non HMO) | Attending: Emergency Medicine | Admitting: Emergency Medicine

## 2014-07-11 DIAGNOSIS — J302 Other seasonal allergic rhinitis: Secondary | ICD-10-CM | POA: Insufficient documentation

## 2014-07-11 DIAGNOSIS — I10 Essential (primary) hypertension: Secondary | ICD-10-CM | POA: Diagnosis not present

## 2014-07-11 DIAGNOSIS — Z8619 Personal history of other infectious and parasitic diseases: Secondary | ICD-10-CM | POA: Diagnosis not present

## 2014-07-11 DIAGNOSIS — Z7952 Long term (current) use of systemic steroids: Secondary | ICD-10-CM | POA: Insufficient documentation

## 2014-07-11 DIAGNOSIS — G4733 Obstructive sleep apnea (adult) (pediatric): Secondary | ICD-10-CM | POA: Diagnosis not present

## 2014-07-11 DIAGNOSIS — R109 Unspecified abdominal pain: Secondary | ICD-10-CM | POA: Insufficient documentation

## 2014-07-11 DIAGNOSIS — Z872 Personal history of diseases of the skin and subcutaneous tissue: Secondary | ICD-10-CM | POA: Diagnosis not present

## 2014-07-11 DIAGNOSIS — N200 Calculus of kidney: Secondary | ICD-10-CM | POA: Insufficient documentation

## 2014-07-11 DIAGNOSIS — Z79899 Other long term (current) drug therapy: Secondary | ICD-10-CM | POA: Insufficient documentation

## 2014-07-11 DIAGNOSIS — Z7951 Long term (current) use of inhaled steroids: Secondary | ICD-10-CM | POA: Insufficient documentation

## 2014-07-11 DIAGNOSIS — E785 Hyperlipidemia, unspecified: Secondary | ICD-10-CM | POA: Insufficient documentation

## 2014-07-11 DIAGNOSIS — Z7982 Long term (current) use of aspirin: Secondary | ICD-10-CM | POA: Diagnosis not present

## 2014-07-11 DIAGNOSIS — Z8659 Personal history of other mental and behavioral disorders: Secondary | ICD-10-CM | POA: Insufficient documentation

## 2014-07-11 DIAGNOSIS — N23 Unspecified renal colic: Secondary | ICD-10-CM

## 2014-07-11 LAB — URINALYSIS, ROUTINE W REFLEX MICROSCOPIC
Bilirubin Urine: NEGATIVE
Glucose, UA: NEGATIVE mg/dL
KETONES UR: 15 mg/dL — AB
NITRITE: NEGATIVE
Protein, ur: 100 mg/dL — AB
Specific Gravity, Urine: 1.018 (ref 1.005–1.030)
UROBILINOGEN UA: 0.2 mg/dL (ref 0.0–1.0)
pH: 8 (ref 5.0–8.0)

## 2014-07-11 LAB — URINE MICROSCOPIC-ADD ON

## 2014-07-11 LAB — COMPREHENSIVE METABOLIC PANEL
ALT: 26 U/L (ref 0–53)
ANION GAP: 20 — AB (ref 5–15)
AST: 28 U/L (ref 0–37)
Albumin: 4.4 g/dL (ref 3.5–5.2)
Alkaline Phosphatase: 47 U/L (ref 39–117)
BILIRUBIN TOTAL: 0.5 mg/dL (ref 0.3–1.2)
BUN: 15 mg/dL (ref 6–23)
CO2: 20 mEq/L (ref 19–32)
CREATININE: 1.2 mg/dL (ref 0.50–1.35)
Calcium: 10 mg/dL (ref 8.4–10.5)
Chloride: 104 mEq/L (ref 96–112)
GFR calc non Af Amer: 68 mL/min — ABNORMAL LOW (ref >90)
GFR, EST AFRICAN AMERICAN: 79 mL/min — AB (ref >90)
Glucose, Bld: 103 mg/dL — ABNORMAL HIGH (ref 70–99)
Potassium: 4.6 mEq/L (ref 3.7–5.3)
Sodium: 144 mEq/L (ref 137–147)
Total Protein: 7.6 g/dL (ref 6.0–8.3)

## 2014-07-11 MED ORDER — HYDROMORPHONE HCL 1 MG/ML IJ SOLN
0.5000 mg | Freq: Once | INTRAMUSCULAR | Status: AC
  Administered 2014-07-11: 0.5 mg via INTRAVENOUS
  Filled 2014-07-11: qty 1

## 2014-07-11 MED ORDER — OXYCODONE-ACETAMINOPHEN 5-325 MG PO TABS: ORAL_TABLET | ORAL | Status: DC

## 2014-07-11 MED ORDER — ONDANSETRON 4 MG PO TBDP
4.0000 mg | ORAL_TABLET | Freq: Once | ORAL | Status: AC
  Administered 2014-07-11: 4 mg via ORAL
  Filled 2014-07-11: qty 1

## 2014-07-11 MED ORDER — HYDROMORPHONE HCL 1 MG/ML IJ SOLN
1.0000 mg | INTRAMUSCULAR | Status: DC | PRN
  Administered 2014-07-11: 1 mg via INTRAVENOUS
  Filled 2014-07-11: qty 1

## 2014-07-11 MED ORDER — ONDANSETRON HCL 4 MG/2ML IJ SOLN: 4.0000 mg | Freq: Once | INTRAMUSCULAR | Status: DC

## 2014-07-11 MED ORDER — SODIUM CHLORIDE 0.9 % IV SOLN
INTRAVENOUS | Status: DC
  Administered 2014-07-11: 1000 mL via INTRAVENOUS

## 2014-07-11 MED ORDER — KETOROLAC TROMETHAMINE 15 MG/ML IJ SOLN
15.0000 mg | Freq: Once | INTRAMUSCULAR | Status: AC
  Administered 2014-07-11: 15 mg via INTRAVENOUS
  Filled 2014-07-11: qty 1

## 2014-07-11 MED ORDER — OXYCODONE-ACETAMINOPHEN 5-325 MG PO TABS
1.0000 | ORAL_TABLET | Freq: Once | ORAL | Status: AC
  Administered 2014-07-11: 1 via ORAL
  Filled 2014-07-11: qty 1

## 2014-07-11 NOTE — ED Notes (Signed)
Gave Patient a strainer to strain urine.

## 2014-07-11 NOTE — ED Notes (Signed)
D&C IV 

## 2014-07-11 NOTE — ED Provider Notes (Signed)
CSN: [id number]     Arrival date & time 07/11/14  0915 History   First MD Initiated Contact with Patient 07/11/14 [phone]     Chief Complaint  Patient presents with  . Flank Pain     (Consider location/radiation/quality/duration/timing/severity/associated sxs/prior Treatment) HPI   Jeffrey Hartman is a 53 y.o. male complaining of Acute onset of severe left-sided flank pain radiating to the groin this morning. Patient has history of kidney stones states that this feels similar except it is much more severe. He is seeing Dr. Gaynelle Arabian in the past for this. He has not required any intervention such as lithotripsy or stent placements to pass the stones in the past. He denies fever, chills, nausea, vomiting, dysuria, hematuria, difficulty urinating. Pain is severe, 10 out of 10 and  No identifying or alleviating factors are identified. No pain medication as taken prior to arrival  Past Medical History  Diagnosis Date  . Obstructive sleep apnea (adult) (pediatric)   . Unspecified dermatitis due to sun   . Obsessive-compulsive disorders   . Anxiety states   . Unspecified sleep apnea   . Dermatophytosis of nail   . Unspecified essential hypertension   . Other and unspecified hyperlipidemia   . Seasonal allergic rhinitis    Past Surgical History  Procedure Laterality Date  . Cystectomy      cyst on hand  . Colonoscopy  11/02, 11/05    polyps   Family History  Problem Relation Age of Onset  . Hypertension Father   . Diabetes type II Father   . Cancer - Other Father 14    Adrenal Cancer  . Colon cancer Mother 87  . Prostate cancer Neg Hx    History  Substance Use Topics  . Smoking status: Never Smoker   . Smokeless tobacco: Never Used  . Alcohol Use: Yes     Comment: occasionally,1 beer once month    Review of Systems  10 systems reviewed and found to be negative, except as noted in the HPI.   Allergies  Shellfish allergy; Amlodipine besy-benazepril hcl; and Sertraline  hcl  Home Medications   Prior to Admission medications   Medication Sig Start Date End Date Taking? Authorizing Provider  amLODipine-benazepril (LOTREL) 5-10 MG per capsule Take 1 capsule by mouth daily. 11/09/13  Yes Abner Greenspan, MD  aspirin 81 MG tablet Take 81 mg by mouth daily.     Yes Historical Provider, MD  Azelastine HCl (ASTEPRO) 0.15 % SOLN Place 1 spray into both nostrils 2 (two) times daily. Patient taking differently: Place 1 spray into both nostrils 2 (two) times daily as needed (for sinus).  07/29/13  Yes Abner Greenspan, MD  clomiPHENE (CLOMID) 50 MG tablet Take 50 mg by mouth at bedtime. 05/18/14  Yes Historical Provider, MD  Fexofenadine HCl (ALLEGRA PO) Take 1 tablet by mouth daily. As directed   Yes Historical Provider, MD  fish oil-omega-3 fatty acids 1000 MG capsule Take 1 g by mouth daily.    Yes Historical Provider, MD  fluticasone (FLONASE) 50 MCG/ACT nasal spray Place 2 sprays into both nostrils daily as needed. Patient taking differently: Place 2 sprays into both nostrils daily as needed for allergies.  07/29/13  Yes Abner Greenspan, MD  Melatonin 3 MG TABS Take 2 tablets by mouth at bedtime.    Yes Historical Provider, MD  Melatonin 5 MG TABS Take 10 mg by mouth at bedtime.   Yes Historical Provider, MD  simvastatin (ZOCOR) 20  MG tablet Take 1 tablet (20 mg total) by mouth daily. 11/09/13  Yes Abner Greenspan, MD  hydrocortisone (ANUSOL-HC) 25 MG suppository Place 1 suppository (25 mg total) rectally every evening. As needed for 7 days as needed for rectal pain or bleeding 06/29/12   Inda Castle, MD  Multiple Vitamin (MULTIVITAMIN) tablet Take 1 tablet by mouth daily.      Historical Provider, MD  oxyCODONE-acetaminophen (PERCOCET/ROXICET) 5-325 MG per tablet 1 to 2 tabs PO q6hrs  PRN for pain 07/11/14   Daquana Paddock, PA-C   BP 135/71 mmHg  Pulse 63  Temp(Src) 97.3 F (36.3 C) (Oral)  Resp 18  Ht 5\' 11"  (1.803 m)  Wt 267 lb (121.11 kg)  BMI 37.26 kg/m2  SpO2  95% Physical Exam  Constitutional: He is oriented to person, place, and time. He appears well-developed and well-nourished. No distress.  Pacing, won't sit still, appears acutely uncomfortable.  HENT:  Head: Normocephalic and atraumatic.  Mouth/Throat: Oropharynx is clear and moist.  Eyes: Conjunctivae and EOM are normal. Pupils are equal, round, and reactive to light.  Cardiovascular: Normal rate, regular rhythm and intact distal pulses.   Pulmonary/Chest: Effort normal and breath sounds normal. No stridor.  Abdominal: Soft. There is no tenderness.  Genitourinary:  No CVA tenderness palpation bilaterally.  Musculoskeletal: Normal range of motion.  Neurological: He is alert and oriented to person, place, and time.  Psychiatric: He has a normal mood and affect.  Nursing note and vitals reviewed.   ED Course  Procedures (including critical care time) Labs Review Labs Reviewed  COMPREHENSIVE METABOLIC PANEL - Abnormal; Notable for the following:    Glucose, Bld 103 (*)    GFR calc non Af Amer 68 (*)    GFR calc Af Amer 79 (*)    Anion gap 20 (*)    All other components within normal limits  URINALYSIS, ROUTINE W REFLEX MICROSCOPIC - Abnormal; Notable for the following:    Color, Urine RED (*)    APPearance CLOUDY (*)    Hgb urine dipstick LARGE (*)    Ketones, ur 15 (*)    Protein, ur 100 (*)    Leukocytes, UA SMALL (*)    All other components within normal limits  URINE MICROSCOPIC-ADD ON - Abnormal; Notable for the following:    Bacteria, UA FEW (*)    All other components within normal limits    Imaging Review US Renal  07/11/2014   CLINICAL DATA:  Left flank pain  EXAM: RENAL/URINARY TRACT ULTRASOUND COMPLETE  COMPARISON:  CT abdomen and pelvis Jan 09, 2009  FINDINGS: Right Kidney:  Length: 12.7 cm. Echogenicity and renal cortical thickness are within normal limits. No mass, perinephric fluid, or pelvicaliectasis visualized. No sonographically demonstrable calculus or  ureterectasis.  Left Kidney:  Length: 11.6 cm. Echogenicity and renal cortical thickness are within normal limits. No mass or perinephric fluid visualized. There is minimal fullness of the left renal collecting system. No sonographically demonstrable calculus or ureterectasis seen.  Bladder:  Appears normal for degree of bladder distention.  IMPRESSION: Minimal fullness of the left renal collecting system without obstructing focus appreciable. Study otherwise unremarkable.   Electronically Signed   By: Lowella Grip M.D.   On: 07/11/2014 11:53     EKG Interpretation None      MDM   Final diagnoses:  Left flank pain  Renal colic on left side    Filed Vitals:   07/11/14 1000 07/11/14 1015 07/11/14 1030 07/11/14 1205  BP: 139/71 130/70 122/69 135/71  Pulse: 64 63 56 63  Temp:      TempSrc:      Resp:    18  Height:      Weight:      SpO2: 100% 100% 97% 95%    Medications  HYDROmorphone (DILAUDID) injection 1 mg (1 mg Intravenous Given 07/11/14 0931)  ondansetron (ZOFRAN) injection 4 mg (4 mg Intravenous Not Given 07/11/14 1043)  0.9 %  sodium chloride infusion ( Intravenous Stopped 07/11/14 1047)  HYDROmorphone (DILAUDID) injection 0.5 mg (0.5 mg Intravenous Given 07/11/14 0939)  ketorolac (TORADOL) 15 MG/ML injection 15 mg (15 mg Intravenous Given 07/11/14 1041)  oxyCODONE-acetaminophen (PERCOCET/ROXICET) 5-325 MG per tablet 1 tablet (1 tablet Oral Given 07/11/14 1040)  ondansetron (ZOFRAN-ODT) disintegrating tablet 4 mg (4 mg Oral Given 07/11/14 1042)    SAVIER TRICKETT is a 53 y.o. male presenting with left flank pain radiating to left groin consistent with prior episodes of kidney stones. Patient has never required intervention to pass a stone, follows with urologist Dr. Gaynelle Arabian patient afebrile, nonseptic appearing tolerating by mouth.urinalysis is not suspicious for infection.renal function within normal limits. Ultrasound shows a mild fullness in the left collecting  system, no severe hydronephrosis.  Patient has an anion gap of 20. He has received a liter of fluid in the ED. I have discussed with attending physician who agrees with stability to discharge to home.  Evaluation does not show pathology that would require ongoing emergent intervention or inpatient treatment. Pt is hemodynamically stable and mentating appropriately. Discussed findings and plan with patient/guardian, who agrees with care plan. All questions answered. Return precautions discussed and outpatient follow up given.   New Prescriptions   OXYCODONE-ACETAMINOPHEN (PERCOCET/ROXICET) 5-325 MG PER TABLET    1 to 2 tabs PO q6hrs  PRN for pain         Monico Blitz, PA-C 07/11/14 [place], MD 07/11/14 1545

## 2014-07-11 NOTE — ED Notes (Signed)
Updated pt. With plan of care

## 2014-07-11 NOTE — Telephone Encounter (Signed)
Pt walked in with lower lt back pain that started this morning while on pts way to work; 1st available appt at [facility] 10:45 AM; pt having lt lower back pain that runs to the lower lt abd; pain level now 9 - 10. Pt voiding OK and has not see blood in urine. Pt had kidney stone 2 years ago; pt was seen 06/16/14 by Dr Glori Bickers for back pain but pt said not the same pain. Dr Glori Bickers advised pt to go to ED for possible scan and pain mgt. Pt said he would go to [facility] ED now. Pt said he was OK to drive; offered to call family member or EMS but pt said he was OK to drive and pt ask that I call [place]; spoke with Janett Billow in [facility] triage.

## 2014-07-11 NOTE — Discharge Instructions (Signed)
Push fluids: take small frequent sips of water or Gatorade, do not drink any soda, juice or caffeinated beverages.  For pain control please take ibuprofen (also known as Motrin or Advil) 800mg  (this is normally 4 over the counter pills) 3 times a day  for 5 days. Take with food to minimize stomach irritation.  Take percocet for breakthrough pain, do not drink alcohol, drive, care for children or do other critical tasks while taking percocet.  Strain all urine and retain any stone for analysis at your urologist.   Return to the ED if you develop fever, have vomiting that is not controlled with the medication or if pain becomes too severe.

## 2014-07-11 NOTE — ED Notes (Signed)
Pt. Got into his car this am and began having lt. Flank pain, pelvic pain.  Drove himself to work , left and went to his MD and they sent him to [place].  HX of kidney stones , Denies any vomiting is nausea

## 2014-09-22 ENCOUNTER — Ambulatory Visit: Payer: Managed Care, Other (non HMO) | Admitting: Pulmonary Disease

## 2014-10-13 ENCOUNTER — Encounter: Payer: Self-pay | Admitting: Pulmonary Disease

## 2014-10-13 ENCOUNTER — Ambulatory Visit (INDEPENDENT_AMBULATORY_CARE_PROVIDER_SITE_OTHER): Payer: Managed Care, Other (non HMO) | Admitting: Pulmonary Disease

## 2014-10-13 VITALS — BP 122/76 | HR 66 | Temp 97.5°F | Ht 72.0 in | Wt 269.2 lb

## 2014-10-13 DIAGNOSIS — G4733 Obstructive sleep apnea (adult) (pediatric): Secondary | ICD-10-CM

## 2014-10-13 NOTE — Patient Instructions (Signed)
Continue on weight loss, you are doing great. Keep up with mask changes and supplies. followup with me again in one year.

## 2014-10-13 NOTE — Progress Notes (Signed)
   Subjective:    Patient ID: Jeffrey Hartman, male    DOB: 01-07-1961, 54 y.o.   MRN: [id number]  HPI The patient comes in today for follow-up of his obstructive sleep apnea. Wearing C Pap compliantly, and continues to do very well with the device. He denies any issues with mask fit or pressure, satisfied with his daytime alertness. Of note, he has lost significant weight since last visit.   Review of Systems  Constitutional: Negative for fever and unexpected weight change.  HENT: Negative for congestion, dental problem, ear pain, nosebleeds, postnasal drip, rhinorrhea, sinus pressure, sneezing, sore throat and trouble swallowing.   Eyes: Negative for redness and itching.  Respiratory: Negative for cough, chest tightness, shortness of breath and wheezing.   Cardiovascular: Negative for palpitations and leg swelling.  Gastrointestinal: Negative for nausea and vomiting.  Genitourinary: Negative for dysuria.  Musculoskeletal: Negative for joint swelling.  Skin: Negative for rash.  Neurological: Negative for headaches.  Hematological: Does not bruise/bleed easily.  Psychiatric/Behavioral: Negative for dysphoric mood. The patient is not nervous/anxious.        Objective:   Physical Exam Overweight male in no acute distress Nose without purulence or discharge noted Neck without lymphadenopathy or thyromegaly No skin breakdown or pressure necrosis from the C Pap mask Lower extremities without edema, no cyanosis Alert and oriented, does not appear to be sleepy, moves all 4 extremities.       Assessment & Plan:

## 2014-10-13 NOTE — Assessment & Plan Note (Signed)
The patient continues to do very well with C Pap, and has even lost more weight from the last visit. He is sleeping well with his device, and has been keeping up with his mask changes and supplies.

## 2014-10-28 ENCOUNTER — Other Ambulatory Visit: Payer: Self-pay | Admitting: Family Medicine

## 2014-11-03 ENCOUNTER — Other Ambulatory Visit: Payer: Managed Care, Other (non HMO)

## 2014-11-10 ENCOUNTER — Encounter: Payer: Managed Care, Other (non HMO) | Admitting: Family Medicine

## 2014-12-03 ENCOUNTER — Other Ambulatory Visit: Payer: Self-pay | Admitting: Family Medicine

## 2015-01-18 ENCOUNTER — Other Ambulatory Visit: Payer: Self-pay | Admitting: Family Medicine

## 2015-01-22 ENCOUNTER — Telehealth: Payer: Self-pay | Admitting: Family Medicine

## 2015-01-22 DIAGNOSIS — Z0000 Encounter for general adult medical examination without abnormal findings: Secondary | ICD-10-CM

## 2015-01-22 DIAGNOSIS — Z125 Encounter for screening for malignant neoplasm of prostate: Secondary | ICD-10-CM

## 2015-01-22 NOTE — Telephone Encounter (Signed)
-----   Message from Marchia Bond sent at 01/22/2015  2:05 PM EDT ----- Regarding: cpx labs tomorrow 5/24, need orders please :-) Please order  future cpx labs for pt's upcoming lab appt. Thanks Aniceto Boss

## 2015-01-23 ENCOUNTER — Other Ambulatory Visit (INDEPENDENT_AMBULATORY_CARE_PROVIDER_SITE_OTHER): Payer: Managed Care, Other (non HMO)

## 2015-01-23 DIAGNOSIS — Z125 Encounter for screening for malignant neoplasm of prostate: Secondary | ICD-10-CM | POA: Diagnosis not present

## 2015-01-23 DIAGNOSIS — Z0000 Encounter for general adult medical examination without abnormal findings: Secondary | ICD-10-CM

## 2015-01-23 LAB — CBC WITH DIFFERENTIAL/PLATELET
Basophils Absolute: 0 10*3/uL (ref 0.0–0.1)
Basophils Relative: 0.5 % (ref 0.0–3.0)
Eosinophils Absolute: 0.3 10*3/uL (ref 0.0–0.7)
Eosinophils Relative: 4 % (ref 0.0–5.0)
HCT: 42.5 % (ref 39.0–52.0)
Hemoglobin: 14.5 g/dL (ref 13.0–17.0)
Lymphocytes Relative: 24.1 % (ref 12.0–46.0)
Lymphs Abs: 1.5 10*3/uL (ref 0.7–4.0)
MCHC: 34.1 g/dL (ref 30.0–36.0)
MCV: 87 fl (ref 78.0–100.0)
Monocytes Absolute: 0.6 10*3/uL (ref 0.1–1.0)
Monocytes Relative: 9.7 % (ref 3.0–12.0)
Neutro Abs: 3.9 10*3/uL (ref 1.4–7.7)
Neutrophils Relative %: 61.7 % (ref 43.0–77.0)
Platelets: 223 10*3/uL (ref 150.0–400.0)
RBC: 4.89 Mil/uL (ref 4.22–5.81)
RDW: 13.7 % (ref 11.5–15.5)
WBC: 6.4 10*3/uL (ref 4.0–10.5)

## 2015-01-23 LAB — COMPREHENSIVE METABOLIC PANEL
ALT: 20 U/L (ref 0–53)
AST: 18 U/L (ref 0–37)
Albumin: 4.2 g/dL (ref 3.5–5.2)
Alkaline Phosphatase: 44 U/L (ref 39–117)
BUN: 16 mg/dL (ref 6–23)
CO2: 29 mEq/L (ref 19–32)
Calcium: 9 mg/dL (ref 8.4–10.5)
Chloride: 102 mEq/L (ref 96–112)
Creatinine, Ser: 1.12 mg/dL (ref 0.40–1.50)
GFR: 73.17 mL/min (ref >60.00)
Glucose, Bld: 91 mg/dL (ref 70–99)
Potassium: 4.5 mEq/L (ref 3.5–5.1)
Sodium: 137 mEq/L (ref 135–145)
Total Bilirubin: 0.4 mg/dL (ref 0.2–1.2)
Total Protein: 6.6 g/dL (ref 6.0–8.3)

## 2015-01-23 LAB — PSA: PSA: 1.39 ng/mL (ref 0.10–4.00)

## 2015-01-23 LAB — LIPID PANEL
Cholesterol: 159 mg/dL (ref 0–200)
HDL: 31.7 mg/dL — ABNORMAL LOW (ref >39.00)
LDL Cholesterol: 99 mg/dL (ref 0–99)
NonHDL: 127.3
Total CHOL/HDL Ratio: 5
Triglycerides: 142 mg/dL (ref 0.0–149.0)
VLDL: 28.4 mg/dL (ref 0.0–40.0)

## 2015-01-23 LAB — TSH: TSH: 1.3 u[IU]/mL (ref 0.35–4.50)

## 2015-01-26 ENCOUNTER — Ambulatory Visit (INDEPENDENT_AMBULATORY_CARE_PROVIDER_SITE_OTHER): Payer: Managed Care, Other (non HMO) | Admitting: Family Medicine

## 2015-01-26 ENCOUNTER — Encounter: Payer: Self-pay | Admitting: Family Medicine

## 2015-01-26 VITALS — BP 122/68 | HR 62 | Temp 98.7°F | Ht 70.0 in | Wt 269.5 lb

## 2015-01-26 DIAGNOSIS — I10 Essential (primary) hypertension: Secondary | ICD-10-CM

## 2015-01-26 DIAGNOSIS — Z125 Encounter for screening for malignant neoplasm of prostate: Secondary | ICD-10-CM | POA: Diagnosis not present

## 2015-01-26 DIAGNOSIS — Z1159 Encounter for screening for other viral diseases: Secondary | ICD-10-CM | POA: Insufficient documentation

## 2015-01-26 DIAGNOSIS — Z114 Encounter for screening for human immunodeficiency virus [HIV]: Secondary | ICD-10-CM

## 2015-01-26 DIAGNOSIS — Z0000 Encounter for general adult medical examination without abnormal findings: Secondary | ICD-10-CM

## 2015-01-26 DIAGNOSIS — E669 Obesity, unspecified: Secondary | ICD-10-CM | POA: Diagnosis not present

## 2015-01-26 DIAGNOSIS — E785 Hyperlipidemia, unspecified: Secondary | ICD-10-CM

## 2015-01-26 MED ORDER — ESCITALOPRAM OXALATE 20 MG PO TABS: 20.0000 mg | ORAL_TABLET | Freq: Every day | ORAL | Status: DC

## 2015-01-26 MED ORDER — SIMVASTATIN 20 MG PO TABS: 20.0000 mg | ORAL_TABLET | Freq: Every day | ORAL | Status: DC

## 2015-01-26 MED ORDER — LOTREL 5-10 MG PO CAPS: 1.0000 | ORAL_CAPSULE | Freq: Every day | ORAL | Status: DC

## 2015-01-26 NOTE — Patient Instructions (Signed)
Labs are stable  Continue current medicines Work on diet/exercise for weight loss and better sleep and health  Lab for HIV and Hep C screening

## 2015-01-26 NOTE — Progress Notes (Signed)
Subjective:    Patient ID: Jeffrey Hartman, male    DOB: 01-19-1961, 54 y.o.   MRN: [id number]  HPI Here for health maintenance exam and to review chronic medical problems    Has been feeling well overall  Had a kidney stone in Nov - saw Dr Gaynelle Arabian - told he needed to drink water (2.5 more liters a day) Hard to get it all in  Doing well so far  (did notice more hand cramps and foot cramps with more water) - so he started adding electrolytes in water   Wt is stable  Is "trying" to eat healthy - changed the way he eats at home  Walks 3-3.5 miles M-F  bmi 38 Obese  HIV/ Hep C screen- is interested in that   Flu shot 10 /15   colonosc 11/13 with 5 y f/u for polyps   Td 4/14  Prostate screen Lab Results  Component Value Date   PSA 1.39 01/23/2015   PSA 0.92 11/02/2013   urologist examines his prostate   Taking clomid for low testosterone  Seems to be working better - happy with that      Results for orders placed or performed in visit on 01/23/15  CBC with Differential/Platelet  Result Value Ref Range   WBC 6.4 4.0 - 10.5 K/uL   RBC 4.89 4.22 - 5.81 Mil/uL   Hemoglobin 14.5 13.0 - 17.0 g/dL   HCT 42.5 39.0 - 52.0 %   MCV 87.0 78.0 - 100.0 fl   MCHC 34.1 30.0 - 36.0 g/dL   RDW 13.7 11.5 - 15.5 %   Platelets 223.0 150.0 - 400.0 K/uL   Neutrophils Relative % 61.7 43.0 - 77.0 %   Lymphocytes Relative 24.1 12.0 - 46.0 %   Monocytes Relative 9.7 3.0 - 12.0 %   Eosinophils Relative 4.0 0.0 - 5.0 %   Basophils Relative 0.5 0.0 - 3.0 %   Neutro Abs 3.9 1.4 - 7.7 K/uL   Lymphs Abs 1.5 0.7 - 4.0 K/uL   Monocytes Absolute 0.6 0.1 - 1.0 K/uL   Eosinophils Absolute 0.3 0.0 - 0.7 K/uL   Basophils Absolute 0.0 0.0 - 0.1 K/uL  Comprehensive metabolic panel  Result Value Ref Range   Sodium 137 135 - 145 mEq/L   Potassium 4.5 3.5 - 5.1 mEq/L   Chloride 102 96 - 112 mEq/L   CO2 29 19 - 32 mEq/L   Glucose, Bld 91 70 - 99 mg/dL   BUN 16 6 - 23 mg/dL   Creatinine, Ser  1.12 0.40 - 1.50 mg/dL   Total Bilirubin 0.4 0.2 - 1.2 mg/dL   Alkaline Phosphatase 44 39 - 117 U/L   AST 18 0 - 37 U/L   ALT 20 0 - 53 U/L   Total Protein 6.6 6.0 - 8.3 g/dL   Albumin 4.2 3.5 - 5.2 g/dL   Calcium 9.0 8.4 - 10.5 mg/dL   GFR 73.17 >60.00 mL/min  Lipid panel  Result Value Ref Range   Cholesterol 159 0 - 200 mg/dL   Triglycerides 142.0 0.0 - 149.0 mg/dL   HDL 31.70 (L) >39.00 mg/dL   VLDL 28.4 0.0 - 40.0 mg/dL   LDL Cholesterol 99 0 - 99 mg/dL   Total CHOL/HDL Ratio 5    NonHDL 127.30   TSH  Result Value Ref Range   TSH 1.30 0.35 - 4.50 uIU/mL  PSA  Result Value Ref Range   PSA 1.39 0.10 - 4.00 ng/mL  Cholesterol  Lab Results  Component Value Date   CHOL 159 01/23/2015   CHOL 159 11/02/2013   CHOL 127 10/29/2012   Lab Results  Component Value Date   HDL 31.70* 01/23/2015   HDL 32.90* 11/02/2013   HDL 28.20* 10/29/2012   Lab Results  Component Value Date   LDLCALC 99 01/23/2015   LDLCALC 94 11/02/2013   LDLCALC 75 10/29/2012   Lab Results  Component Value Date   TRIG 142.0 01/23/2015   TRIG 163.0* 11/02/2013   TRIG 117.0 10/29/2012   Lab Results  Component Value Date   CHOLHDL 5 01/23/2015   CHOLHDL 5 11/02/2013   CHOLHDL 5 10/29/2012   Lab Results  Component Value Date   LDLDIRECT 109.0 09/21/2009    Not a big changes  Exercising for HDL  Stays away from fried foods   bp is stable today  No cp or palpitations or headaches or edema  No side effects to medicines  BP Readings from Last 3 Encounters:  01/26/15 122/68  10/13/14 122/76  07/11/14 135/71     Tried to go off lexapro for a while for OCD / anxiety (symptoms got much worse)  - now back on it and helping  No more stressors than usual     Review of Systems Review of Systems  Constitutional: Negative for fever, appetite change, fatigue and unexpected weight change.  Eyes: Negative for pain and visual disturbance.  Respiratory: Negative for cough and shortness of  breath.   Cardiovascular: Negative for cp or palpitations    Gastrointestinal: Negative for nausea, diarrhea and occ pos for constipation and ext hemorrhoid that itches (not right now)-occ takes stool softener Genitourinary: Negative for urgency and frequency.  Skin: Negative for pallor or rash  occ foot itching from athlete's foot  MSK pos for occ arthritis pain/stiffness in hands  Neurological: Negative for weakness, light-headedness, numbness and headaches.  Hematological: Negative for adenopathy. Does not bruise/bleed easily.  Psychiatric/Behavioral: Negative for dysphoric mood. The patient is not nervous/anxious.  pos for occasional sleep problems (improved now)        Objective:   Physical Exam  Constitutional: He appears well-developed and well-nourished. No distress.  obese and well appearing   HENT:  Head: Normocephalic and atraumatic.  Right Ear: External ear normal.  Left Ear: External ear normal.  Nose: Nose normal.  Mouth/Throat: Oropharynx is clear and moist.  Eyes: Conjunctivae and EOM are normal. Pupils are equal, round, and reactive to light. Right eye exhibits no discharge. Left eye exhibits no discharge. No scleral icterus.  Neck: Normal range of motion. Neck supple. No JVD present. Carotid bruit is not present. No thyromegaly present.  Cardiovascular: Normal rate, regular rhythm, normal heart sounds and intact distal pulses.  Exam reveals no gallop.   Pulmonary/Chest: Effort normal and breath sounds normal. No respiratory distress. He has no wheezes. He exhibits no tenderness.  Abdominal: Soft. Bowel sounds are normal. He exhibits no distension, no abdominal bruit and no mass. There is no tenderness.  Musculoskeletal: He exhibits no edema or tenderness.  Lymphadenopathy:    He has no cervical adenopathy.  Neurological: He is alert. He has normal reflexes. No cranial nerve deficit. He exhibits normal muscle tone. Coordination normal.  Skin: Skin is warm and dry. No  rash noted. No erythema. No pallor.  Psychiatric: He has a normal mood and affect.          Assessment & Plan:   Problem List Items Addressed This Visit  Essential hypertension - Primary    bp in fair control at this time  BP Readings from Last 1 Encounters:  01/26/15 122/68   No changes needed Disc lifstyle change with low sodium diet and exercise  Labs reviewed       Relevant Medications   LOTREL 5-10 MG per capsule   simvastatin (ZOCOR) 20 MG tablet   Hyperlipidemia    Stable with simvastatin and diet  Disc goals for lipids and reasons to control them Rev labs with pt Rev low sat fat diet in detail        Relevant Medications   LOTREL 5-10 MG per capsule   simvastatin (ZOCOR) 20 MG tablet   Need for hepatitis C screening test    Lab for screening today  Not high risk       Relevant Orders   Hepatitis C antibody (Completed)   Obesity    Discussed how this problem influences overall health and the risks it imposes  Reviewed plan for weight loss with lower calorie diet (via better food choices and also portion control or program like weight watchers) and exercise building up to or more than 30 minutes 5 days per week including some aerobic activity   Needs to cut calories further  Enc more exercise  Health is at risk due to weight       Prostate cancer screening    Lab Results  Component Value Date   PSA 1.39 01/23/2015   PSA 0.92 11/02/2013    Pt sees urology who examines prostate and also tx hypogonadism  No symptoms / doing well       Routine general medical examination at a health care facility    Reviewed health habits including diet and exercise and skin cancer prevention Reviewed appropriate screening tests for age  Also reviewed health mt list, fam hx and immunization status , as well as social and family history   See HPI Labs reviewed  HIV and Hep C screening today  Enc diet/exercise for wt loss and better health  utd colonoscopy        Screening for HIV (human immunodeficiency virus)    Lab for screening today  Not high risk by hx       Relevant Orders   HIV antibody (with reflex) (Completed)

## 2015-01-26 NOTE — Progress Notes (Signed)
Pre visit review using our clinic review tool, if applicable. No additional management support is needed unless otherwise documented below in the visit note. 

## 2015-01-27 LAB — HIV ANTIBODY (ROUTINE TESTING W REFLEX): HIV 1&2 Ab, 4th Generation: NONREACTIVE

## 2015-01-27 LAB — HEPATITIS C ANTIBODY: HCV Ab: NEGATIVE

## 2015-01-29 NOTE — Assessment & Plan Note (Signed)
Stable with simvastatin and diet  Disc goals for lipids and reasons to control them Rev labs with pt Rev low sat fat diet in detail

## 2015-01-29 NOTE — Assessment & Plan Note (Signed)
Reviewed health habits including diet and exercise and skin cancer prevention Reviewed appropriate screening tests for age  Also reviewed health mt list, fam hx and immunization status , as well as social and family history   See HPI Labs reviewed  HIV and Hep C screening today  Enc diet/exercise for wt loss and better health  utd colonoscopy

## 2015-01-29 NOTE — Assessment & Plan Note (Signed)
Lab Results  Component Value Date   PSA 1.39 01/23/2015   PSA 0.92 11/02/2013    Pt sees urology who examines prostate and also tx hypogonadism  No symptoms / doing well

## 2015-01-29 NOTE — Assessment & Plan Note (Signed)
Lab for screening today  Not high risk by hx

## 2015-01-29 NOTE — Assessment & Plan Note (Signed)
bp in fair control at this time  BP Readings from Last 1 Encounters:  01/26/15 122/68   No changes needed Disc lifstyle change with low sodium diet and exercise  Labs reviewed

## 2015-01-29 NOTE — Assessment & Plan Note (Signed)
Discussed how this problem influences overall health and the risks it imposes  Reviewed plan for weight loss with lower calorie diet (via better food choices and also portion control or program like weight watchers) and exercise building up to or more than 30 minutes 5 days per week including some aerobic activity   Needs to cut calories further  Enc more exercise  Health is at risk due to weight

## 2015-01-29 NOTE — Assessment & Plan Note (Signed)
Lab for screening today  Not high risk

## 2015-04-19 IMAGING — US US RENAL
1 series · 14 of 25 positions shown · non-contrast
Comparison: CT abdomen and pelvis January 09, 2009

CLINICAL DATA: Left flank pain

EXAM:
RENAL/URINARY TRACT ULTRASOUND COMPLETE

[Series 1: us renal · 0.25mm/px · 14 of 40 slices shown]
[im 1/40]
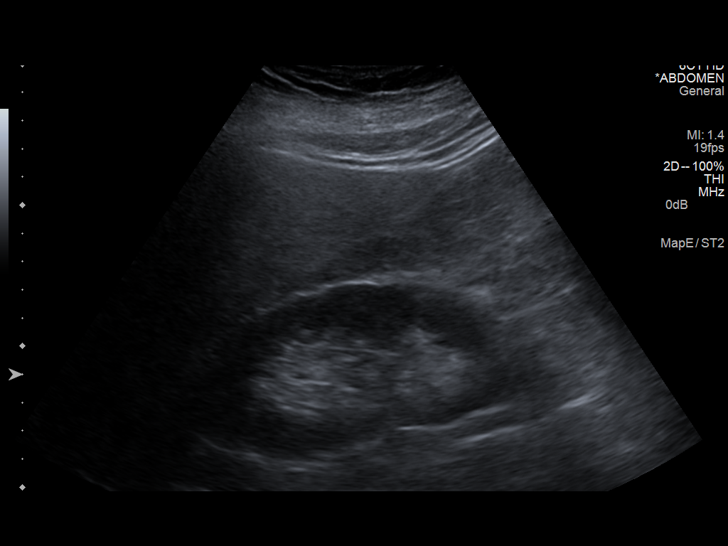
[im 4/40]
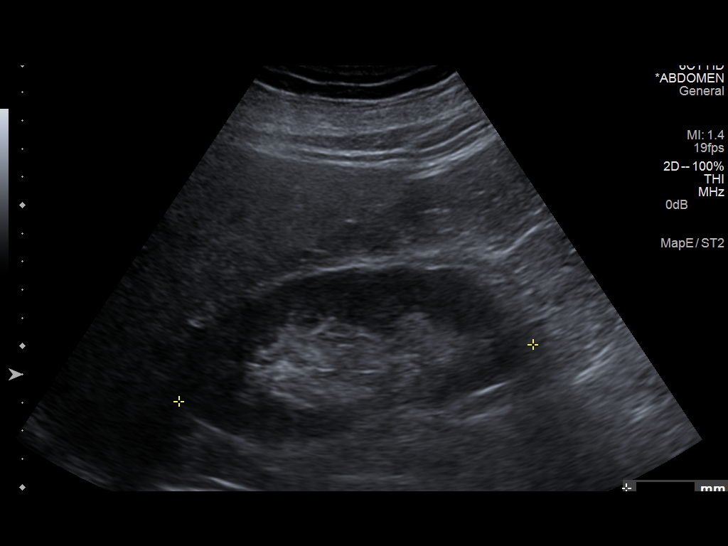
[im 7/40]
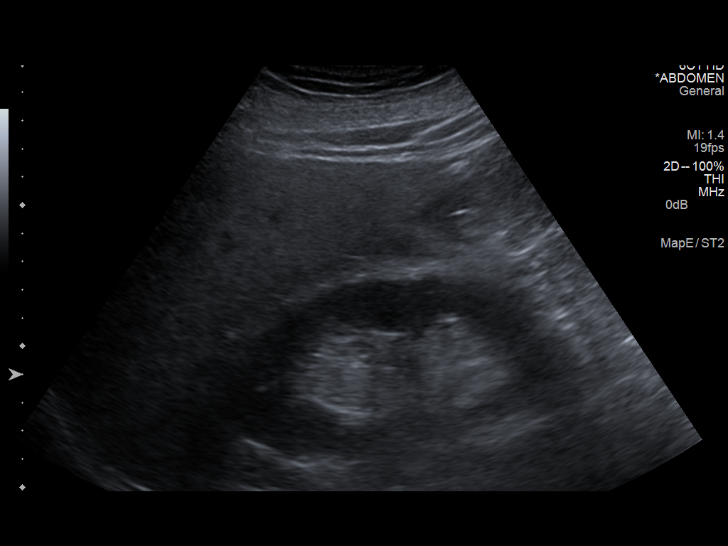
[im 10/40]
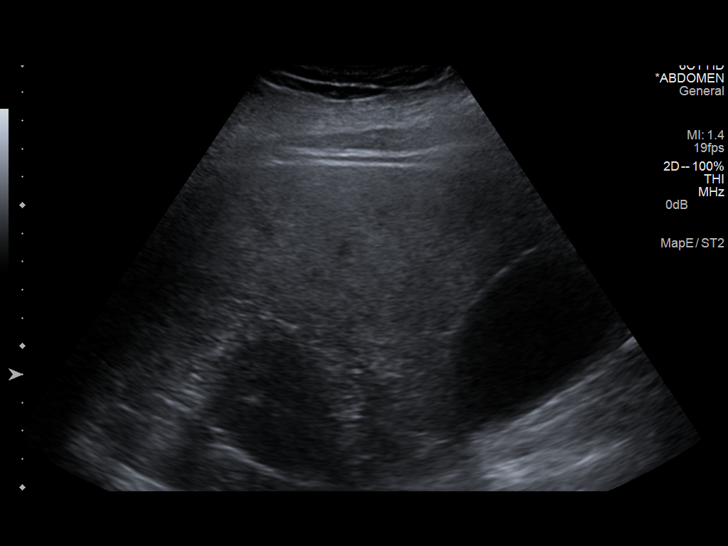
[im 14/40]
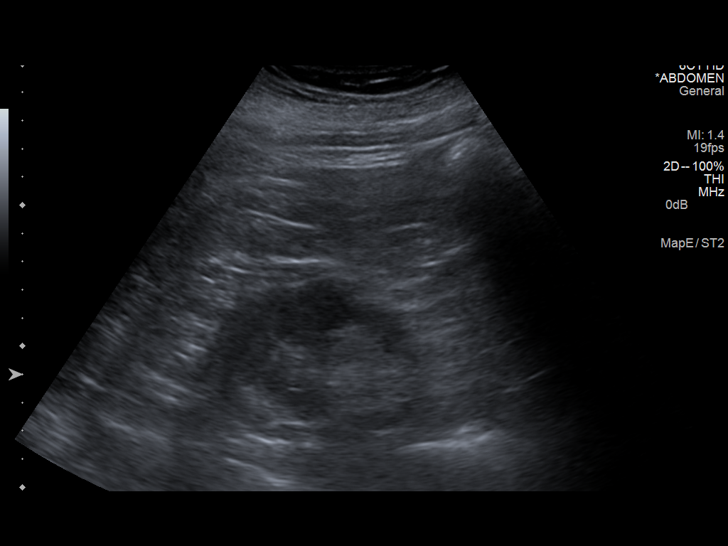
[im 15/40]
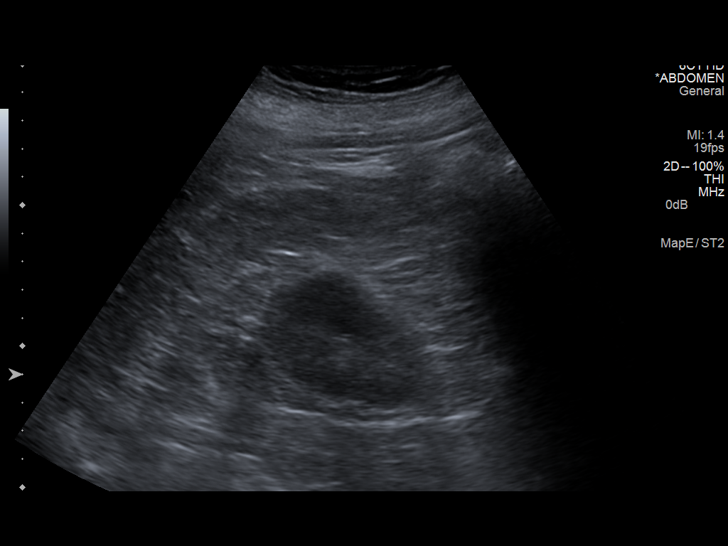
[im 18/40]
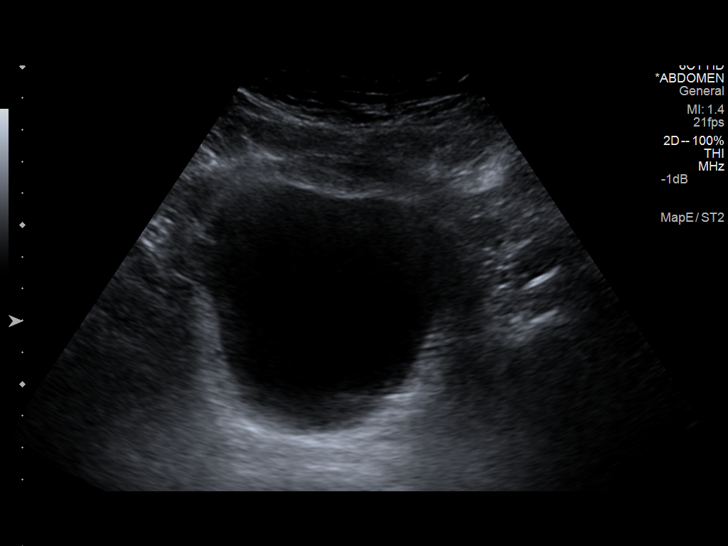
[im 22/40]
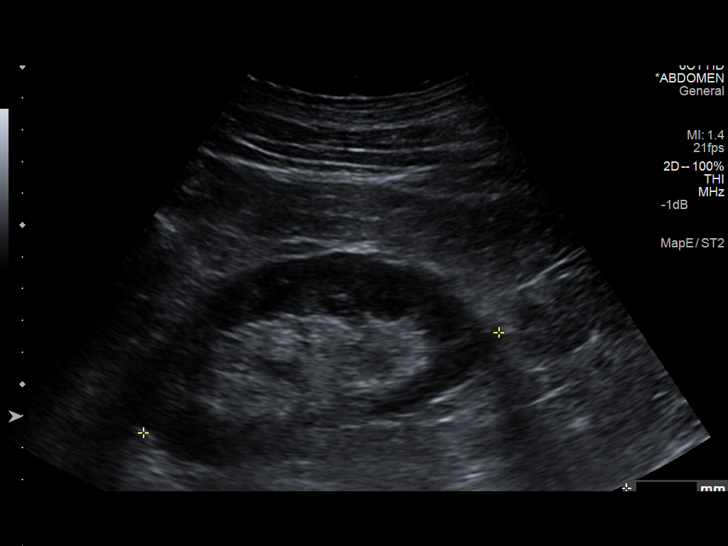
[im 25/40]
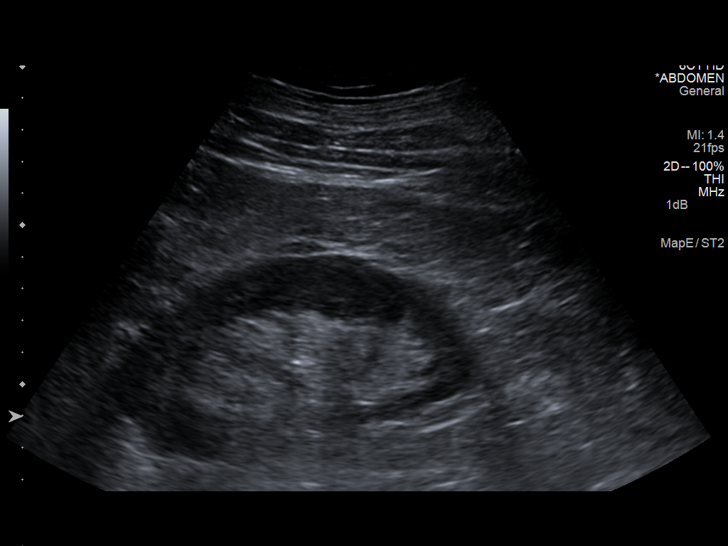
[im 27/40]
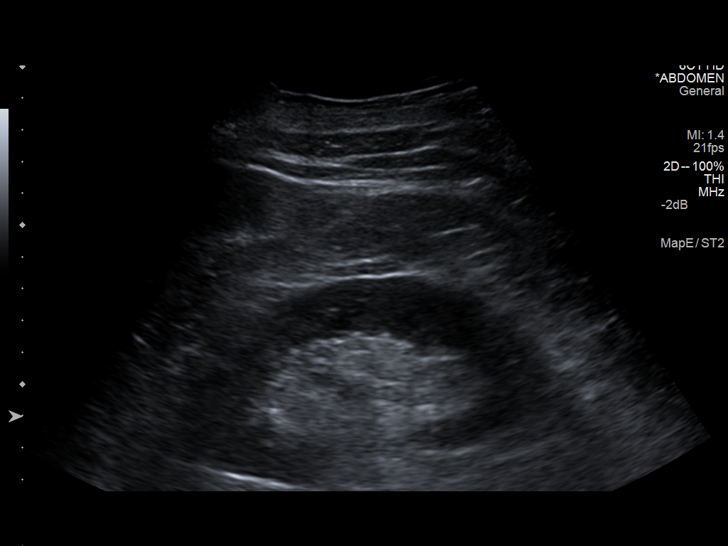
[im 30/40]
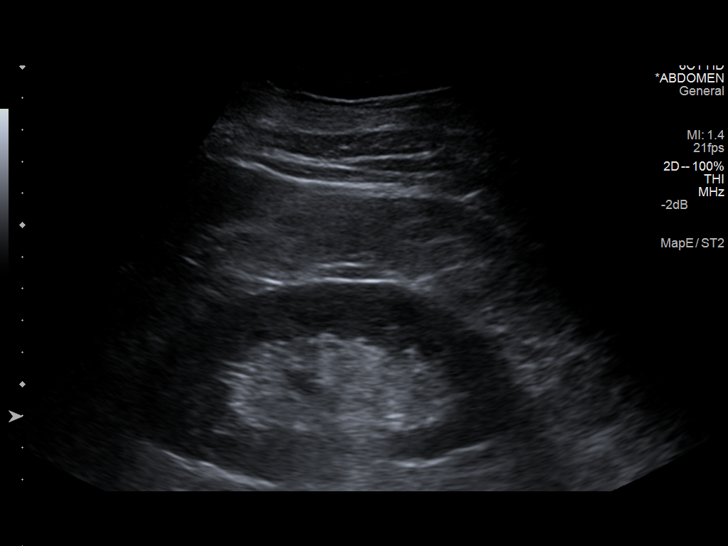
[im 33/40]
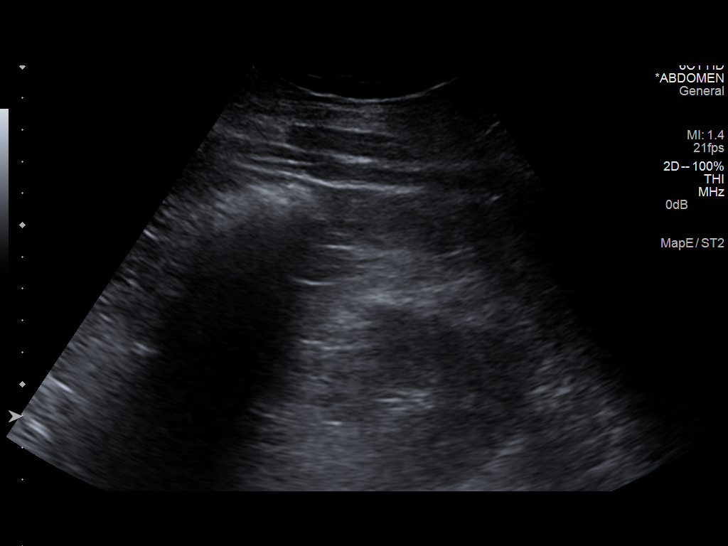
[im 36/40]
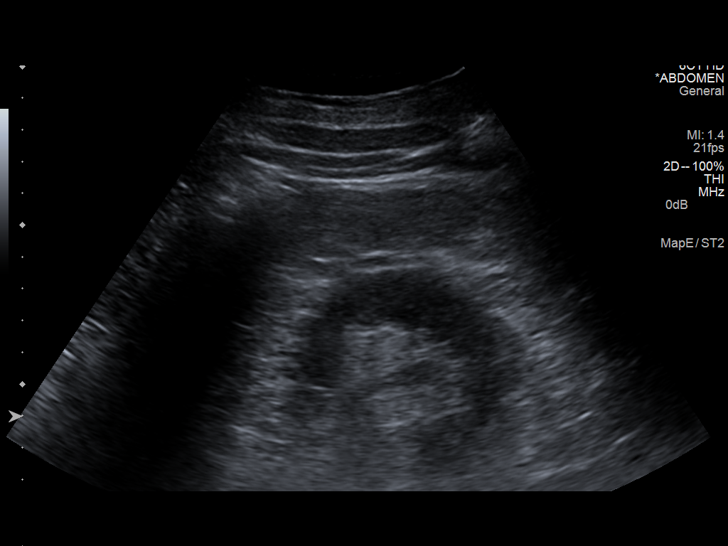
[im 40/40]
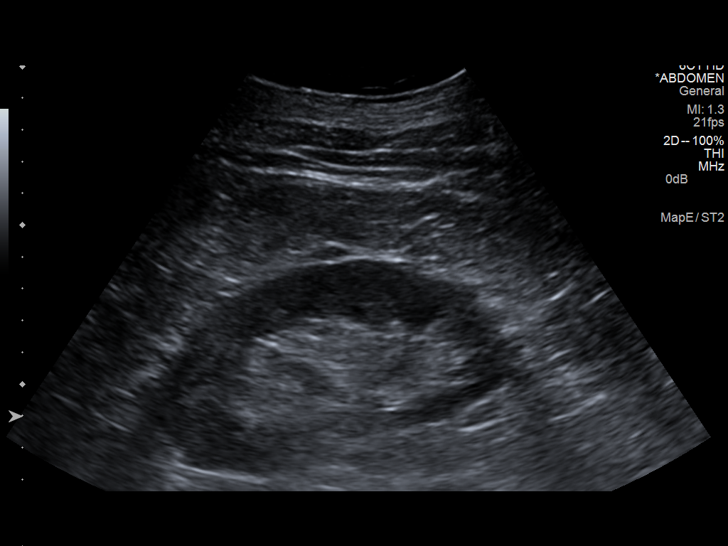

[14 of 25 positions shown; findings below may reference images not displayed]

FINDINGS: Right Kidney:

Length: 12.7 cm. Echogenicity and renal cortical thickness are
within normal limits. No mass, perinephric fluid, or
pelvicaliectasis visualized. No sonographically demonstrable
calculus or ureterectasis.

Left Kidney:

Length: 11.6 cm. Echogenicity and renal cortical thickness are
within normal limits. No mass or perinephric fluid visualized. There
is minimal fullness of the left renal collecting system. No
sonographically demonstrable calculus or ureterectasis seen.

Bladder:

Appears normal for degree of bladder distention.
IMPRESSION: Minimal fullness of the left renal collecting system without
obstructing focus appreciable. Study otherwise unremarkable.

## 2015-08-24 ENCOUNTER — Ambulatory Visit (INDEPENDENT_AMBULATORY_CARE_PROVIDER_SITE_OTHER): Payer: Managed Care, Other (non HMO) | Admitting: Family Medicine

## 2015-08-24 ENCOUNTER — Encounter: Payer: Self-pay | Admitting: Family Medicine

## 2015-08-24 VITALS — BP 110/70 | HR 67 | Temp 97.9°F | Ht 70.0 in | Wt 279.2 lb

## 2015-08-24 DIAGNOSIS — J302 Other seasonal allergic rhinitis: Secondary | ICD-10-CM | POA: Diagnosis not present

## 2015-08-24 DIAGNOSIS — L309 Dermatitis, unspecified: Secondary | ICD-10-CM

## 2015-08-24 MED ORDER — AZELASTINE HCL 0.15 % NA SOLN: 1.0000 | Freq: Two times a day (BID) | NASAL | Status: AC

## 2015-08-24 MED ORDER — FLUTICASONE PROPIONATE 50 MCG/ACT NA SUSP: 2.0000 | Freq: Every day | NASAL | Status: DC | PRN

## 2015-08-24 MED ORDER — TRIAMCINOLONE ACETONIDE 0.5 % EX CREA: 1.0000 "application " | TOPICAL_CREAM | Freq: Two times a day (BID) | CUTANEOUS | Status: DC

## 2015-08-24 NOTE — Assessment & Plan Note (Signed)
Refilled astelin and flonase ns today  Urged to avoid allergens

## 2015-08-24 NOTE — Patient Instructions (Signed)
I think you have dermatitis (eczema type) on your leg  Keep clean with gentle soap and water - dove soap is best  Use triamcinolone cream twice daily  Keep well moisturized  Avoid hot water  No band aids   Update if not starting to improve in a week or if worsening

## 2015-08-24 NOTE — Assessment & Plan Note (Signed)
Rash on L upper leg- resembles atopic dermatitis  Disc use of gentle soap and water- avoid fragrances and hot water  Triamcinolone cream 0.5 % bid until improved Moisturizer prn  Update if not starting to improve in a week or if worsening

## 2015-08-24 NOTE — Progress Notes (Signed)
Pre visit review using our clinic review tool, if applicable. No additional management support is needed unless otherwise documented below in the visit note. 

## 2015-08-24 NOTE — Progress Notes (Signed)
Subjective:    Patient ID: Jeffrey Hartman, male    DOB: Nov 15, 1960, 54 y.o.   MRN: [id number]  HPI Here for a rash on L thigh   Not itchy  Wonders if it is ringworm  Had spots on both arms - resp to lotrimin   Lotion Blue star ointment  Lotrimin  No help   Is allergic to band aids - put a band aid on it and it got worse     Patient Active Problem List   Diagnosis Date Noted  . Eczema 08/24/2015  . Need for hepatitis C screening test 01/26/2015  . Screening for HIV (human immunodeficiency virus) 01/26/2015  . Low back pain with left-sided sciatica 06/16/2014  . Cerumen impaction 06/16/2014  . Left foot pain 11/09/2013  . Low testosterone 11/09/2013  . Prostate cancer screening 11/02/2013  . Adenomatous colon polyp 07/28/2012  . Family history of malignant neoplasm of gastrointestinal tract 06/29/2012  . Anal fissure 06/29/2012  . Personal history of colonic polyps 06/29/2012  . Routine general medical examination at a health care facility 09/17/2011  . Obesity 01/01/2010  . ALLERGIC RHINITIS 10/13/2008  . Obstructive sleep apnea 08/11/2007  . OBSESSIVE-COMPULSIVE DISORDER 01/21/2007  . ACTINIC SKIN DAMAGE 01/21/2007  . ONYCHOMYCOSIS 01/13/2007  . Hyperlipidemia 01/13/2007  . Essential hypertension 01/13/2007   Past Medical History  Diagnosis Date  . Obstructive sleep apnea (adult) (pediatric)   . Unspecified dermatitis due to sun   . Obsessive-compulsive disorders   . Anxiety states   . Unspecified sleep apnea   . Dermatophytosis of nail   . Unspecified essential hypertension   . Other and unspecified hyperlipidemia   . Seasonal allergic rhinitis    Past Surgical History  Procedure Laterality Date  . Cystectomy      cyst on hand  . Colonoscopy  11/02, 11/05    polyps   Social History  Substance Use Topics  . Smoking status: Never Smoker   . Smokeless tobacco: Never Used  . Alcohol Use: 0.0 oz/week    0 Standard drinks or equivalent per week   Comment: occasionally,1 beer once month   Family History  Problem Relation Age of Onset  . Hypertension Father   . Diabetes type II Father   . Cancer - Other Father 84    Adrenal Cancer  . Colon cancer Mother 45  . Prostate cancer Neg Hx    Allergies  Allergen Reactions  . Shellfish Allergy Shortness Of Breath and Other (See Comments)    Reaction=tunnel vision  . Amlodipine Besy-Benazepril Hcl     REACTION: Generic med did not adequatly control htnh  . Sertraline Hcl Nausea Only    REACTION: nausea, ED   Current Outpatient Prescriptions on File Prior to Visit  Medication Sig Dispense Refill  . aspirin 81 MG tablet Take 81 mg by mouth daily.      . clomiPHENE (CLOMID) 50 MG tablet Take 50 mg by mouth at bedtime.    Marland Kitchen escitalopram (LEXAPRO) 20 MG tablet Take 1 tablet (20 mg total) by mouth daily. 90 tablet 3  . Fexofenadine HCl (ALLEGRA PO) Take 1 tablet by mouth daily. As directed    . fish oil-omega-3 fatty acids 1000 MG capsule Take 1 g by mouth daily.     . hydrocortisone (ANUSOL-HC) 25 MG suppository Place 1 suppository (25 mg total) rectally every evening. As needed for 7 days as needed for rectal pain or bleeding 14 suppository 2  . LOTREL 5-10 MG  per capsule Take 1 capsule by mouth daily. 90 capsule 3  . Melatonin 5 MG TABS Take 10 mg by mouth at bedtime.    . simvastatin (ZOCOR) 20 MG tablet Take 1 tablet (20 mg total) by mouth daily. 90 tablet 3   No current facility-administered medications on file prior to visit.    Review of Systems    Review of Systems  Constitutional: Negative for fever, appetite change, fatigue and unexpected weight change.  Eyes: Negative for pain and visual disturbance.  Respiratory: Negative for cough and shortness of breath.   Cardiovascular: Negative for cp or palpitations    Gastrointestinal: Negative for nausea, diarrhea and constipation.  Genitourinary: Negative for urgency and frequency.  Skin: Negative for pallor and pos for rash    Neurological: Negative for weakness, light-headedness, numbness and headaches.  Hematological: Negative for adenopathy. Does not bruise/bleed easily.  Psychiatric/Behavioral: Negative for dysphoric mood. The patient is not nervous/anxious.      Objective:   Physical Exam  Constitutional: He appears well-developed and well-nourished. No distress.  obese and well appearing   HENT:  Head: Normocephalic and atraumatic.  Eyes: Conjunctivae and EOM are normal. Pupils are equal, round, and reactive to light. Right eye exhibits no discharge.  Neck: Normal range of motion. Neck supple.  Cardiovascular: Normal rate and regular rhythm.   Pulmonary/Chest: Effort normal and breath sounds normal. No respiratory distress. He has no wheezes. He has no rales.  Musculoskeletal: Normal range of motion. He exhibits no edema.  Lymphadenopathy:    He has no cervical adenopathy.  Neurological: He is alert.  Skin:  Several 2-3 cm areas of scale and pink skin (without plaque) on L upper leg No excoriation No other skin findings           Assessment & Plan:   Problem List Items Addressed This Visit      Musculoskeletal and Integument   Eczema - Primary    Rash on L upper leg- resembles atopic dermatitis  Disc use of gentle soap and water- avoid fragrances and hot water  Triamcinolone cream 0.5 % bid until improved Moisturizer prn  Update if not starting to improve in a week or if worsening

## 2015-09-26 ENCOUNTER — Other Ambulatory Visit: Payer: Self-pay | Admitting: Family Medicine

## 2015-10-14 ENCOUNTER — Other Ambulatory Visit: Payer: Self-pay | Admitting: Family Medicine

## 2015-10-15 ENCOUNTER — Telehealth: Payer: Self-pay | Admitting: Pulmonary Disease

## 2015-10-15 ENCOUNTER — Telehealth: Payer: Self-pay

## 2015-10-15 NOTE — Telephone Encounter (Signed)
He can see TP this month- next available appt OK to give letter

## 2015-10-15 NOTE — Telephone Encounter (Signed)
Pt left v/m requesting copy of updated med list and immunization list; pt is traveling to [place] and [place] in May 2017. Pt will ck with health dept travel advisory dept to see if needs any immunizations. Pt will pick up at front desk

## 2015-10-15 NOTE — Telephone Encounter (Signed)
Called spoke with pt. He is former [facility] pt and is scheduled to see RA on 01/14/16. He is going out of the country in May and is requiring an RX for his CPAP machine to take this. He would like this mailed to him. Please advise RA if you are willing to sign this? thanks

## 2015-10-16 NOTE — Telephone Encounter (Signed)
lmtcb x1 for pt. 

## 2015-10-17 NOTE — Telephone Encounter (Signed)
lmtcb x2 for pt. 

## 2015-10-18 NOTE — Telephone Encounter (Signed)
lmtcb x1 for pt. 

## 2015-10-18 NOTE — Telephone Encounter (Signed)
LMTCB x3 for pt.  

## 2015-10-18 NOTE — Telephone Encounter (Signed)
Pt returning call.Jeffrey Hartman ° °

## 2015-10-19 ENCOUNTER — Ambulatory Visit: Payer: Managed Care, Other (non HMO) | Admitting: Pulmonary Disease

## 2015-10-19 NOTE — Telephone Encounter (Signed)
lmtcb X2 for pt.  

## 2015-10-22 NOTE — Telephone Encounter (Signed)
LMTCB x 3 

## 2015-10-23 NOTE — Telephone Encounter (Signed)
lmtcb X4 for pt.  

## 2015-10-24 ENCOUNTER — Telehealth: Payer: Self-pay | Admitting: Pulmonary Disease

## 2015-10-24 NOTE — Telephone Encounter (Signed)
Per 10/15/15 phone note: Rigoberto Noel, MD at 10/15/2015 7:05 PM     Status: Signed       Expand All Collapse All   He can see TP this month- next available appt OK to give letter      ---   Called spoke with pt. RA had an opening on 2/27 at 2pm. appt scheduled. nothing further needed

## 2015-10-24 NOTE — Telephone Encounter (Signed)
lmtcb x5. Per triage protcol, this message will be closed.

## 2015-10-29 ENCOUNTER — Encounter: Payer: Self-pay | Admitting: Pulmonary Disease

## 2015-10-29 ENCOUNTER — Ambulatory Visit (INDEPENDENT_AMBULATORY_CARE_PROVIDER_SITE_OTHER): Payer: Managed Care, Other (non HMO) | Admitting: Pulmonary Disease

## 2015-10-29 ENCOUNTER — Ambulatory Visit: Payer: Managed Care, Other (non HMO) | Admitting: Pulmonary Disease

## 2015-10-29 VITALS — BP 142/88 | HR 65 | Ht 71.0 in | Wt 278.0 lb

## 2015-10-29 DIAGNOSIS — I10 Essential (primary) hypertension: Secondary | ICD-10-CM | POA: Diagnosis not present

## 2015-10-29 DIAGNOSIS — G4733 Obstructive sleep apnea (adult) (pediatric): Secondary | ICD-10-CM | POA: Diagnosis not present

## 2015-10-29 NOTE — Patient Instructions (Signed)
Letter for travel CPAP supplies will be renewed x 1 year

## 2015-10-29 NOTE — Progress Notes (Signed)
   Subjective:    Patient ID: Jeffrey Hartman, male    DOB: [birth year], 55 y.o.   MRN: [id number]  HPI  Chief Complaint  Patient presents with  . Follow-up    Patient did not bring SD card, says SD card missing; sleeps well on CPAP; no concerns.   [place] pt - annual FU BP slight high DME - [facility] Had current CPAP x 4 y  Planning to travel to [place], [place] Nasal pillows ok, pr ok, no dryness - uses vaseline Wt up 10 lbs - used to be 300 range  NPSG 2000:  AHI 15/hr.   Past Medical History  Diagnosis Date  . Obstructive sleep apnea (adult) (pediatric)   . Unspecified dermatitis due to sun   . Obsessive-compulsive disorders   . Anxiety states   . Unspecified sleep apnea   . Dermatophytosis of nail   . Unspecified essential hypertension   . Other and unspecified hyperlipidemia   . Seasonal allergic rhinitis      Review of Systems  neg for any significant sore throat, dysphagia, itching, sneezing, nasal congestion or excess/ purulent secretions, fever, chills, sweats, unintended wt loss, pleuritic or exertional cp, hempoptysis, orthopnea pnd or change in chronic leg swelling.  Also denies presyncope, palpitations, heartburn, abdominal pain, nausea, vomiting, diarrhea or change in bowel or urinary habits, dysuria,hematuria, rash, arthralgias, visual complaints, headache, numbness weakness or ataxia.     Objective:   Physical Exam  Gen. Pleasant, obese, in no distress ENT - no lesions, no post nasal drip Neck: No JVD, no thyromegaly, no carotid bruits Lungs: no use of accessory muscles, no dullness to percussion, decreased without rales or rhonchi  Cardiovascular: Rhythm regular, heart sounds  normal, no murmurs or gallops, no peripheral edema Musculoskeletal: No deformities, no cyanosis or clubbing , no tremors        Assessment & Plan:

## 2015-10-29 NOTE — Addendum Note (Signed)
Addended by: Mathis Dad on: 10/29/2015 04:47 PM   Modules accepted: Orders

## 2015-10-29 NOTE — Assessment & Plan Note (Signed)
BP high today FU with PCP

## 2015-10-29 NOTE — Assessment & Plan Note (Signed)
Letter for travel CPAP supplies will be renewed x 1 year  Weight loss encouraged, compliance with goal of at least 4-6 hrs every night is the expectation. Advised against medications with sedative side effects Cautioned against driving when sleepy - understanding that sleepiness will vary on a day to day basis

## 2016-01-14 ENCOUNTER — Ambulatory Visit: Payer: Managed Care, Other (non HMO) | Admitting: Pulmonary Disease

## 2016-01-20 ENCOUNTER — Telehealth (INDEPENDENT_AMBULATORY_CARE_PROVIDER_SITE_OTHER): Payer: Managed Care, Other (non HMO) | Admitting: Family Medicine

## 2016-01-20 DIAGNOSIS — Z125 Encounter for screening for malignant neoplasm of prostate: Secondary | ICD-10-CM

## 2016-01-20 DIAGNOSIS — Z0000 Encounter for general adult medical examination without abnormal findings: Secondary | ICD-10-CM | POA: Diagnosis not present

## 2016-01-20 NOTE — Telephone Encounter (Signed)
-----   Message from Ellamae Sia sent at 01/18/2016 11:25 AM EDT ----- Regarding: Lab orders for Tuesday, 5.30.17 Patient is scheduled for CPX labs, please order future labs, Thanks , Karna Christmas

## 2016-01-23 ENCOUNTER — Encounter: Payer: Self-pay | Admitting: Pulmonary Disease

## 2016-01-29 ENCOUNTER — Other Ambulatory Visit: Payer: Managed Care, Other (non HMO)

## 2016-01-30 ENCOUNTER — Other Ambulatory Visit: Payer: Managed Care, Other (non HMO)

## 2016-01-31 LAB — CBC WITH DIFFERENTIAL/PLATELET
BASOS PCT: 0.6 % (ref 0.0–3.0)
Basophils Absolute: 0.1 10*3/uL (ref 0.0–0.1)
EOS ABS: 0.1 10*3/uL (ref 0.0–0.7)
EOS PCT: 1.7 % (ref 0.0–5.0)
HEMATOCRIT: 43.1 % (ref 39.0–52.0)
HEMOGLOBIN: 14.4 g/dL (ref 13.0–17.0)
LYMPHS PCT: 19.6 % (ref 12.0–46.0)
Lymphs Abs: 1.7 10*3/uL (ref 0.7–4.0)
MCHC: 33.5 g/dL (ref 30.0–36.0)
MCV: 87.5 fl (ref 78.0–100.0)
Monocytes Absolute: 0.6 10*3/uL (ref 0.1–1.0)
Monocytes Relative: 7 % (ref 3.0–12.0)
NEUTROS ABS: 6.1 10*3/uL (ref 1.4–7.7)
Neutrophils Relative %: 71.1 % (ref 43.0–77.0)
PLATELETS: 238 10*3/uL (ref 150.0–400.0)
RBC: 4.92 Mil/uL (ref 4.22–5.81)
RDW: 13.9 % (ref 11.5–15.5)
WBC: 8.6 10*3/uL (ref 4.0–10.5)

## 2016-01-31 LAB — COMPREHENSIVE METABOLIC PANEL
ALK PHOS: 36 U/L — AB (ref 39–117)
ALT: 21 U/L (ref 0–53)
AST: 20 U/L (ref 0–37)
Albumin: 4.5 g/dL (ref 3.5–5.2)
BILIRUBIN TOTAL: 0.5 mg/dL (ref 0.2–1.2)
BUN: 15 mg/dL (ref 6–23)
CO2: 28 mEq/L (ref 19–32)
Calcium: 9.4 mg/dL (ref 8.4–10.5)
Chloride: 102 mEq/L (ref 96–112)
Creatinine, Ser: 1.06 mg/dL (ref 0.40–1.50)
GFR: 77.66 mL/min (ref >60.00)
GLUCOSE: 78 mg/dL (ref 70–99)
Potassium: 4.1 mEq/L (ref 3.5–5.1)
Sodium: 138 mEq/L (ref 135–145)
TOTAL PROTEIN: 7 g/dL (ref 6.0–8.3)

## 2016-01-31 LAB — LIPID PANEL
Cholesterol: 171 mg/dL (ref 0–200)
HDL: 36.5 mg/dL — ABNORMAL LOW (ref >39.00)
LDL Cholesterol: 107 mg/dL — ABNORMAL HIGH (ref 0–99)
NONHDL: 134.04
Total CHOL/HDL Ratio: 5
Triglycerides: 136 mg/dL (ref 0.0–149.0)
VLDL: 27.2 mg/dL (ref 0.0–40.0)

## 2016-01-31 LAB — PSA: PSA: 0.89 ng/mL (ref 0.10–4.00)

## 2016-01-31 LAB — TSH: TSH: 1.15 u[IU]/mL (ref 0.35–4.50)

## 2016-01-31 NOTE — Addendum Note (Signed)
Addended by: Royann Shivers A on: 01/31/2016 09:47 AM   Modules accepted: Orders

## 2016-02-01 ENCOUNTER — Encounter: Payer: Managed Care, Other (non HMO) | Admitting: Family Medicine

## 2016-03-10 ENCOUNTER — Ambulatory Visit (INDEPENDENT_AMBULATORY_CARE_PROVIDER_SITE_OTHER): Payer: Managed Care, Other (non HMO) | Admitting: Family Medicine

## 2016-03-10 ENCOUNTER — Encounter: Payer: Self-pay | Admitting: Family Medicine

## 2016-03-10 VITALS — BP 118/66 | HR 72 | Temp 98.8°F | Ht 70.0 in | Wt 275.5 lb

## 2016-03-10 DIAGNOSIS — E785 Hyperlipidemia, unspecified: Secondary | ICD-10-CM

## 2016-03-10 DIAGNOSIS — E669 Obesity, unspecified: Secondary | ICD-10-CM

## 2016-03-10 DIAGNOSIS — F429 Obsessive-compulsive disorder, unspecified: Secondary | ICD-10-CM

## 2016-03-10 DIAGNOSIS — Z125 Encounter for screening for malignant neoplasm of prostate: Secondary | ICD-10-CM

## 2016-03-10 DIAGNOSIS — Z0000 Encounter for general adult medical examination without abnormal findings: Secondary | ICD-10-CM | POA: Diagnosis not present

## 2016-03-10 DIAGNOSIS — I10 Essential (primary) hypertension: Secondary | ICD-10-CM | POA: Diagnosis not present

## 2016-03-10 DIAGNOSIS — R7989 Other specified abnormal findings of blood chemistry: Secondary | ICD-10-CM

## 2016-03-10 DIAGNOSIS — E291 Testicular hypofunction: Secondary | ICD-10-CM

## 2016-03-10 NOTE — Patient Instructions (Signed)
We will do your prior auth for lotrel when it comes Take care of yourself  Keep working on weight loss - with diet and exercise  Use your fitness tracker and log in calories   Labs are stable

## 2016-03-10 NOTE — Assessment & Plan Note (Signed)
Doing very well on lexapro (has failed other agents) and tolerates it well  Will continue current dose

## 2016-03-10 NOTE — Assessment & Plan Note (Signed)
HDL and LDL are both up a bit  Disc goals for lipids and reasons to control them Rev labs with pt Rev low sat fat diet in detail Continue simvastatin

## 2016-03-10 NOTE — Assessment & Plan Note (Signed)
Caucasian male with no family hx and no symptoms Lab Results  Component Value Date   PSA 0.89 01/31/2016   PSA 1.39 01/23/2015   PSA 0.92 11/02/2013   He sees urology for regular visit in the fall

## 2016-03-10 NOTE — Progress Notes (Signed)
Pre visit review using our clinic review tool, if applicable. No additional management support is needed unless otherwise documented below in the visit note. 

## 2016-03-10 NOTE — Progress Notes (Signed)
Subjective:    Patient ID: Jeffrey Hartman, male    DOB: 1961-06-06, 55 y.o.   MRN: [id number]  HPI Here for health maintenance exam and to review chronic medical problems   Doing well  25th wedding Maryruth Hancock - went to [place] in May for 15 days  [place] and [place] is down 3 lb with bmi of 39.5- obese/almost morbid obesity  Is trying to take better care of himself  Gained some from his trip to begin with  Exercising- walking regularly  Diet - is fair - good and bad days   Colonoscopy 11/13 with 5 year recall due to adenomatous polyp  Mother also had colon cancer   Father had adrenal cancer   Flu shot 10/16  Td 4/14  Hep C and HIV screen done 5/16 neg   bp is stable today  No cp or palpitations or headaches or edema  No side effects to medicines  BP Readings from Last 3 Encounters:  03/10/16 118/66  10/29/15 142/88  08/24/15 110/70     bp was higher when he saw Dr Elsworth Soho in Feb Still takes the brand name of lotrel- cannot tol the generic or the medicines separated  Will get prior auth in Aug   Being treated for sleep apnea  Loves cpap - does it and he feels so much better , sleeps better and better energy   Hx of hyperlipidemia Lab Results  Component Value Date   CHOL 171 01/31/2016   CHOL 159 01/23/2015   CHOL 159 11/02/2013   Lab Results  Component Value Date   HDL 36.50* 01/31/2016   HDL 31.70* 01/23/2015   HDL 32.90* 11/02/2013   Lab Results  Component Value Date   LDLCALC 107* 01/31/2016   LDLCALC 99 01/23/2015   LDLCALC 94 11/02/2013   Lab Results  Component Value Date   TRIG 136.0 01/31/2016   TRIG 142.0 01/23/2015   TRIG 163.0* 11/02/2013   Lab Results  Component Value Date   CHOLHDL 5 01/31/2016   CHOLHDL 5 01/23/2015   CHOLHDL 5 11/02/2013   Lab Results  Component Value Date   LDLDIRECT 109.0 09/21/2009   on simvastatin and diet  Fairly stable Seldom misses a dose of statin   Hx of OCD Mood -pretty ogod  On lexapro -doing  fine with it   Prostate cancer screen No prostate issues or problems  Does see a urologist (kidney stones)  Lab Results  Component Value Date   PSA 0.89 01/31/2016   PSA 1.39 01/23/2015   PSA 0.92 11/02/2013    Taking osteo bio flex for joints - helps quite a bit   Still takes clomid Sees urology in Sept   Results for orders placed or performed in visit on 01/20/16  CBC with Differential/Platelet  Result Value Ref Range   WBC 8.6 4.0 - 10.5 K/uL   RBC 4.92 4.22 - 5.81 Mil/uL   Hemoglobin 14.4 13.0 - 17.0 g/dL   HCT 43.1 39.0 - 52.0 %   MCV 87.5 78.0 - 100.0 fl   MCHC 33.5 30.0 - 36.0 g/dL   RDW 13.9 11.5 - 15.5 %   Platelets 238.0 150.0 - 400.0 K/uL   Neutrophils Relative % 71.1 43.0 - 77.0 %   Lymphocytes Relative 19.6 12.0 - 46.0 %   Monocytes Relative 7.0 3.0 - 12.0 %   Eosinophils Relative 1.7 0.0 - 5.0 %   Basophils Relative 0.6 0.0 - 3.0 %   Neutro Abs  6.1 1.4 - 7.7 K/uL   Lymphs Abs 1.7 0.7 - 4.0 K/uL   Monocytes Absolute 0.6 0.1 - 1.0 K/uL   Eosinophils Absolute 0.1 0.0 - 0.7 K/uL   Basophils Absolute 0.1 0.0 - 0.1 K/uL  Comprehensive metabolic panel  Result Value Ref Range   Sodium 138 135 - 145 mEq/L   Potassium 4.1 3.5 - 5.1 mEq/L   Chloride 102 96 - 112 mEq/L   CO2 28 19 - 32 mEq/L   Glucose, Bld 78 70 - 99 mg/dL   BUN 15 6 - 23 mg/dL   Creatinine, Ser 1.06 0.40 - 1.50 mg/dL   Total Bilirubin 0.5 0.2 - 1.2 mg/dL   Alkaline Phosphatase 36 (L) 39 - 117 U/L   AST 20 0 - 37 U/L   ALT 21 0 - 53 U/L   Total Protein 7.0 6.0 - 8.3 g/dL   Albumin 4.5 3.5 - 5.2 g/dL   Calcium 9.4 8.4 - 10.5 mg/dL   GFR 77.66 >60.00 mL/min  Lipid panel  Result Value Ref Range   Cholesterol 171 0 - 200 mg/dL   Triglycerides 136.0 0.0 - 149.0 mg/dL   HDL 36.50 (L) >39.00 mg/dL   VLDL 27.2 0.0 - 40.0 mg/dL   LDL Cholesterol 107 (H) 0 - 99 mg/dL   Total CHOL/HDL Ratio 5    NonHDL 134.04   PSA  Result Value Ref Range   PSA 0.89 0.10 - 4.00 ng/mL  TSH  Result Value Ref  Range   TSH 1.15 0.35 - 4.50 uIU/mL    Patient Active Problem List   Diagnosis Date Noted  . Eczema 08/24/2015  . Need for hepatitis C screening test 01/26/2015  . Screening for HIV (human immunodeficiency virus) 01/26/2015  . Low back pain with left-sided sciatica 06/16/2014  . Cerumen impaction 06/16/2014  . Left foot pain 11/09/2013  . Low testosterone 11/09/2013  . Prostate cancer screening 11/02/2013  . Adenomatous colon polyp 07/28/2012  . Family history of malignant neoplasm of gastrointestinal tract 06/29/2012  . Anal fissure 06/29/2012  . Personal history of colonic polyps 06/29/2012  . Routine general medical examination at a health care facility 09/17/2011  . Obesity 01/01/2010  . Allergic rhinitis 10/13/2008  . Obstructive sleep apnea 08/11/2007  . Obsessive-compulsive disorder 01/21/2007  . ACTINIC SKIN DAMAGE 01/21/2007  . ONYCHOMYCOSIS 01/13/2007  . Hyperlipidemia 01/13/2007  . Essential hypertension 01/13/2007   Past Medical History  Diagnosis Date  . Obstructive sleep apnea (adult) (pediatric)   . Unspecified dermatitis due to sun   . Obsessive-compulsive disorders   . Anxiety states   . Unspecified sleep apnea   . Dermatophytosis of nail   . Unspecified essential hypertension   . Other and unspecified hyperlipidemia   . Seasonal allergic rhinitis    Past Surgical History  Procedure Laterality Date  . Cystectomy      cyst on hand  . Colonoscopy  11/02, 11/05    polyps   Social History  Substance Use Topics  . Smoking status: Never Smoker   . Smokeless tobacco: Never Used  . Alcohol Use: 0.0 oz/week    0 Standard drinks or equivalent per week     Comment: occasionally,1 beer once month   Family History  Problem Relation Age of Onset  . Hypertension Father   . Diabetes type II Father   . Cancer - Other Father 32    Adrenal Cancer  . Colon cancer Mother 65  . Prostate cancer Neg Hx  Allergies  Allergen Reactions  . Shellfish Allergy  Shortness Of Breath and Other (See Comments)    Reaction=tunnel vision  . Amlodipine Besy-Benazepril Hcl     REACTION: Generic med did not adequatly control htnh  . Sertraline Hcl Nausea Only    REACTION: nausea, ED   Current Outpatient Prescriptions on File Prior to Visit  Medication Sig Dispense Refill  . aspirin 81 MG tablet Take 81 mg by mouth daily.      . Azelastine HCl (ASTEPRO) 0.15 % SOLN Place 1 spray into both nostrils 2 (two) times daily. (Patient taking differently: Place 1 spray into both nostrils 2 (two) times daily as needed. ) 90 mL 3  . clomiPHENE (CLOMID) 50 MG tablet Take 50 mg by mouth at bedtime.    Marland Kitchen escitalopram (LEXAPRO) 20 MG tablet Take 1 tablet (20 mg total) by mouth daily. 90 tablet 3  . Fexofenadine HCl (ALLEGRA PO) Take 1 tablet by mouth daily. As directed    . fish oil-omega-3 fatty acids 1000 MG capsule Take 1 g by mouth daily.     . fluticasone (FLONASE) 50 MCG/ACT nasal spray Place 2 sprays into both nostrils daily as needed. 48 g 3  . hydrocortisone (ANUSOL-HC) 25 MG suppository Place 1 suppository (25 mg total) rectally every evening. As needed for 7 days as needed for rectal pain or bleeding 14 suppository 2  . LOTREL 5-10 MG per capsule Take 1 capsule by mouth daily. 90 capsule 3  . Melatonin 5 MG TABS Take 10 mg by mouth at bedtime.    . simvastatin (ZOCOR) 20 MG tablet Take 1 tablet (20 mg total) by mouth daily. 90 tablet 3  . triamcinolone cream (KENALOG) 0.5 % Apply 1 application topically 2 (two) times daily. To affected area/rash (Patient taking differently: Apply 1 application topically 2 (two) times daily as needed. To affected area/rash) 15 g 1   No current facility-administered medications on file prior to visit.    Review of Systems    Review of Systems  Constitutional: Negative for fever, appetite change, fatigue and unexpected weight change.  Eyes: Negative for pain and visual disturbance.  Respiratory: Negative for cough and shortness  of breath.   Cardiovascular: Negative for cp or palpitations    Gastrointestinal: Negative for nausea, diarrhea and constipation.  Genitourinary: Negative for urgency and frequency.  Skin: Negative for pallor or rash   Neurological: Negative for weakness, light-headedness, numbness and headaches.  Hematological: Negative for adenopathy. Does not bruise/bleed easily.  Psychiatric/Behavioral: Negative for dysphoric mood. The patient is not nervous/anxious.      Objective:   Physical Exam  Constitutional: He appears well-developed and well-nourished. No distress.  obese and well appearing   HENT:  Head: Normocephalic and atraumatic.  Right Ear: External ear normal.  Left Ear: External ear normal.  Nose: Nose normal.  Mouth/Throat: Oropharynx is clear and moist.  Eyes: Conjunctivae and EOM are normal. Pupils are equal, round, and reactive to light. Right eye exhibits no discharge. Left eye exhibits no discharge. No scleral icterus.  Neck: Normal range of motion. Neck supple. No JVD present. Carotid bruit is not present. No thyromegaly present.  Cardiovascular: Normal rate, regular rhythm, normal heart sounds and intact distal pulses.  Exam reveals no gallop.   Pulmonary/Chest: Effort normal and breath sounds normal. No respiratory distress. He has no wheezes. He exhibits no tenderness.  Abdominal: Soft. Bowel sounds are normal. He exhibits no distension, no abdominal bruit and no mass. There is no  tenderness.  Genitourinary:  Pt sees urology  Musculoskeletal: He exhibits no edema or tenderness.  Lymphadenopathy:    He has no cervical adenopathy.  Neurological: He is alert. He has normal reflexes. No cranial nerve deficit. He exhibits normal muscle tone. Coordination normal.  Skin: Skin is warm and dry. No rash noted. No erythema. No pallor.  Many small cherry angiomas on trunk  No rash  Psychiatric: He has a normal mood and affect.          Assessment & Plan:   Problem List  Items Addressed This Visit      Cardiovascular and Mediastinum   Essential hypertension - Primary    bp in fair control at this time  BP Readings from Last 1 Encounters:  03/10/16 118/66   No changes needed Disc lifstyle change with low sodium diet and exercise  On lotrel and intolerant of generic or separated components (and also does not work for his bp) Labs reviewed Enc wt loss and exercise          Other   Routine general medical examination at a health care facility    Reviewed health habits including diet and exercise and skin cancer prevention Reviewed appropriate screening tests for age  Also reviewed health mt list, fam hx and immunization status , as well as social and family history   See HPI Rev labs-overall stable No prostate problems or symptoms  Due for colonoscopy in 2018 Enc wt loss - the biggest threat to his health right now       Prostate cancer screening    Caucasian male with no family hx and no symptoms Lab Results  Component Value Date   PSA 0.89 01/31/2016   PSA 1.39 01/23/2015   PSA 0.92 11/02/2013   He sees urology for regular visit in the fall      Obsessive-compulsive disorder    Doing very well on lexapro (has failed other agents) and tolerates it well  Will continue current dose       Obesity    Discussed how this problem influences overall health and the risks it imposes  Reviewed plan for weight loss with lower calorie diet (via better food choices and also portion control or program like weight watchers) and exercise building up to or more than 30 minutes 5 days per week including some aerobic activity   Is borderline for morbid obesity now with HTN and sleep apnea       Low testosterone   Hyperlipidemia    HDL and LDL are both up a bit  Disc goals for lipids and reasons to control them Rev labs with pt Rev low sat fat diet in detail Continue simvastatin

## 2016-03-10 NOTE — Assessment & Plan Note (Signed)
Discussed how this problem influences overall health and the risks it imposes  Reviewed plan for weight loss with lower calorie diet (via better food choices and also portion control or program like weight watchers) and exercise building up to or more than 30 minutes 5 days per week including some aerobic activity   Is borderline for morbid obesity now with HTN and sleep apnea

## 2016-03-10 NOTE — Assessment & Plan Note (Signed)
Reviewed health habits including diet and exercise and skin cancer prevention Reviewed appropriate screening tests for age  Also reviewed health mt list, fam hx and immunization status , as well as social and family history   See HPI Rev labs-overall stable No prostate problems or symptoms  Due for colonoscopy in 2018 Enc wt loss - the biggest threat to his health right now

## 2016-03-10 NOTE — Assessment & Plan Note (Signed)
bp in fair control at this time  BP Readings from Last 1 Encounters:  03/10/16 118/66   No changes needed Disc lifstyle change with low sodium diet and exercise  On lotrel and intolerant of generic or separated components (and also does not work for his bp) Labs reviewed Enc wt loss and exercise

## 2016-03-15 ENCOUNTER — Other Ambulatory Visit: Payer: Self-pay | Admitting: Family Medicine

## 2016-03-16 ENCOUNTER — Other Ambulatory Visit: Payer: Self-pay | Admitting: Family Medicine

## 2016-03-17 NOTE — Telephone Encounter (Signed)
Please refill for a year  

## 2016-03-17 NOTE — Telephone Encounter (Signed)
Received refill request electronically Last office visit 03/10/16 See allergy/contraindication

## 2016-03-17 NOTE — Telephone Encounter (Signed)
Refill sent to pharmacy as instructed. 

## 2016-03-18 ENCOUNTER — Telehealth: Payer: Self-pay

## 2016-03-18 NOTE — Telephone Encounter (Signed)
Pt left v/m;[facility] will be sending brand exception form request; pt request form filled out for lotrel because generic does not work. Pt request cb if needed.

## 2016-03-19 NOTE — Telephone Encounter (Signed)
Please let pt know, thanks  

## 2016-03-19 NOTE — Telephone Encounter (Signed)
Exception request was denied I read the fax and it says that pt's request for a lower co-pay was denied due to pt paying the lowest amount possible for this medication under pt's benefit plan, form placed in your inbox for review

## 2016-03-20 NOTE — Telephone Encounter (Signed)
Left voicemail letting pt know

## 2016-03-25 NOTE — Telephone Encounter (Signed)
Pt left v/m requesting call to [phone] for lotrel; pt thinks was denied in error. Brand exception denied because not looking at the current rx in effect. Pt request cb when completed.

## 2016-03-27 NOTE — Telephone Encounter (Signed)
Pt request cb about brand exception.

## 2016-04-01 ENCOUNTER — Encounter: Payer: Self-pay | Admitting: Family Medicine

## 2016-04-01 NOTE — Telephone Encounter (Signed)
Spoke with [facility] again and they advise me that pt doesn't have any penalty on file for brand Rx, that is just pt's co-pay for the name brand and the generic is $25, and from our end there is no exception we can request he is just paying the amount his insurance charges him  Sent pt a mychart message letting him know

## 2016-04-06 ENCOUNTER — Other Ambulatory Visit: Payer: Self-pay | Admitting: Family Medicine

## 2016-05-12 ENCOUNTER — Telehealth: Payer: Self-pay | Admitting: *Deleted

## 2016-05-12 NOTE — Telephone Encounter (Signed)
Form is in your in box

## 2016-05-12 NOTE — Telephone Encounter (Signed)
PT brought in a form for Brand Penalty Exception Request. Please fill it out and fax it back [phone]. Form placed in prescription tower. Please call the patient when it has been faxed. [phone].

## 2016-05-13 NOTE — Telephone Encounter (Signed)
Form faxed and pt notified

## 2016-05-13 NOTE — Telephone Encounter (Signed)
Done and in IN box 

## 2016-05-16 NOTE — Telephone Encounter (Signed)
Pt's notified PA was approved, letter placed in Dr. Marliss Coots inbox to sign and send for scanning, also copy of letter mailed to pt per pt request

## 2016-09-16 ENCOUNTER — Ambulatory Visit (INDEPENDENT_AMBULATORY_CARE_PROVIDER_SITE_OTHER): Payer: Managed Care, Other (non HMO) | Admitting: Family Medicine

## 2016-09-16 ENCOUNTER — Encounter: Payer: Self-pay | Admitting: Family Medicine

## 2016-09-16 VITALS — BP 118/68 | HR 80 | Temp 100.2°F | Ht 70.0 in | Wt 288.5 lb

## 2016-09-16 DIAGNOSIS — R509 Fever, unspecified: Secondary | ICD-10-CM

## 2016-09-16 DIAGNOSIS — B9789 Other viral agents as the cause of diseases classified elsewhere: Secondary | ICD-10-CM

## 2016-09-16 DIAGNOSIS — J069 Acute upper respiratory infection, unspecified: Secondary | ICD-10-CM | POA: Diagnosis not present

## 2016-09-16 LAB — POC INFLUENZA A&B (BINAX/QUICKVUE)
INFLUENZA B, POC: NEGATIVE
Influenza A, POC: NEGATIVE

## 2016-09-16 MED ORDER — HYDROCODONE-HOMATROPINE 5-1.5 MG/5ML PO SYRP: 5.0000 mL | ORAL_SOLUTION | Freq: Four times a day (QID) | ORAL | 0 refills | Status: DC | PRN

## 2016-09-16 MED ORDER — BENZONATATE 200 MG PO CAPS: 200.0000 mg | ORAL_CAPSULE | Freq: Three times a day (TID) | ORAL | 0 refills | Status: DC | PRN

## 2016-09-16 NOTE — Assessment & Plan Note (Signed)
With neg flu test  Re assuring exam Tessalon prn cough  Hycodan pm with warning of sedation  Disc symptomatic care - see instructions on AVS Update if not starting to improve in a week or if worsening

## 2016-09-16 NOTE — Patient Instructions (Addendum)
Flu test is negative  I think you have a viral upper respiratory syndrome  Try tessalon for cough  For night time - hycodan with caution of sedation  Fluids Rest Steam  Nasal saline  Acetaminophen for fever /aches up to every 4-6 hours    Update if not starting to improve in a week or if worsening     Upper Respiratory Infection, Adult Most upper respiratory infections (URIs) are a viral infection of the air passages leading to the lungs. A URI affects the nose, throat, and upper air passages. The most common type of URI is nasopharyngitis and is typically referred to as "the common cold." URIs run their course and usually go away on their own. Most of the time, a URI does not require medical attention, but sometimes a bacterial infection in the upper airways can follow a viral infection. This is called a secondary infection. Sinus and middle ear infections are common types of secondary upper respiratory infections. Bacterial pneumonia can also complicate a URI. A URI can worsen asthma and chronic obstructive pulmonary disease (COPD). Sometimes, these complications can require emergency medical care and may be life threatening. What are the causes? Almost all URIs are caused by viruses. A virus is a type of germ and can spread from one person to another. What increases the risk? You may be at risk for a URI if:  You smoke.  You have chronic heart or lung disease.  You have a weakened defense (immune) system.  You are very young or very old.  You have nasal allergies or asthma.  You work in crowded or poorly ventilated areas.  You work in health care facilities or schools. What are the signs or symptoms? Symptoms typically develop 2-3 days after you come in contact with a cold virus. Most viral URIs last 7-10 days. However, viral URIs from the influenza virus (flu virus) can last 14-18 days and are typically more severe. Symptoms may include:  Runny or stuffy (congested)  nose.  Sneezing.  Cough.  Sore throat.  Headache.  Fatigue.  Fever.  Loss of appetite.  Pain in your forehead, behind your eyes, and over your cheekbones (sinus pain).  Muscle aches. How is this diagnosed? Your health care provider may diagnose a URI by:  Physical exam.  Tests to check that your symptoms are not due to another condition such as:  Strep throat.  Sinusitis.  Pneumonia.  Asthma. How is this treated? A URI goes away on its own with time. It cannot be cured with medicines, but medicines may be prescribed or recommended to relieve symptoms. Medicines may help:  Reduce your fever.  Reduce your cough.  Relieve nasal congestion. Follow these instructions at home:  Take medicines only as directed by your health care provider.  Gargle warm saltwater or take cough drops to comfort your throat as directed by your health care provider.  Use a warm mist humidifier or inhale steam from a shower to increase air moisture. This may make it easier to breathe.  Drink enough fluid to keep your urine clear or pale yellow.  Eat soups and other clear broths and maintain good nutrition.  Rest as needed.  Return to work when your temperature has returned to normal or as your health care provider advises. You may need to stay home longer to avoid infecting others. You can also use a face mask and careful hand washing to prevent spread of the virus.  Increase the usage of your inhaler  if you have asthma.  Do not use any tobacco products, including cigarettes, chewing tobacco, or electronic cigarettes. If you need help quitting, ask your health care provider. How is this prevented? The best way to protect yourself from getting a cold is to practice good hygiene.  Avoid oral or hand contact with people with cold symptoms.  Wash your hands often if contact occurs. There is no clear evidence that vitamin C, vitamin E, echinacea, or exercise reduces the chance of  developing a cold. However, it is always recommended to get plenty of rest, exercise, and practice good nutrition. Contact a health care provider if:  You are getting worse rather than better.  Your symptoms are not controlled by medicine.  You have chills.  You have worsening shortness of breath.  You have brown or red mucus.  You have yellow or brown nasal discharge.  You have pain in your face, especially when you bend forward.  You have a fever.  You have swollen neck glands.  You have pain while swallowing.  You have white areas in the back of your throat. Get help right away if:  You have severe or persistent:  Headache.  Ear pain.  Sinus pain.  Chest pain.  You have chronic lung disease and any of the following:  Wheezing.  Prolonged cough.  Coughing up blood.  A change in your usual mucus.  You have a stiff neck.  You have changes in your:  Vision.  Hearing.  Thinking.  Mood. This information is not intended to replace advice given to you by your health care provider. Make sure you discuss any questions you have with your health care provider. Document Released: 02/11/2001 Document Revised: 04/20/2016 Document Reviewed: 11/23/2013 Elsevier Interactive Patient Education  2017 [employer].

## 2016-09-16 NOTE — Progress Notes (Signed)
Subjective:    Patient ID: Jeffrey Hartman, male    DOB: 1960/12/24, 56 y.o.   MRN: [id number]  HPI Her for uri symptoms with fever   Flu test   Wt Readings from Last 3 Encounters:  09/16/16 288 lb 8 oz (130.9 kg)  03/10/16 275 lb 8 oz (125 kg)  10/29/15 278 lb (126.1 kg)    Temp: 100.2 F (37.9 C)   Had flu shot in fall   Started symptoms Sunday night  Runny nose  Mild headache  Monday am  - cough started - dry and hacky  Some pnd also  Thought he was getting a cold Then chills/sweats and body aches and fatigue  Went straight to bed after work   Has not checked temp at home   No n/v/d   Otc theraflu/mucinex/ nyquil   Results for orders placed or performed in visit on 09/16/16  POC Influenza A&B (Binax test)  Result Value Ref Range   Influenza A, POC Negative Negative   Influenza B, POC Negative Negative      Patient Active Problem List   Diagnosis Date Noted  . Eczema 08/24/2015  . Cerumen impaction 06/16/2014  . Low testosterone 11/09/2013  . Prostate cancer screening 11/02/2013  . Adenomatous colon polyp 07/28/2012  . Family history of malignant neoplasm of gastrointestinal tract 06/29/2012  . Anal fissure 06/29/2012  . Personal history of colonic polyps 06/29/2012  . Routine general medical examination at a health care facility 09/17/2011  . Obesity 01/01/2010  . Allergic rhinitis 10/13/2008  . Obstructive sleep apnea 08/11/2007  . Obsessive-compulsive disorder 01/21/2007  . ACTINIC SKIN DAMAGE 01/21/2007  . ONYCHOMYCOSIS 01/13/2007  . Hyperlipidemia 01/13/2007  . Essential hypertension 01/13/2007   Past Medical History:  Diagnosis Date  . Anxiety states   . Dermatophytosis of nail   . Obsessive-compulsive disorders   . Obstructive sleep apnea (adult) (pediatric)   . Other and unspecified hyperlipidemia   . Seasonal allergic rhinitis   . Unspecified dermatitis due to sun   . Unspecified essential hypertension   . Unspecified sleep apnea     Past Surgical History:  Procedure Laterality Date  . COLONOSCOPY  11/02, 11/05   polyps  . CYSTECTOMY     cyst on hand   Social History  Substance Use Topics  . Smoking status: Never Smoker  . Smokeless tobacco: Never Used  . Alcohol use 0.0 oz/week     Comment: occasionally,1 beer once month   Family History  Problem Relation Age of Onset  . Hypertension Father   . Diabetes type II Father   . Cancer - Other Father 45    Adrenal Cancer  . Colon cancer Mother 32  . Prostate cancer Neg Hx    Allergies  Allergen Reactions  . Shellfish Allergy Shortness Of Breath and Other (See Comments)    Reaction=tunnel vision  . Amlodipine Besy-Benazepril Hcl     REACTION: Generic med did not adequatly control htnh  . Sertraline Hcl Nausea Only    REACTION: nausea, ED   Current Outpatient Prescriptions on File Prior to Visit  Medication Sig Dispense Refill  . aspirin 81 MG tablet Take 81 mg by mouth daily.      . Azelastine HCl (ASTEPRO) 0.15 % SOLN Place 1 spray into both nostrils 2 (two) times daily. (Patient taking differently: Place 1 spray into both nostrils 2 (two) times daily as needed. ) 90 mL 3  . Bioflavonoid Products (BIOFLEX PO) Take 1 capsule  by mouth daily.    . clomiPHENE (CLOMID) 50 MG tablet Take 50 mg by mouth at bedtime.    Mariane Baumgarten Calcium (STOOL SOFTENER PO) Take by mouth daily.    Marland Kitchen escitalopram (LEXAPRO) 20 MG tablet TAKE 1 TABLET DAILY 90 tablet 3  . Fexofenadine HCl (ALLEGRA PO) Take 1 tablet by mouth daily. As directed    . fish oil-omega-3 fatty acids 1000 MG capsule Take 1 g by mouth daily.     . fluticasone (FLONASE) 50 MCG/ACT nasal spray Place 2 sprays into both nostrils daily as needed. 48 g 3  . hydrocortisone (ANUSOL-HC) 25 MG suppository Place 1 suppository (25 mg total) rectally every evening. As needed for 7 days as needed for rectal pain or bleeding 14 suppository 2  . LOTREL 5-10 MG capsule TAKE 1 CAPSULE DAILY 90 capsule 2  . Melatonin 5 MG  TABS Take 10 mg by mouth at bedtime.    . Multiple Vitamin (MULTIVITAMIN) capsule Take 1 capsule by mouth daily.    . simvastatin (ZOCOR) 20 MG tablet TAKE 1 TABLET DAILY 90 tablet 2  . triamcinolone cream (KENALOG) 0.5 % Apply 1 application topically 2 (two) times daily. To affected area/rash (Patient taking differently: Apply 1 application topically 2 (two) times daily as needed. To affected area/rash) 15 g 1   No current facility-administered medications on file prior to visit.      Review of Systems  Constitutional: Positive for appetite change, fatigue and fever.  HENT: Positive for congestion, postnasal drip, rhinorrhea, sinus pressure, sneezing and sore throat. Negative for ear pain.   Eyes: Negative for pain and discharge.  Respiratory: Positive for cough. Negative for shortness of breath, wheezing and stridor.   Cardiovascular: Negative for chest pain.  Gastrointestinal: Negative for diarrhea, nausea and vomiting.  Genitourinary: Negative for frequency, hematuria and urgency.  Musculoskeletal: Negative for arthralgias and myalgias.  Skin: Negative for rash.  Neurological: Positive for headaches. Negative for dizziness, weakness and light-headedness.  Psychiatric/Behavioral: Negative for confusion and dysphoric mood.       Objective:   Physical Exam  Constitutional: He appears well-developed and well-nourished. No distress.  Fatigued appearing  HENT:  Head: Normocephalic and atraumatic.  Right Ear: External ear normal.  Left Ear: External ear normal.  Mouth/Throat: Oropharynx is clear and moist.  Nares are injected and congested  No sinus tenderness Clear rhinorrhea and post nasal drip   Eyes: Conjunctivae and EOM are normal. Pupils are equal, round, and reactive to light. Right eye exhibits no discharge. Left eye exhibits no discharge.  Neck: Normal range of motion. Neck supple.  Cardiovascular: Normal rate and normal heart sounds.   Pulmonary/Chest: Effort normal and  breath sounds normal. No respiratory distress. He has no wheezes. He has no rales. He exhibits no tenderness.  Good air exch Harsh cough   Lymphadenopathy:    He has no cervical adenopathy.  Neurological: He is alert.  Skin: Skin is warm and dry. No rash noted.  Psychiatric: He has a normal mood and affect.          Assessment & Plan:   Problem List Items Addressed This Visit      Respiratory   Viral URI with cough    With neg flu test  Re assuring exam Tessalon prn cough  Hycodan pm with warning of sedation  Disc symptomatic care - see instructions on AVS Update if not starting to improve in a week or if worsening  Other Visit Diagnoses    Fever, unspecified fever cause    -  Primary   Relevant Orders   POC Influenza A&B (Binax test) (Completed)

## 2016-09-16 NOTE — Progress Notes (Signed)
Pre visit review using our clinic review tool, if applicable. No additional management support is needed unless otherwise documented below in the visit note. 

## 2016-09-18 ENCOUNTER — Encounter: Payer: Self-pay | Admitting: Family Medicine

## 2016-10-22 ENCOUNTER — Telehealth: Payer: Self-pay | Admitting: Pulmonary Disease

## 2016-10-22 DIAGNOSIS — G4733 Obstructive sleep apnea (adult) (pediatric): Secondary | ICD-10-CM

## 2016-10-22 NOTE — Telephone Encounter (Signed)
Okay to send prescription for - ResMed air mini AutoSet Please obtain his current CPAP settings from DME for our record

## 2016-10-22 NOTE — Telephone Encounter (Signed)
Attempted to contact [facility] to obtain pt's CPAP pressure. There was no answer and I could not leave a message. Will try back before sending CPAP order.

## 2016-10-22 NOTE — Telephone Encounter (Signed)
Pt called in wanting a written rx for a travel cpap machine that he wants to buy straight out. He states it is nothing wrong with the one that he uses currently just that it is too big and bulky and he wants something lighter to travel with. He wants to order a Resmed Airmini autoset. Him and his wife is planning to leave the country in April.  RA Please advise if you are willing to give this rx.

## 2016-10-23 NOTE — Telephone Encounter (Signed)
Okay to provide prescription for 11 cm-CPAP mini

## 2016-10-23 NOTE — Telephone Encounter (Signed)
Pt is not wanting to use [facility] he wants to go through [email or web address] to get new ins, b/c his insurance will not cover through [facility], plase call pt on update for this he can be reached @ [phone].Hillery Hunter

## 2016-10-23 NOTE — Telephone Encounter (Signed)
LM for pt x 1  

## 2016-10-23 NOTE — Telephone Encounter (Signed)
RA   Spoke with [facility] and they stated his pressure is set at 11cm. Pt wants a printed rx for the travel cpap that he will purchase from [email or web address]

## 2016-10-24 NOTE — Telephone Encounter (Signed)
Pt called back and he is aware that RA will sign the rx and we will call him once this is ready to be picked up.  Will forward to [facility] to follow up on rx.  thanks

## 2016-10-24 NOTE — Telephone Encounter (Signed)
Pt came by office to pick up rx, this was not in brown accordion folder. New rx had to be re-printed and signed by RA. Rx given to pt. Nothing further needed at this time.

## 2016-10-24 NOTE — Telephone Encounter (Signed)
Left message for patient that the RX is ready for pickup.

## 2016-10-24 NOTE — Addendum Note (Signed)
Addended by: Collier Salina on: 10/24/2016 05:04 PM   Modules accepted: Orders

## 2016-10-27 ENCOUNTER — Telehealth: Payer: Self-pay | Admitting: Pulmonary Disease

## 2016-10-27 NOTE — Telephone Encounter (Signed)
Pt calling back again to speak to nurse.Hillery Hunter

## 2016-10-27 NOTE — Telephone Encounter (Signed)
Pt states his rx was rejected by the online company due to office number and address missing and the rx not being on a prescription pad. Informed the pt RA would not be in the office until Monday 11/03/16. Pt understood and I informed him as far as a fax that we are suppose to receive we have not received anything. He stated he will have them refax to [phone].  Will forward to Cherina to have RA sign on a prescription pad on Monday

## 2016-10-27 NOTE — Telephone Encounter (Signed)
Papers are in RA's folder.

## 2016-10-28 NOTE — Telephone Encounter (Signed)
Received paperwork from company, it has been placed in RA's folder as well.

## 2016-10-31 NOTE — Telephone Encounter (Signed)
Pt. Has an appt with RA on 11/03/16 at 2pm. Will give paperwork to him then.

## 2016-11-03 ENCOUNTER — Ambulatory Visit (INDEPENDENT_AMBULATORY_CARE_PROVIDER_SITE_OTHER): Payer: Managed Care, Other (non HMO) | Admitting: Pulmonary Disease

## 2016-11-03 ENCOUNTER — Encounter: Payer: Self-pay | Admitting: Pulmonary Disease

## 2016-11-03 DIAGNOSIS — J301 Allergic rhinitis due to pollen: Secondary | ICD-10-CM | POA: Diagnosis not present

## 2016-11-03 DIAGNOSIS — G4733 Obstructive sleep apnea (adult) (pediatric): Secondary | ICD-10-CM

## 2016-11-03 NOTE — Telephone Encounter (Signed)
Pt was seen today by Dr. Elsworth Soho. RX was signed and faxed to [email or web address] Pt. Also has a copy of the RX. Nothing is needed at this time.

## 2016-11-03 NOTE — Assessment & Plan Note (Signed)
Ct astepro

## 2016-11-03 NOTE — Patient Instructions (Signed)
Prescription for Air Mini will be sent to [email or web address]  Bring the chips so we can review report on your machine

## 2016-11-03 NOTE — Progress Notes (Signed)
   Subjective:    Patient ID: ARJAN STANTON, male    DOB: Jan 15, 1961, 56 y.o.   MRN: [id number]  HPI  Annual FU of OSA  He reports that the CPAP is set at 11 cm and this has really helped improve his daytime somnolence and fatigue. DME - [facility]  He travels [place] a lot and would like an air many machine from [email or web address]. He likes his nasal pillows and denies any problems with nasal stuffiness or pressure. He reports good usage of his machine and an occasional night if he forgets, or falls asleep without it, he feels gasping episodes that wake him up Denies dryness. His weight is maintained at 280 pounds   NPSG 2000:  AHI 15/hr.   Review of Systems Patient denies significant dyspnea,cough, hemoptysis,  chest pain, palpitations, pedal edema, orthopnea, paroxysmal nocturnal dyspnea, lightheadedness, nausea, vomiting, abdominal or  leg pains      Objective:   Physical Exam  Gen. Pleasant, obese, in no distress ENT - no lesions, no post nasal drip Neck: No JVD, no thyromegaly, no carotid bruits Lungs: no use of accessory muscles, no dullness to percussion, decreased without rales or rhonchi  Cardiovascular: Rhythm regular, heart sounds  normal, no murmurs or gallops, no peripheral edema Musculoskeletal: No deformities, no cyanosis or clubbing , no tremors        Assessment & Plan:

## 2016-11-03 NOTE — Assessment & Plan Note (Signed)
Prescription for Air Mini will be sent to [email or web address]  Bring the chips so we can review report on your machine  Weight loss encouraged, compliance with goal of at least 4-6 hrs every night is the expectation. Advised against medications with sedative side effects Cautioned against driving when sleepy - understanding that sleepiness will vary on a day to day basis

## 2016-11-12 ENCOUNTER — Encounter: Payer: Self-pay | Admitting: Pulmonary Disease

## 2016-11-13 ENCOUNTER — Encounter: Payer: Self-pay | Admitting: Pulmonary Disease

## 2016-11-13 ENCOUNTER — Telehealth: Payer: Self-pay | Admitting: Pulmonary Disease

## 2016-11-13 NOTE — Telephone Encounter (Signed)
Download received off of card, and placed in RA's look-at.  SD card given back to patient.  RA please advise on download. Thanks!

## 2016-11-17 NOTE — Telephone Encounter (Signed)
Good control of events on 11 cm No residuals, no leak Continue same pressure settings

## 2016-11-18 NOTE — Telephone Encounter (Signed)
Left message for patient to call back  

## 2016-11-18 NOTE — Telephone Encounter (Signed)
Patient also returning call - he can be reached at [phone] -pr

## 2016-12-31 ENCOUNTER — Other Ambulatory Visit: Payer: Self-pay | Admitting: Family Medicine

## 2017-02-14 ENCOUNTER — Other Ambulatory Visit: Payer: Self-pay | Admitting: Family Medicine

## 2017-03-15 ENCOUNTER — Telehealth: Payer: Self-pay | Admitting: Family Medicine

## 2017-03-15 DIAGNOSIS — Z0000 Encounter for general adult medical examination without abnormal findings: Secondary | ICD-10-CM

## 2017-03-15 DIAGNOSIS — Z125 Encounter for screening for malignant neoplasm of prostate: Secondary | ICD-10-CM

## 2017-03-15 NOTE — Telephone Encounter (Signed)
-----   Message from Marchia Bond sent at 03/11/2017 12:44 PM EDT ----- Regarding: Cpx labs Mon 7/16, need orders. Thanks :-) Please order  future cpx labs for pt's upcoming lab appt. Thanks Aniceto Boss

## 2017-03-16 ENCOUNTER — Other Ambulatory Visit (INDEPENDENT_AMBULATORY_CARE_PROVIDER_SITE_OTHER): Payer: Managed Care, Other (non HMO)

## 2017-03-16 DIAGNOSIS — Z0000 Encounter for general adult medical examination without abnormal findings: Secondary | ICD-10-CM

## 2017-03-16 DIAGNOSIS — Z125 Encounter for screening for malignant neoplasm of prostate: Secondary | ICD-10-CM | POA: Diagnosis not present

## 2017-03-16 LAB — CBC WITH DIFFERENTIAL/PLATELET
BASOS ABS: 0 10*3/uL (ref 0.0–0.1)
Basophils Relative: 0.7 % (ref 0.0–3.0)
Eosinophils Absolute: 0.2 10*3/uL (ref 0.0–0.7)
Eosinophils Relative: 2.9 % (ref 0.0–5.0)
HCT: 39.9 % (ref 39.0–52.0)
Hemoglobin: 13.3 g/dL (ref 13.0–17.0)
LYMPHS ABS: 1.5 10*3/uL (ref 0.7–4.0)
Lymphocytes Relative: 23.3 % (ref 12.0–46.0)
MCHC: 33.4 g/dL (ref 30.0–36.0)
MCV: 86.5 fl (ref 78.0–100.0)
MONO ABS: 0.5 10*3/uL (ref 0.1–1.0)
MONOS PCT: 8 % (ref 3.0–12.0)
NEUTROS PCT: 65.1 % (ref 43.0–77.0)
Neutro Abs: 4.3 10*3/uL (ref 1.4–7.7)
Platelets: 217 10*3/uL (ref 150.0–400.0)
RBC: 4.62 Mil/uL (ref 4.22–5.81)
RDW: 14.4 % (ref 11.5–15.5)
WBC: 6.6 10*3/uL (ref 4.0–10.5)

## 2017-03-16 LAB — COMPREHENSIVE METABOLIC PANEL
ALK PHOS: 35 U/L — AB (ref 39–117)
ALT: 20 U/L (ref 0–53)
AST: 15 U/L (ref 0–37)
Albumin: 4.1 g/dL (ref 3.5–5.2)
BILIRUBIN TOTAL: 0.3 mg/dL (ref 0.2–1.2)
BUN: 15 mg/dL (ref 6–23)
CO2: 30 mEq/L (ref 19–32)
CREATININE: 1.09 mg/dL (ref 0.40–1.50)
Calcium: 9.3 mg/dL (ref 8.4–10.5)
Chloride: 105 mEq/L (ref 96–112)
GFR: 74.88 mL/min (ref >60.00)
GLUCOSE: 97 mg/dL (ref 70–99)
Potassium: 4.6 mEq/L (ref 3.5–5.1)
SODIUM: 141 meq/L (ref 135–145)
TOTAL PROTEIN: 6.4 g/dL (ref 6.0–8.3)

## 2017-03-16 LAB — LIPID PANEL
Cholesterol: 148 mg/dL (ref 0–200)
HDL: 32.8 mg/dL — ABNORMAL LOW (ref >39.00)
LDL Cholesterol: 89 mg/dL (ref 0–99)
NONHDL: 114.75
Total CHOL/HDL Ratio: 4
Triglycerides: 128 mg/dL (ref 0.0–149.0)
VLDL: 25.6 mg/dL (ref 0.0–40.0)

## 2017-03-16 LAB — PSA: PSA: 1.5 ng/mL (ref 0.10–4.00)

## 2017-03-16 LAB — TSH: TSH: 2.37 u[IU]/mL (ref 0.35–4.50)

## 2017-03-20 ENCOUNTER — Ambulatory Visit (INDEPENDENT_AMBULATORY_CARE_PROVIDER_SITE_OTHER): Payer: Managed Care, Other (non HMO) | Admitting: Family Medicine

## 2017-03-20 ENCOUNTER — Encounter: Payer: Self-pay | Admitting: Family Medicine

## 2017-03-20 VITALS — BP 122/70 | HR 75 | Temp 98.8°F | Ht 71.0 in | Wt 287.8 lb

## 2017-03-20 DIAGNOSIS — Z800 Family history of malignant neoplasm of digestive organs: Secondary | ICD-10-CM

## 2017-03-20 DIAGNOSIS — F429 Obsessive-compulsive disorder, unspecified: Secondary | ICD-10-CM

## 2017-03-20 DIAGNOSIS — Z0000 Encounter for general adult medical examination without abnormal findings: Secondary | ICD-10-CM | POA: Diagnosis not present

## 2017-03-20 DIAGNOSIS — Z125 Encounter for screening for malignant neoplasm of prostate: Secondary | ICD-10-CM | POA: Diagnosis not present

## 2017-03-20 DIAGNOSIS — R7989 Other specified abnormal findings of blood chemistry: Secondary | ICD-10-CM

## 2017-03-20 DIAGNOSIS — E7800 Pure hypercholesterolemia, unspecified: Secondary | ICD-10-CM

## 2017-03-20 DIAGNOSIS — I10 Essential (primary) hypertension: Secondary | ICD-10-CM

## 2017-03-20 MED ORDER — NALTREXONE-BUPROPION HCL ER 8-90 MG PO TB12: 2.0000 | ORAL_TABLET | Freq: Two times a day (BID) | ORAL | 3 refills | Status: DC

## 2017-03-20 MED ORDER — LOTREL 5-10 MG PO CAPS: 1.0000 | ORAL_CAPSULE | Freq: Every day | ORAL | 3 refills | Status: DC

## 2017-03-20 MED ORDER — SIMVASTATIN 20 MG PO TABS: 20.0000 mg | ORAL_TABLET | Freq: Every day | ORAL | 3 refills | Status: DC

## 2017-03-20 NOTE — Progress Notes (Signed)
Subjective:    Patient ID: Jeffrey Hartman, male    DOB: [birth year], 56 y.o.   MRN: [id number]  HPI Here for health maintenance exam and to review chronic medical problems  Doing well overall  Hot summer  Had a great [place] trip in [place] Would love to go back again       Wt Readings from Last 3 Encounters:  03/20/17 287 lb 12 oz (130.5 kg)  11/03/16 280 lb (127 kg)  09/16/16 288 lb 8 oz (130.9 kg)  taking care of himself  Walks 30-45 min per day (2-3 miles)- hard to do in the heat  Has a work exercise program he can take advantage of  Working on diet - good days and bad days  Wife had bariatric surgery-he tries to eat what she eats  40.13 kg/m   He is interested in trying contrave   Colonoscopy 11/13 with 5 y recall - does not have it scheduled yet  Waiting to get his letter  Mother had colon cancer   Flu shot 9/17  Tetanus shot 4/14  zostavax 11/16  Prostate screen Has been back to the urologist  No symptoms of enl prostate  Lab Results  Component Value Date   PSA 1.50 03/16/2017   PSA 0.89 01/31/2016   PSA 1.39 01/23/2015  he is undergoing clomid tx for testosterone -doing very well No fam hx of prostate cancer    bp is stable today  No cp or palpitations or headaches or edema  No side effects to medicines  BP Readings from Last 3 Encounters:  03/20/17 122/70  11/03/16 124/80  09/16/16 118/68    Takes daw lotrel for HTN  The generic does not work   Hx of OCD- fairly stable / occ fluctuates/overall much better  Mood is generally good  Staying on lexapro  Hyperlipidemia Lab Results  Component Value Date   CHOL 148 03/16/2017   CHOL 171 01/31/2016   CHOL 159 01/23/2015   Lab Results  Component Value Date   HDL 32.80 (L) 03/16/2017   HDL 36.50 (L) 01/31/2016   HDL 31.70 (L) 01/23/2015   Lab Results  Component Value Date   LDLCALC 89 03/16/2017   LDLCALC 107 (H) 01/31/2016   LDLCALC 99 01/23/2015   Lab Results  Component Value Date   TRIG 128.0 03/16/2017   TRIG 136.0 01/31/2016   TRIG 142.0 01/23/2015   Lab Results  Component Value Date   CHOLHDL 4 03/16/2017   CHOLHDL 5 01/31/2016   CHOLHDL 5 01/23/2015   Lab Results  Component Value Date   LDLDIRECT 109.0 09/21/2009   On zocor and diet  LDL is down   Results for orders placed or performed in visit on 03/16/17  CBC with Differential/Platelet  Result Value Ref Range   WBC 6.6 4.0 - 10.5 K/uL   RBC 4.62 4.22 - 5.81 Mil/uL   Hemoglobin 13.3 13.0 - 17.0 g/dL   HCT 39.9 39.0 - 52.0 %   MCV 86.5 78.0 - 100.0 fl   MCHC 33.4 30.0 - 36.0 g/dL   RDW 14.4 11.5 - 15.5 %   Platelets 217.0 150.0 - 400.0 K/uL   Neutrophils Relative % 65.1 43.0 - 77.0 %   Lymphocytes Relative 23.3 12.0 - 46.0 %   Monocytes Relative 8.0 3.0 - 12.0 %   Eosinophils Relative 2.9 0.0 - 5.0 %   Basophils Relative 0.7 0.0 - 3.0 %   Neutro Abs 4.3 1.4 - 7.7 K/uL  Lymphs Abs 1.5 0.7 - 4.0 K/uL   Monocytes Absolute 0.5 0.1 - 1.0 K/uL   Eosinophils Absolute 0.2 0.0 - 0.7 K/uL   Basophils Absolute 0.0 0.0 - 0.1 K/uL  Comprehensive metabolic panel  Result Value Ref Range   Sodium 141 135 - 145 mEq/L   Potassium 4.6 3.5 - 5.1 mEq/L   Chloride 105 96 - 112 mEq/L   CO2 30 19 - 32 mEq/L   Glucose, Bld 97 70 - 99 mg/dL   BUN 15 6 - 23 mg/dL   Creatinine, Ser 1.09 0.40 - 1.50 mg/dL   Total Bilirubin 0.3 0.2 - 1.2 mg/dL   Alkaline Phosphatase 35 (L) 39 - 117 U/L   AST 15 0 - 37 U/L   ALT 20 0 - 53 U/L   Total Protein 6.4 6.0 - 8.3 g/dL   Albumin 4.1 3.5 - 5.2 g/dL   Calcium 9.3 8.4 - 10.5 mg/dL   GFR 74.88 >60.00 mL/min  Lipid panel  Result Value Ref Range   Cholesterol 148 0 - 200 mg/dL   Triglycerides 128.0 0.0 - 149.0 mg/dL   HDL 32.80 (L) >39.00 mg/dL   VLDL 25.6 0.0 - 40.0 mg/dL   LDL Cholesterol 89 0 - 99 mg/dL   Total CHOL/HDL Ratio 4    NonHDL 114.75   TSH  Result Value Ref Range   TSH 2.37 0.35 - 4.50 uIU/mL  PSA  Result Value Ref Range   PSA 1.50 0.10 - 4.00 ng/mL      Patient Active Problem List   Diagnosis Date Noted  . Eczema 08/24/2015  . Cerumen impaction 06/16/2014  . Low testosterone 11/09/2013  . Prostate cancer screening 11/02/2013  . Adenomatous colon polyp 07/28/2012  . Family history of malignant neoplasm of gastrointestinal tract 06/29/2012  . Anal fissure 06/29/2012  . Personal history of colonic polyps 06/29/2012  . Routine general medical examination at a health care facility 09/17/2011  . Morbid obesity ([place]) 01/01/2010  . Allergic rhinitis 10/13/2008  . Obstructive sleep apnea 08/11/2007  . Obsessive-compulsive disorder 01/21/2007  . ACTINIC SKIN DAMAGE 01/21/2007  . ONYCHOMYCOSIS 01/13/2007  . Hyperlipidemia 01/13/2007  . Essential hypertension 01/13/2007   Past Medical History:  Diagnosis Date  . Anxiety states   . Dermatophytosis of nail   . Obsessive-compulsive disorders   . Obstructive sleep apnea (adult) (pediatric)   . Other and unspecified hyperlipidemia   . Seasonal allergic rhinitis   . Unspecified dermatitis due to sun   . Unspecified essential hypertension   . Unspecified sleep apnea    Past Surgical History:  Procedure Laterality Date  . COLONOSCOPY  11/02, 11/05   polyps  . CYSTECTOMY     cyst on hand   Social History  Substance Use Topics  . Smoking status: Never Smoker  . Smokeless tobacco: Never Used  . Alcohol use 0.0 oz/week     Comment: occasionally,1 beer once month   Family History  Problem Relation Age of Onset  . Hypertension Father   . Diabetes type II Father   . Cancer - Other Father 34       Adrenal Cancer  . Colon cancer Mother 61  . Prostate cancer Neg Hx    Allergies  Allergen Reactions  . Shellfish Allergy Shortness Of Breath and Other (See Comments)    Reaction=tunnel vision  . Amlodipine Besy-Benazepril Hcl     REACTION: Generic med did not adequatly control htnh  . Sertraline Hcl Nausea Only  REACTION: nausea, ED   Current Outpatient Prescriptions on File Prior  to Visit  Medication Sig Dispense Refill  . aspirin 81 MG tablet Take 81 mg by mouth daily.      . Azelastine HCl (ASTEPRO) 0.15 % SOLN Place 1 spray into both nostrils 2 (two) times daily. (Patient taking differently: Place 1 spray into both nostrils 2 (two) times daily as needed. ) 90 mL 3  . Bioflavonoid Products (BIOFLEX PO) Take 1 capsule by mouth daily.    . clomiPHENE (CLOMID) 50 MG tablet Take 50 mg by mouth at bedtime.    Mariane Baumgarten Calcium (STOOL SOFTENER PO) Take by mouth daily.    Marland Kitchen escitalopram (LEXAPRO) 20 MG tablet TAKE 1 TABLET DAILY 90 tablet 3  . Fexofenadine HCl (ALLEGRA PO) Take 1 tablet by mouth daily. As directed    . fish oil-omega-3 fatty acids 1000 MG capsule Take 1 g by mouth daily.     . fluticasone (FLONASE) 50 MCG/ACT nasal spray Place 2 sprays into both nostrils daily as needed. 48 g 3  . hydrocortisone (ANUSOL-HC) 25 MG suppository Place 1 suppository (25 mg total) rectally every evening. As needed for 7 days as needed for rectal pain or bleeding 14 suppository 2  . Melatonin 5 MG TABS Take 10 mg by mouth at bedtime.    . Multiple Vitamin (MULTIVITAMIN) capsule Take 1 capsule by mouth daily.    Marland Kitchen triamcinolone cream (KENALOG) 0.5 % Apply 1 application topically 2 (two) times daily. To affected area/rash (Patient taking differently: Apply 1 application topically 2 (two) times daily as needed. To affected area/rash) 15 g 1   No current facility-administered medications on file prior to visit.     Review of Systems Review of Systems  Constitutional: Negative for fever, appetite change, fatigue and unexpected weight change.  Eyes: Negative for pain and visual disturbance.  Respiratory: Negative for cough and shortness of breath.   Cardiovascular: Negative for cp or palpitations    Gastrointestinal: Negative for nausea, diarrhea and constipation.  Genitourinary: Negative for urgency and frequency.  Skin: Negative for pallor or rash   Neurological: Negative for  weakness, light-headedness, numbness and headaches.  Hematological: Negative for adenopathy. Does not bruise/bleed easily.  Psychiatric/Behavioral: Negative for dysphoric mood. The patient is not nervous/anxious.         Objective:   Physical Exam  Constitutional: He appears well-developed and well-nourished. No distress.  Morbidly obese and well appearing   HENT:  Head: Normocephalic and atraumatic.  Right Ear: External ear normal.  Left Ear: External ear normal.  Nose: Nose normal.  Mouth/Throat: Oropharynx is clear and moist.  Eyes: Pupils are equal, round, and reactive to light. Conjunctivae and EOM are normal. Right eye exhibits no discharge. Left eye exhibits no discharge. No scleral icterus.  Neck: Normal range of motion. Neck supple. No JVD present. Carotid bruit is not present. No thyromegaly present.  Cardiovascular: Normal rate, regular rhythm, normal heart sounds and intact distal pulses.  Exam reveals no gallop.   Pulmonary/Chest: Effort normal and breath sounds normal. No respiratory distress. He has no wheezes. He exhibits no tenderness.  Abdominal: Soft. Bowel sounds are normal. He exhibits no distension, no abdominal bruit and no mass. There is no tenderness.  Musculoskeletal: He exhibits no edema or tenderness.  Lymphadenopathy:    He has no cervical adenopathy.  Neurological: He is alert. He has normal reflexes. No cranial nerve deficit. He exhibits normal muscle tone. Coordination normal.  Skin: Skin is  warm and dry. No rash noted. No erythema. No pallor.  Solar lentigines diffusely   Thickened toe nails  Psychiatric: He has a normal mood and affect.  Talkative and cheerful          Assessment & Plan:   Problem List Items Addressed This Visit      Cardiovascular and Mediastinum   Essential hypertension - Primary    bp in fair control at this time  BP Readings from Last 1 Encounters:  03/20/17 122/70   No changes needed Disc lifstyle change with low  sodium diet and exercise  Continues daw lotrel       Relevant Medications   LOTREL 5-10 MG capsule   simvastatin (ZOCOR) 20 MG tablet     Other   Family history of malignant neoplasm of gastrointestinal tract    For colonoscopy in the fall      Hyperlipidemia    Disc goals for lipids and reasons to control them Rev labs with pt Rev low sat fat diet in detail Continue statin and diet       Relevant Medications   LOTREL 5-10 MG capsule   simvastatin (ZOCOR) 20 MG tablet   Low testosterone    Treated with clomid by urology      Morbid obesity ([place])    Discussed how this problem influences overall health and the risks it imposes  Reviewed plan for weight loss with lower calorie diet (via better food choices and also portion control or program like weight watchers) and exercise building up to or more than 30 minutes 5 days per week including some aerobic activity   Has struggled with wt loss  Wants to try Contrave Px done/ printed and info given He plans to try it if affordable Disc poss side eff including mood change -will watch closely      Relevant Medications   Naltrexone-Bupropion HCl ER 8-90 MG TB12   Obsessive-compulsive disorder    Stable with lexapro Reviewed stressors/ coping techniques/symptoms/ support sources/ tx options and side effects in detail today  Exercise encouraged       Prostate cancer screening    Lab Results  Component Value Date   PSA 1.50 03/16/2017   PSA 0.89 01/31/2016   PSA 1.39 01/23/2015    Sees urology  No new symptoms  Treated for testosterone deficiency       Routine general medical examination at a health care facility    Reviewed health habits including diet and exercise and skin cancer prevention Reviewed appropriate screening tests for age  Also reviewed health mt list, fam hx and immunization status , as well as social and family history   See HPI Labs reviewed  Disc plan for wt loss  Will have recall colonoscopy in  the fall

## 2017-03-20 NOTE — Patient Instructions (Addendum)
Don't forget to get your colonoscopy in the fall   Keep working on diet and exercise   If you want to try Contrave  Week one 1 pill in the am Week two  1 pill twice daily  Week three 2 pills am and 1 pm  Week 4 and maintenance 2 pills twice daily   If side effects-stop it and stop it

## 2017-03-22 NOTE — Assessment & Plan Note (Signed)
Discussed how this problem influences overall health and the risks it imposes  Reviewed plan for weight loss with lower calorie diet (via better food choices and also portion control or program like weight watchers) and exercise building up to or more than 30 minutes 5 days per week including some aerobic activity   Has struggled with wt loss  Wants to try Contrave Px done/ printed and info given He plans to try it if affordable Disc poss side eff including mood change -will watch closely

## 2017-03-22 NOTE — Assessment & Plan Note (Signed)
Disc goals for lipids and reasons to control them Rev labs with pt Rev low sat fat diet in detail Continue statin and diet  

## 2017-03-22 NOTE — Assessment & Plan Note (Signed)
bp in fair control at this time  BP Readings from Last 1 Encounters:  03/20/17 122/70   No changes needed Disc lifstyle change with low sodium diet and exercise  Continues daw lotrel

## 2017-03-22 NOTE — Assessment & Plan Note (Signed)
Reviewed health habits including diet and exercise and skin cancer prevention Reviewed appropriate screening tests for age  Also reviewed health mt list, fam hx and immunization status , as well as social and family history   See HPI Labs reviewed  Disc plan for wt loss  Will have recall colonoscopy in the fall

## 2017-03-22 NOTE — Assessment & Plan Note (Signed)
Lab Results  Component Value Date   PSA 1.50 03/16/2017   PSA 0.89 01/31/2016   PSA 1.39 01/23/2015    Sees urology  No new symptoms  Treated for testosterone deficiency

## 2017-03-22 NOTE — Assessment & Plan Note (Signed)
Treated with clomid by urology

## 2017-03-22 NOTE — Assessment & Plan Note (Signed)
Stable with lexapro Reviewed stressors/ coping techniques/symptoms/ support sources/ tx options and side effects in detail today  Exercise encouraged

## 2017-03-22 NOTE — Assessment & Plan Note (Signed)
For colonoscopy in the fall

## 2017-03-23 ENCOUNTER — Encounter: Payer: Self-pay | Admitting: Family Medicine

## 2017-03-30 ENCOUNTER — Other Ambulatory Visit: Payer: Self-pay | Admitting: Family Medicine

## 2017-05-05 ENCOUNTER — Encounter: Payer: Self-pay | Admitting: Gastroenterology

## 2017-05-14 ENCOUNTER — Encounter: Payer: Self-pay | Admitting: Family Medicine

## 2017-05-19 ENCOUNTER — Encounter: Payer: Self-pay | Admitting: Family Medicine

## 2017-05-19 NOTE — Telephone Encounter (Signed)
See mychart message. Form printed (under media tab), and placed in Dr. Marliss Coots inbox

## 2017-05-21 NOTE — Telephone Encounter (Signed)
I called pt and no answer so left voicemail asking him to either call the office back or respond to my mychart message. My message let him know about the waist cir.

## 2017-07-13 ENCOUNTER — Encounter: Payer: Self-pay | Admitting: Gastroenterology

## 2017-07-13 ENCOUNTER — Ambulatory Visit (INDEPENDENT_AMBULATORY_CARE_PROVIDER_SITE_OTHER): Payer: Managed Care, Other (non HMO) | Admitting: Gastroenterology

## 2017-07-13 VITALS — BP 126/80 | HR 72 | Ht 70.5 in | Wt 294.5 lb

## 2017-07-13 DIAGNOSIS — Z86010 Personal history of colonic polyps: Secondary | ICD-10-CM

## 2017-07-13 DIAGNOSIS — K648 Other hemorrhoids: Secondary | ICD-10-CM | POA: Diagnosis not present

## 2017-07-13 DIAGNOSIS — Z800 Family history of malignant neoplasm of digestive organs: Secondary | ICD-10-CM

## 2017-07-13 MED ORDER — SUPREP BOWEL PREP KIT 17.5-3.13-1.6 GM/177ML PO SOLN: ORAL | 0 refills | Status: DC

## 2017-07-13 NOTE — Progress Notes (Signed)
HPI :  56 y/o male with history of OSA, hemorrhoids, colon polyps, previously seen by Dr. Deatra Ina in 2013, new patient to me here for reassessment for hemorrhoid symptoms and scheduling for surveillance colonoscopy.  Mother with CRC at age 70, history of colonoscopy with colon polyps in 2005, and then 2008 negative. Last colonoscopy in 2013 showed one small adenoma.   He denies any bowel habit changes, but he does endorse symptomatic hemorrhoids. He is irritation and discomfort / swelling at times from hemorrhoids, and has had intermittent bleeding symptoms. He's tried using Anusol the past which hasn't helped too much. He feels a protrusion of tissue in his anal canal and wonders what this is. He denies straining or constipation otherwise. No weight loss.   Colonoscopy 07/23/2012 - 2 colon polyps, 1 adenoma    Past Medical History:  Diagnosis Date  . Anxiety states   . Dermatophytosis of nail   . Obsessive-compulsive disorders   . Obstructive sleep apnea (adult) (pediatric)   . Other and unspecified hyperlipidemia   . Seasonal allergic rhinitis   . Unspecified dermatitis due to sun   . Unspecified essential hypertension   . Unspecified sleep apnea      Past Surgical History:  Procedure Laterality Date  . COLONOSCOPY  11/02, 11/05   polyps  . CYSTECTOMY     cyst on hand   Family History  Problem Relation Age of Onset  . Hypertension Father   . Diabetes type II Father   . Cancer - Other Father 11       Adrenal Cancer  . Colon cancer Mother 68  . Prostate cancer Neg Hx    Social History   Tobacco Use  . Smoking status: Never Smoker  . Smokeless tobacco: Never Used  Substance Use Topics  . Alcohol use: Yes    Alcohol/week: 0.0 oz    Comment: occasionally,1 beer once month  . Drug use: No   Current Outpatient Medications  Medication Sig Dispense Refill  . aspirin 81 MG tablet Take 81 mg by mouth daily.      . Azelastine HCl (ASTEPRO) 0.15 % SOLN Place 1 spray  into both nostrils 2 (two) times daily. (Patient taking differently: Place 1 spray into both nostrils 2 (two) times daily as needed. ) 90 mL 3  . clomiPHENE (CLOMID) 50 MG tablet Take 50 mg by mouth at bedtime.    Mariane Baumgarten Calcium (STOOL SOFTENER PO) Take by mouth daily.    Marland Kitchen escitalopram (LEXAPRO) 20 MG tablet TAKE 1 TABLET DAILY 90 tablet 3  . Fexofenadine HCl (ALLEGRA PO) Take 1 tablet by mouth daily. As directed    . fish oil-omega-3 fatty acids 1000 MG capsule Take 1 g by mouth daily.     . fluticasone (FLONASE) 50 MCG/ACT nasal spray Place 2 sprays into both nostrils daily as needed. 48 g 3  . hydrocortisone (ANUSOL-HC) 25 MG suppository Place 1 suppository (25 mg total) rectally every evening. As needed for 7 days as needed for rectal pain or bleeding 14 suppository 2  . LOTREL 5-10 MG capsule Take 1 capsule by mouth daily. 90 capsule 3  . Melatonin 5 MG TABS Take 10 mg by mouth at bedtime.    . Multiple Vitamin (MULTIVITAMIN) capsule Take 1 capsule by mouth daily.    . simvastatin (ZOCOR) 20 MG tablet Take 1 tablet (20 mg total) by mouth daily. 90 tablet 3   No current facility-administered medications for this visit.  Allergies  Allergen Reactions  . Shellfish Allergy Shortness Of Breath and Other (See Comments)    Reaction=tunnel vision  . Amlodipine Besy-Benazepril Hcl     REACTION: Generic med did not adequatly control htnh  . Sertraline Hcl Nausea Only    REACTION: nausea, ED     Review of Systems: All systems reviewed and negative except where noted in HPI.   Lab Results  Component Value Date   WBC 6.6 03/16/2017   HGB 13.3 03/16/2017   HCT 39.9 03/16/2017   MCV 86.5 03/16/2017   PLT 217.0 03/16/2017    Lab Results  Component Value Date   CREATININE 1.09 03/16/2017   BUN 15 03/16/2017   NA 141 03/16/2017   K 4.6 03/16/2017   CL 105 03/16/2017   CO2 30 03/16/2017    Lab Results  Component Value Date   ALT 20 03/16/2017   AST 15 03/16/2017   ALKPHOS  35 (L) 03/16/2017   BILITOT 0.3 03/16/2017     Physical Exam: BP 126/80   Pulse 72   Ht 5' 10.5" (1.791 m)   Wt 294 lb 8 oz (133.6 kg)   BMI 41.66 kg/m  Constitutional: Pleasant,well-developed, male in no acute distress. HEENT: Normocephalic and atraumatic. Conjunctivae are normal. No scleral icterus. Neck supple.  Cardiovascular: Normal rate, regular rhythm.  Pulmonary/chest: Effort normal and breath sounds normal. No wheezing, rales or rhonchi. Abdominal: Soft, protuberant, nontender. There are no masses palpable. No hepatomegaly. DRE / Anoscopy - external skin tag, internal hemorrhoids in all positions Extremities: no edema Lymphadenopathy: No cervical adenopathy noted. Neurological: Alert and oriented to person place and time. Skin: Skin is warm and dry. No rashes noted. Psychiatric: Normal mood and affect. Behavior is normal.   ASSESSMENT AND PLAN: 56 year old male with a history of colon adenomas, family history of colon cancer, with symptomatic hemorrhoids.  Internal hemorrhoids - I suspect these are driving his symptoms, he has an external skin tag otherwise. I discussed options for hemorrhoid therapy with him to discuss medical therapy, banding, and surgical therapy. He has already failed a trial of Anusol and symptoms persist. I offered him hemorrhoid banding for which I think is a good candidate, following discussion of risks and benefits he wanted to proceed. I banded the RP hemorrhoid as outlined below and procedure note. He will follow up for repeat banding in 2-4 weeks.  History of colon polyps / family history of colon cancer - he is due for surveillance colonoscopy at this time. I discussed risks and benefits of optical colonoscopy and he wanted to proceed. Further recommendations pending the results.  Ridgeley Cellar, MD [facility] Pager [phone]  CC: Tower, Wynelle Fanny, MD     PROCEDURE NOTE: The patient presents with symptomatic grade II   hemorrhoids, requesting rubber band ligation of his/her hemorrhoidal disease.  All risks, benefits and alternative forms of therapy were described and informed consent was obtained.  In the Left Lateral Decubitus position anoscopic examination revealed grade II hemorrhoids in all positions.  The anorectum was pre-medicated with 0.125% nitroglycerin ointment The decision was made to band the RP internal hemorrhoid, and the [place] was used to perform band ligation without complication.  Digital anorectal examination was then performed to assure proper positioning of the band, and to adjust the banded tissue as required.  The patient was discharged home without pain or other issues.  Dietary and behavioral recommendations were given and along with follow-up instructions.     The patient  will return in 2-4 for  follow-up and possible additional banding as required. No complications were encountered and the patient tolerated the procedure well.  Cooper Cellar, MD [facility] Pager [phone]

## 2017-07-13 NOTE — Patient Instructions (Addendum)
If you are age 54 or older, your body mass index should be between 23-30. Your Body mass index is 41.66 kg/m. If this is out of the aforementioned range listed, please consider follow up with your Primary Care Provider.  If you are age 13 or younger, your body mass index should be between 19-25. Your Body mass index is 41.66 kg/m. If this is out of the aformentioned range listed, please consider follow up with your Primary Care Provider.   HEMORRHOID BANDING PROCEDURE    FOLLOW-UP CARE   1. The procedure you have had should have been relatively painless since the banding of the area involved does not have nerve endings and there is no pain sensation.  The rubber band cuts off the blood supply to the hemorrhoid and the band may fall off as soon as 48 hours after the banding (the band may occasionally be seen in the toilet bowl following a bowel movement). You may notice a temporary feeling of fullness in the rectum which should respond adequately to plain Tylenol or Motrin.  2. Following the banding, avoid strenuous exercise that evening and resume full activity the next day.  A sitz bath (soaking in a warm tub) or bidet is soothing, and can be useful for cleansing the area after bowel movements.     3. To avoid constipation, take two tablespoons of natural wheat bran, natural oat bran, flax, Benefiber or any over the counter fiber supplement and increase your water intake to 7-8 glasses daily.    4. Unless you have been prescribed anorectal medication, do not put anything inside your rectum for two weeks: No suppositories, enemas, fingers, etc.  5. Occasionally, you may have more bleeding than usual after the banding procedure.  This is often from the untreated hemorrhoids rather than the treated one.  Don't be concerned if there is a tablespoon or so of blood.  If there is more blood than this, lie flat with your bottom higher than your head and apply an ice pack to the area. If the bleeding  does not stop within a half an hour or if you feel faint, call our office at [phone] or go to the emergency room.  6. Problems are not common; however, if there is a substantial amount of bleeding, severe pain, chills, fever or difficulty passing urine (very rare) or other problems, you should call us at [phone] or report to the nearest emergency room.  7. Do not stay seated continuously for more than 2-3 hours for a day or two after the procedure.  Tighten your buttock muscles 10-15 times every two hours and take 10-15 deep breaths every 1-2 hours.  Do not spend more than a few minutes on the toilet if you cannot empty your bowel; instead re-visit the toilet at a later time.   You have been scheduled for a colonoscopy. Please follow written instructions given to you at your visit today.  Please pick up your prep supplies at the pharmacy within the next 1-3 days. If you use inhalers (even only as needed), please bring them with you on the day of your procedure. Your physician has requested that you go to www.startemmi.com and enter the access code given to you at your visit today. This web site gives a general overview about your procedure. However, you should still follow specific instructions given to you by our office regarding your preparation for the procedure.    Thank you.

## 2017-07-15 ENCOUNTER — Encounter: Payer: Self-pay | Admitting: Gastroenterology

## 2017-07-21 ENCOUNTER — Ambulatory Visit (AMBULATORY_SURGERY_CENTER): Payer: Managed Care, Other (non HMO) | Admitting: Gastroenterology

## 2017-07-21 ENCOUNTER — Encounter: Payer: Self-pay | Admitting: Gastroenterology

## 2017-07-21 VITALS — BP 120/69 | HR 54 | Temp 98.9°F | Resp 16 | Ht 70.0 in | Wt 294.0 lb

## 2017-07-21 DIAGNOSIS — D123 Benign neoplasm of transverse colon: Secondary | ICD-10-CM

## 2017-07-21 DIAGNOSIS — D122 Benign neoplasm of ascending colon: Secondary | ICD-10-CM

## 2017-07-21 DIAGNOSIS — D126 Benign neoplasm of colon, unspecified: Secondary | ICD-10-CM | POA: Diagnosis not present

## 2017-07-21 DIAGNOSIS — Z86010 Personal history of colonic polyps: Secondary | ICD-10-CM | POA: Diagnosis not present

## 2017-07-21 DIAGNOSIS — D124 Benign neoplasm of descending colon: Secondary | ICD-10-CM | POA: Diagnosis not present

## 2017-07-21 DIAGNOSIS — K635 Polyp of colon: Secondary | ICD-10-CM | POA: Diagnosis not present

## 2017-07-21 MED ORDER — SODIUM CHLORIDE 0.9 % IV SOLN: 500.0000 mL | INTRAVENOUS | Status: DC

## 2017-07-21 NOTE — Progress Notes (Signed)
Report given to PACU, vss 

## 2017-07-21 NOTE — Progress Notes (Signed)
Called to room to assist during endoscopic procedure.  Patient ID and intended procedure confirmed with present staff. Received instructions for my participation in the procedure from the performing physician.  

## 2017-07-21 NOTE — Progress Notes (Signed)
Pt's states no medical or surgical changes since previsit or office visit. 

## 2017-07-21 NOTE — Patient Instructions (Signed)
   INFORMATION ON POLYPS,DIVERTICULOSIS,& HEMORRHOIDS GIVEN TO YOU TODAY   FOLLOW UP IN CLINIC AS SCHEDULED FOR ADDITIONAL BANDING   YOU HAD AN ENDOSCOPIC PROCEDURE TODAY AT [facility]:   Refer to the procedure report that was given to you for any specific questions about what was found during the examination.  If the procedure report does not answer your questions, please call your gastroenterologist to clarify.  If you requested that your care partner not be given the details of your procedure findings, then the procedure report has been included in a sealed envelope for you to review at your convenience later.  YOU SHOULD EXPECT: Some feelings of bloating in the abdomen. Passage of more gas than usual.  Walking can help get rid of the air that was put into your GI tract during the procedure and reduce the bloating. If you had a lower endoscopy (such as a colonoscopy or flexible sigmoidoscopy) you may notice spotting of blood in your stool or on the toilet paper. If you underwent a bowel prep for your procedure, you may not have a normal bowel movement for a few days.  Please Note:  You might notice some irritation and congestion in your nose or some drainage.  This is from the oxygen used during your procedure.  There is no need for concern and it should clear up in a day or so.  SYMPTOMS TO REPORT IMMEDIATELY:   Following lower endoscopy (colonoscopy or flexible sigmoidoscopy):  Excessive amounts of blood in the stool  Significant tenderness or worsening of abdominal pains  Swelling of the abdomen that is new, acute  Fever of 100F or higher   For urgent or emergent issues, a gastroenterologist can be reached at any hour by calling [phone].   DIET:  We do recommend a small meal at first, but then you may proceed to your regular diet.  Drink plenty of fluids but you should avoid alcoholic beverages for 24 hours.  ACTIVITY:  You should plan to take it easy for  the rest of today and you should NOT DRIVE or use heavy machinery until tomorrow (because of the sedation medicines used during the test).    FOLLOW UP: Our staff will call the number listed on your records the next business day following your procedure to check on you and address any questions or concerns that you may have regarding the information given to you following your procedure. If we do not reach you, we will leave a message.  However, if you are feeling well and you are not experiencing any problems, there is no need to return our call.  We will assume that you have returned to your regular daily activities without incident.  If any biopsies were taken you will be contacted by phone or by letter within the next 1-3 weeks.  Please call us at [phone] if you have not heard about the biopsies in 3 weeks.    SIGNATURES/CONFIDENTIALITY: You and/or your care partner have signed paperwork which will be entered into your electronic medical record.  These signatures attest to the fact that that the information above on your After Visit Summary has been reviewed and is understood.  Full responsibility of the confidentiality of this discharge information lies with you and/or your care-partner.

## 2017-07-21 NOTE — Op Note (Signed)
[place] Patient Name: Jeffrey Hartman Procedure Date: 07/21/2017 7:23 AM MRN: [id number] Endoscopist: Remo Lipps P. Ronnett Pullin MD, MD Age: 56 Referring MD:  Date of Birth: October 22, 1960 Gender: Male Account #: [id number] Procedure:                Colonoscopy Indications:              Surveillance: Personal history of adenomatous                            polyps on last colonoscopy 5 years ago, family                            history of colon cancer, history of hemorrhoids s/p                            banding Medicines:                Monitored Anesthesia Care Procedure:                Pre-Anesthesia Assessment:                           - Prior to the procedure, a History and Physical                            was performed, and patient medications and                            allergies were reviewed. The patient's tolerance of                            previous anesthesia was also reviewed. The risks                            and benefits of the procedure and the sedation                            options and risks were discussed with the patient.                            All questions were answered, and informed consent                            was obtained. Prior Anticoagulants: The patient has                            taken no previous anticoagulant or antiplatelet                            agents. ASA Grade Assessment: III - A patient with                            severe systemic disease. After reviewing the risks  and benefits, the patient was deemed in                            satisfactory condition to undergo the procedure.                           After obtaining informed consent, the colonoscope                            was passed under direct vision. Throughout the                            procedure, the patient's blood pressure, pulse, and                            oxygen saturations were monitored continuously.  The                            Colonoscope was introduced through the anus and                            advanced to the the cecum, identified by                            appendiceal orifice and ileocecal valve. The                            colonoscopy was performed without difficulty. The                            patient tolerated the procedure well. The quality                            of the bowel preparation was good. The ileocecal                            valve, appendiceal orifice, and rectum were                            photographed. Scope In: 8:01:03 AM Scope Out: 8:18:39 AM Scope Withdrawal Time: 0 hours 14 minutes 28 seconds  Total Procedure Duration: 0 hours 17 minutes 36 seconds  Findings:                 The perianal exam findings include a skin tag.                           A 3 mm polyp was found in the ascending colon. The                            polyp was sessile. The polyp was removed with a                            cold snare. Resection and retrieval were complete.  A 4 mm polyp was found in the transverse colon. The                            polyp was sessile. The polyp was removed with a                            cold snare. Resection and retrieval were complete.                           A 3 mm polyp was found in the descending colon. The                            polyp was sessile. The polyp was removed with a                            cold snare. Resection and retrieval were complete.                           A few medium-mouthed diverticula were found in the                            ascending colon.                           Internal hemorrhoids were found during retroflexion                            with stigmata of recent banding.                           The exam was otherwise without abnormality. Complications:            No immediate complications. Estimated blood loss:                             Minimal. Estimated Blood Loss:     Estimated blood loss was minimal. Impression:               - Perianal skin tag found on perianal exam.                           - One 3 mm polyp in the ascending colon, removed                            with a cold snare. Resected and retrieved.                           - One 4 mm polyp in the transverse colon, removed                            with a cold snare. Resected and retrieved.                           - One 3 mm polyp  in the descending colon, removed                            with a cold snare. Resected and retrieved.                           - Diverticulosis in the ascending colon.                           - Internal hemorrhoids with banding site visualized.                           - The examination was otherwise normal. Recommendation:           - Patient has a contact number available for                            emergencies. The signs and symptoms of potential                            delayed complications were discussed with the                            patient. Return to normal activities tomorrow.                            Written discharge instructions were provided to the                            patient.                           - Resume previous diet.                           - Continue present medications.                           - Await pathology results.                           - Repeat colonoscopy is recommended for                            surveillance. The colonoscopy date will be                            determined after pathology results from today's                            exam become available for review.                           - Follow up in the clinic for additional banding as  scheduled Remo Lipps P. Aliayah Tyer MD, MD 07/21/2017 8:24:19 AM This report has been signed electronically.

## 2017-07-22 ENCOUNTER — Telehealth: Payer: Self-pay | Admitting: *Deleted

## 2017-07-22 NOTE — Telephone Encounter (Signed)
  Follow up Call-  Call back number 07/21/2017  Post procedure Call Back phone  # [phone]  Permission to leave phone message Yes  Some recent data might be hidden     Patient questions:  Do you have a fever, pain , or abdominal swelling? No. Pain Score  0 *  Have you tolerated food without any problems? Yes.    Have you been able to return to your normal activities? Yes.    Do you have any questions about your discharge instructions: Diet   No. Medications  No. Follow up visit  No.  Do you have questions or concerns about your Care? No.  Actions: * If pain score is 4 or above: No action needed, pain <4.

## 2017-07-27 ENCOUNTER — Encounter: Payer: Managed Care, Other (non HMO) | Admitting: Gastroenterology

## 2017-07-28 ENCOUNTER — Encounter: Payer: Self-pay | Admitting: Gastroenterology

## 2017-08-06 ENCOUNTER — Encounter: Payer: Managed Care, Other (non HMO) | Admitting: Gastroenterology

## 2017-09-10 ENCOUNTER — Ambulatory Visit (INDEPENDENT_AMBULATORY_CARE_PROVIDER_SITE_OTHER): Payer: Managed Care, Other (non HMO) | Admitting: Gastroenterology

## 2017-09-10 ENCOUNTER — Encounter: Payer: Self-pay | Admitting: Gastroenterology

## 2017-09-10 VITALS — BP 138/74 | HR 85 | Ht 70.0 in | Wt 290.0 lb

## 2017-09-10 DIAGNOSIS — K648 Other hemorrhoids: Secondary | ICD-10-CM

## 2017-09-10 NOTE — Progress Notes (Signed)
Banded on 07/13/2017 to RP hemorrhoid.  Colonoscopy 07/21/2017 - 3 small polyps, banding site seen Overall symptoms of irritation improved after first banding. Had some mild bleeding recently. Otherwise okay but having some straining despite using colace. He wishes to proceed with additional banding today after discussion of options.    PROCEDURE NOTE: The patient presents with symptomatic grade II  hemorrhoids, requesting rubber band ligation of his/her hemorrhoidal disease.  All risks, benefits and alternative forms of therapy were described and informed consent was obtained.   The anorectum was pre-medicated with 0.125% nitroglycerin The decision was made to band the LL internal hemorrhoid, and the [place] was used to perform band ligation without complication. Digital anorectal examination was then performed to assure proper positioning of the band, and to adjust the banded tissue as required. The patient was discharged home without pain or other issues.  Dietary and behavioral recommendations were given and along with follow-up instructions.     The following adjunctive treatments were recommended: Citrucel once daily  The patient will return in 2-4 weeks for  follow-up and possible additional banding as required. No complications were encountered and the patient tolerated the procedure well.  [place] Cellar, MD [facility] Gastroenterology Pager [phone]

## 2017-09-10 NOTE — Patient Instructions (Signed)
If you are age 55 or older, your body mass index should be between 23-30. Your Body mass index is 41.61 kg/m. If this is out of the aforementioned range listed, please consider follow up with your Primary Care Provider.  If you are age 30 or younger, your body mass index should be between 19-25. Your Body mass index is 41.61 kg/m. If this is out of the aformentioned range listed, please consider follow up with your Primary Care Provider.   HEMORRHOID BANDING PROCEDURE    FOLLOW-UP CARE   1. The procedure you have had should have been relatively painless since the banding of the area involved does not have nerve endings and there is no pain sensation.  The rubber band cuts off the blood supply to the hemorrhoid and the band may fall off as soon as 48 hours after the banding (the band may occasionally be seen in the toilet bowl following a bowel movement). You may notice a temporary feeling of fullness in the rectum which should respond adequately to plain Tylenol or Motrin.  2. Following the banding, avoid strenuous exercise that evening and resume full activity the next day.  A sitz bath (soaking in a warm tub) or bidet is soothing, and can be useful for cleansing the area after bowel movements.     3. To avoid constipation, take two tablespoons of natural wheat bran, natural oat bran, flax, Benefiber or any over the counter fiber supplement and increase your water intake to 7-8 glasses daily.    4. Unless you have been prescribed anorectal medication, do not put anything inside your rectum for two weeks: No suppositories, enemas, fingers, etc.  5. Occasionally, you may have more bleeding than usual after the banding procedure.  This is often from the untreated hemorrhoids rather than the treated one.  Don't be concerned if there is a tablespoon or so of blood.  If there is more blood than this, lie flat with your bottom higher than your head and apply an ice pack to the area. If the bleeding  does not stop within a half an hour or if you feel faint, call our office at [phone] or go to the emergency room.  6. Problems are not common; however, if there is a substantial amount of bleeding, severe pain, chills, fever or difficulty passing urine (very rare) or other problems, you should call us at [phone] or report to the nearest emergency room.  7. Do not stay seated continuously for more than 2-3 hours for a day or two after the procedure.  Tighten your buttock muscles 10-15 times every two hours and take 10-15 deep breaths every 1-2 hours.  Do not spend more than a few minutes on the toilet if you cannot empty your bowel; instead re-visit the toilet at a later time.    Take Citrucel, over the counter, daily.  Thank you for entrusting me with your care and for [facility], Dr. [place] Cellar

## 2017-10-21 ENCOUNTER — Ambulatory Visit (INDEPENDENT_AMBULATORY_CARE_PROVIDER_SITE_OTHER): Payer: Managed Care, Other (non HMO) | Admitting: Gastroenterology

## 2017-10-21 ENCOUNTER — Encounter: Payer: Self-pay | Admitting: Gastroenterology

## 2017-10-21 VITALS — BP 130/84 | HR 72 | Ht 70.0 in | Wt 294.0 lb

## 2017-10-21 DIAGNOSIS — K648 Other hemorrhoids: Secondary | ICD-10-CM

## 2017-10-21 NOTE — Progress Notes (Signed)
Patient here for a follow up for banding of hemorrhoids. He has had RP and LL hemorrhoid banded with significant reduction in his symptoms. Now only scant bleeding at times, no further episodes of significant bleeding  PROCEDURE NOTE: The patient presents with symptomatic grade II  hemorrhoids, requesting rubber band ligation of his/her hemorrhoidal disease.  All risks, benefits and alternative forms of therapy were described and informed consent was obtained.  The anorectum was pre-medicated with 0.125% nitroglycerin The decision was made to band the RA internal hemorrhoid, and the [place] was used to perform band ligation without complication.  Digital anorectal examination was then performed to assure proper positioning of the band, and to adjust the banded tissue as required.  The patient was discharged home without pain or other issues.  Dietary and behavioral recommendations were given and along with follow-up instructions.     The following adjunctive treatments were recommended: Continue Citrucel  The patient will return as needed for recurrent symptoms. No further plans for additional banding as long as he continues to do well. No complications were encountered and the patient tolerated the procedure well.  [place] Cellar, MD [facility] Gastroenterology Pager [phone]

## 2017-10-21 NOTE — Patient Instructions (Addendum)
If you are age 55 or older, your body mass index should be between 23-30. Your Body mass index is 42.18 kg/m. If this is out of the aforementioned range listed, please consider follow up with your Primary Care Provider.  If you are age 36 or younger, your body mass index should be between 19-25. Your Body mass index is 42.18 kg/m. If this is out of the aformentioned range listed, please consider follow up with your Primary Care Provider.     HEMORRHOID BANDING PROCEDURE    FOLLOW-UP CARE   1. The procedure you have had should have been relatively painless since the banding of the area involved does not have nerve endings and there is no pain sensation.  The rubber band cuts off the blood supply to the hemorrhoid and the band may fall off as soon as 48 hours after the banding (the band may occasionally be seen in the toilet bowl following a bowel movement). You may notice a temporary feeling of fullness in the rectum which should respond adequately to plain Tylenol or Motrin.  2. Following the banding, avoid strenuous exercise that evening and resume full activity the next day.  A sitz bath (soaking in a warm tub) or bidet is soothing, and can be useful for cleansing the area after bowel movements.     3. To avoid constipation, take two tablespoons of natural wheat bran, natural oat bran, flax, Benefiber or any over the counter fiber supplement and increase your water intake to 7-8 glasses daily.    4. Unless you have been prescribed anorectal medication, do not put anything inside your rectum for two weeks: No suppositories, enemas, fingers, etc.  5. Occasionally, you may have more bleeding than usual after the banding procedure.  This is often from the untreated hemorrhoids rather than the treated one.  Don't be concerned if there is a tablespoon or so of blood.  If there is more blood than this, lie flat with your bottom higher than your head and apply an ice pack to the area. If the  bleeding does not stop within a half an hour or if you feel faint, call our office at [phone] or go to the emergency room.  6. Problems are not common; however, if there is a substantial amount of bleeding, severe pain, chills, fever or difficulty passing urine (very rare) or other problems, you should call us at [phone] or report to the nearest emergency room.  7. Do not stay seated continuously for more than 2-3 hours for a day or two after the procedure.  Tighten your buttock muscles 10-15 times every two hours and take 10-15 deep breaths every 1-2 hours.  Do not spend more than a few minutes on the toilet if you cannot empty your bowel; instead re-visit the toilet at a later time.   Thank you for entrusting me with your care and for choosing [facility], Dr. [place] Cellar

## 2017-11-06 ENCOUNTER — Ambulatory Visit: Payer: Managed Care, Other (non HMO) | Admitting: Adult Health

## 2017-11-24 ENCOUNTER — Ambulatory Visit: Payer: Managed Care, Other (non HMO) | Admitting: Adult Health

## 2017-12-21 ENCOUNTER — Ambulatory Visit (INDEPENDENT_AMBULATORY_CARE_PROVIDER_SITE_OTHER): Payer: Managed Care, Other (non HMO) | Admitting: Family Medicine

## 2017-12-21 ENCOUNTER — Encounter: Payer: Self-pay | Admitting: Family Medicine

## 2017-12-21 VITALS — BP 118/74 | HR 68 | Temp 98.4°F | Ht 70.0 in | Wt 290.8 lb

## 2017-12-21 DIAGNOSIS — L255 Unspecified contact dermatitis due to plants, except food: Secondary | ICD-10-CM | POA: Insufficient documentation

## 2017-12-21 MED ORDER — TRIAMCINOLONE ACETONIDE 0.5 % EX CREA: 1.0000 "application " | TOPICAL_CREAM | Freq: Three times a day (TID) | CUTANEOUS | 0 refills | Status: DC

## 2017-12-21 NOTE — Assessment & Plan Note (Signed)
L arm - (starting to heal)  Px triamcinolone cream 0.5% to apply up to tid Soap and water cleanse  Clean all clothes/tools that were in contact with plant  Antihistamine prn  If no imp-consider prednisone Disc s/s of bacterial infection to watch for  Update if not starting to improve in a week or if worsening   Handout given on poison ivy/oak dermatitis Will hire someone to get it out of yard

## 2017-12-21 NOTE — Patient Instructions (Signed)
Keep area clean with soap and water  Watch for signs of infection - increasing redness/swelling/fever   Use the triamcinolone cream up to three times daily  Update if not starting to improve in a week or if worsening    Antihistamines are ok for itch

## 2017-12-21 NOTE — Assessment & Plan Note (Signed)
Wt is down 4 lb Enc to keep up the good work with wt loss

## 2017-12-21 NOTE — Progress Notes (Signed)
Subjective:    Patient ID: Jeffrey Hartman, male    DOB: 02-12-1961, 57 y.o.   MRN: [id number]  HPI  Here for rash on L arm- poison ivy  Worked in yard/cleaning 2 weeks ago  Has never had plant dermatitis before  Several days later - started rash on itching on R arm  Became very itchy and blistery   Calamine Hydrocortisone  xanfel ? For itching  Benadryl topically  Takes allegra in the am   Starting to dry up now    Wt Readings from Last 3 Encounters:  12/21/17 290 lb 12 oz (131.9 kg)  10/21/17 294 lb (133.4 kg)  09/10/17 290 lb (131.5 kg)   41.72 kg/m    Patient Active Problem List   Diagnosis Date Noted  . Plant dermatitis 12/21/2017  . Eczema 08/24/2015  . Cerumen impaction 06/16/2014  . Low testosterone 11/09/2013  . Prostate cancer screening 11/02/2013  . Adenomatous colon polyp 07/28/2012  . Family history of malignant neoplasm of gastrointestinal tract 06/29/2012  . Anal fissure 06/29/2012  . Personal history of colonic polyps 06/29/2012  . Routine general medical examination at a health care facility 09/17/2011  . Morbid obesity ([place]) 01/01/2010  . Allergic rhinitis 10/13/2008  . Obstructive sleep apnea 08/11/2007  . Obsessive-compulsive disorder 01/21/2007  . ACTINIC SKIN DAMAGE 01/21/2007  . ONYCHOMYCOSIS 01/13/2007  . Hyperlipidemia 01/13/2007  . Essential hypertension 01/13/2007   Past Medical History:  Diagnosis Date  . Anxiety states   . Dermatophytosis of nail   . Obsessive-compulsive disorders   . Obstructive sleep apnea (adult) (pediatric)   . Other and unspecified hyperlipidemia   . Seasonal allergic rhinitis   . Unspecified dermatitis due to sun   . Unspecified essential hypertension   . Unspecified sleep apnea    Past Surgical History:  Procedure Laterality Date  . COLONOSCOPY  11/02, 11/05   polyps  . CYSTECTOMY     cyst on hand   Social History   Tobacco Use  . Smoking status: Never Smoker  . Smokeless tobacco: Never  Used  Substance Use Topics  . Alcohol use: Yes    Alcohol/week: 0.0 oz    Comment: occasionally,1 beer once month  . Drug use: No   Family History  Problem Relation Age of Onset  . Hypertension Father   . Diabetes type II Father   . Cancer - Other Father 74       Adrenal Cancer  . Colon cancer Mother 56  . Prostate cancer Neg Hx    Allergies  Allergen Reactions  . Shellfish Allergy Shortness Of Breath and Other (See Comments)    Reaction=tunnel vision  . Amlodipine Besy-Benazepril Hcl     REACTION: Generic med did not adequatly control htnh  . Sertraline Hcl Nausea Only    REACTION: nausea, ED   Current Outpatient Medications on File Prior to Visit  Medication Sig Dispense Refill  . aspirin 81 MG tablet Take 81 mg by mouth daily.      . Azelastine HCl (ASTEPRO) 0.15 % SOLN Place 1 spray into both nostrils 2 (two) times daily. (Patient taking differently: Place 1 spray into both nostrils 2 (two) times daily as needed. ) 90 mL 3  . clomiPHENE (CLOMID) 50 MG tablet Take 50 mg by mouth at bedtime.    Mariane Baumgarten Sodium 100 MG capsule Take 100 mg by mouth daily.    Marland Kitchen escitalopram (LEXAPRO) 20 MG tablet TAKE 1 TABLET DAILY 90 tablet 3  .  Fexofenadine HCl (ALLEGRA PO) Take 1 tablet by mouth daily. As directed    . fish oil-omega-3 fatty acids 1000 MG capsule Take 1 g by mouth daily.     . fluticasone (FLONASE) 50 MCG/ACT nasal spray Place 2 sprays into both nostrils daily as needed. 48 g 3  . hydrocortisone (ANUSOL-HC) 25 MG suppository Place 1 suppository (25 mg total) rectally every evening. As needed for 7 days as needed for rectal pain or bleeding 14 suppository 2  . LOTREL 5-10 MG capsule Take 1 capsule by mouth daily. 90 capsule 3  . Melatonin 5 MG TABS Take 10 mg by mouth at bedtime.    . Multiple Vitamin (MULTIVITAMIN) capsule Take 1 capsule by mouth daily.    . simvastatin (ZOCOR) 20 MG tablet Take 1 tablet (20 mg total) by mouth daily. 90 tablet 3   No current  facility-administered medications on file prior to visit.     Review of Systems  Constitutional: Negative for activity change, appetite change, fatigue, fever and unexpected weight change.  HENT: Negative for congestion, rhinorrhea, sore throat and trouble swallowing.   Eyes: Negative for pain, redness, itching and visual disturbance.  Respiratory: Negative for cough, chest tightness, shortness of breath and wheezing.   Cardiovascular: Negative for chest pain and palpitations.  Gastrointestinal: Negative for abdominal pain, blood in stool, constipation, diarrhea and nausea.  Endocrine: Negative for cold intolerance, heat intolerance, polydipsia and polyuria.  Genitourinary: Negative for difficulty urinating, dysuria, frequency and urgency.  Musculoskeletal: Negative for arthralgias, joint swelling and myalgias.  Skin: Positive for rash. Negative for pallor.  Neurological: Negative for dizziness, tremors, weakness, numbness and headaches.  Hematological: Negative for adenopathy. Does not bruise/bleed easily.  Psychiatric/Behavioral: Negative for decreased concentration and dysphoric mood. The patient is not nervous/anxious.        Objective:   Physical Exam  Constitutional: He appears well-developed and well-nourished. No distress.  obese and well appearing   HENT:  Head: Normocephalic and atraumatic.  Mouth/Throat: Oropharynx is clear and moist.  Neck: Normal range of motion. Neck supple.  Pulmonary/Chest: Effort normal. No respiratory distress.  Musculoskeletal: He exhibits no edema or tenderness.  Lymphadenopathy:    He has no cervical adenopathy.  Neurological: He is alert. No cranial nerve deficit. Coordination normal.  Skin: Skin is warm and dry. Rash noted.  L arm- vesicular rash -in process of drying up - over antecubital area - and 8-10 cm superior/inferior on arm  Some dry/scale Few excoriations One vesicle is unroofed and healing No signs of bacterial infection  No  pustules or drainage  Psychiatric: He has a normal mood and affect.  Pleasant           Assessment & Plan:   Problem List Items Addressed This Visit      Musculoskeletal and Integument   Plant dermatitis - Primary    L arm - (starting to heal)  Px triamcinolone cream 0.5% to apply up to tid Soap and water cleanse  Clean all clothes/tools that were in contact with plant  Antihistamine prn  If no imp-consider prednisone Disc s/s of bacterial infection to watch for  Update if not starting to improve in a week or if worsening   Handout given on poison ivy/oak dermatitis Will hire someone to get it out of yard        Other   Morbid obesity ([place])    Wt is down 4 lb Enc to keep up the good work with wt loss

## 2017-12-27 ENCOUNTER — Encounter: Payer: Self-pay | Admitting: Adult Health

## 2017-12-28 ENCOUNTER — Ambulatory Visit (INDEPENDENT_AMBULATORY_CARE_PROVIDER_SITE_OTHER): Payer: Managed Care, Other (non HMO) | Admitting: Adult Health

## 2017-12-28 ENCOUNTER — Encounter: Payer: Self-pay | Admitting: Adult Health

## 2017-12-28 VITALS — BP 136/82 | HR 69 | Ht 70.0 in | Wt 285.6 lb

## 2017-12-28 DIAGNOSIS — G4733 Obstructive sleep apnea (adult) (pediatric): Secondary | ICD-10-CM | POA: Diagnosis not present

## 2017-12-28 NOTE — Progress Notes (Signed)
@Patient  ID: Jeffrey Hartman, male    DOB: [birth year], 57 y.o.   MRN: [id number]  No chief complaint on file.   Referring provider: Abner Greenspan, MD  HPI: 57 year old male followed for obstructive sleep apnea  TEST   NPSG 2000: AHI 15/hr.    12/28/2017 Follow up : OSA  Patient returns for a one-year follow-up for sleep apnea.  Patient says he is doing very well on his CPAP machine.  Says that he wears it every single night and does not ever miss the night.  He feels rested with no significant daytime sleepiness.  Download shows excellent compliance with average usage around 8 to 9 hours.  Patient is on CPAP 11 cm H2O.  AHI 0.4.  Minimal leaks.   Allergies  Allergen Reactions  . Shellfish Allergy Shortness Of Breath and Other (See Comments)    Reaction=tunnel vision  . Amlodipine Besy-Benazepril Hcl     REACTION: Generic med did not adequatly control htnh  . Sertraline Hcl Nausea Only    REACTION: nausea, ED    Immunization History  Administered Date(s) Administered  . Influenza Split 06/07/2012, 07/02/2017  . Influenza Whole 06/02/2007, 04/28/2009, 04/23/2010, 05/03/2011  . Influenza, Seasonal, Injecte, Preservative Fre 05/28/2016  . Influenza,inj,Quad PF,6+ Mos 05/09/2013  . Influenza-Unspecified 06/01/2014, 06/21/2015  . Td 03/31/2003  . Tdap 12/29/2012  . Zoster 07/04/2015    Past Medical History:  Diagnosis Date  . Anxiety states   . Dermatophytosis of nail   . Obsessive-compulsive disorders   . Obstructive sleep apnea (adult) (pediatric)   . Other and unspecified hyperlipidemia   . Seasonal allergic rhinitis   . Unspecified dermatitis due to sun   . Unspecified essential hypertension   . Unspecified sleep apnea     Tobacco History: Social History   Tobacco Use  Smoking Status Never Smoker  Smokeless Tobacco Never Used   Counseling given: Not Answered   Outpatient Encounter Medications as of 12/28/2017  Medication Sig  . aspirin 81 MG tablet  Take 81 mg by mouth daily.    . Azelastine HCl (ASTEPRO) 0.15 % SOLN Place 1 spray into both nostrils 2 (two) times daily. (Patient taking differently: Place 1 spray into both nostrils 2 (two) times daily as needed. )  . clomiPHENE (CLOMID) 50 MG tablet Take 50 mg by mouth at bedtime.  Mariane Baumgarten Sodium 100 MG capsule Take 100 mg by mouth daily.  Marland Kitchen escitalopram (LEXAPRO) 20 MG tablet TAKE 1 TABLET DAILY  . Fexofenadine HCl (ALLEGRA PO) Take 1 tablet by mouth daily. As directed  . fish oil-omega-3 fatty acids 1000 MG capsule Take 1 g by mouth daily.   . fluticasone (FLONASE) 50 MCG/ACT nasal spray Place 2 sprays into both nostrils daily as needed.  . hydrocortisone (ANUSOL-HC) 25 MG suppository Place 1 suppository (25 mg total) rectally every evening. As needed for 7 days as needed for rectal pain or bleeding  . LOTREL 5-10 MG capsule Take 1 capsule by mouth daily.  . Melatonin 5 MG TABS Take 10 mg by mouth at bedtime.  . Multiple Vitamin (MULTIVITAMIN) capsule Take 1 capsule by mouth daily.  . simvastatin (ZOCOR) 20 MG tablet Take 1 tablet (20 mg total) by mouth daily.  Marland Kitchen triamcinolone cream (KENALOG) 0.5 % Apply 1 application topically 3 (three) times daily. To rash/affected area   No facility-administered encounter medications on file as of 12/28/2017.      Review of Systems  Constitutional:   No  weight loss,  night sweats,  Fevers, chills, fatigue, or  lassitude.  HEENT:   No headaches,  Difficulty swallowing,  Tooth/dental problems, or  Sore throat,                No sneezing, itching, ear ache, nasal congestion, post nasal drip,   CV:  No chest pain,  Orthopnea, PND, swelling in lower extremities, anasarca, dizziness, palpitations, syncope.   GI  No heartburn, indigestion, abdominal pain, nausea, vomiting, diarrhea, change in bowel habits, loss of appetite, bloody stools.   Resp: No shortness of breath with exertion or at rest.  No excess mucus, no productive cough,  No  non-productive cough,  No coughing up of blood.  No change in color of mucus.  No wheezing.  No chest wall deformity  Skin: no rash or lesions.  GU: no dysuria, change in color of urine, no urgency or frequency.  No flank pain, no hematuria   MS:  No joint pain or swelling.  No decreased range of motion.  No back pain.    Physical Exam  BP 136/82 (BP Location: Left Arm, Cuff Size: Normal)   Pulse 69   Ht 5\' 10"  (1.778 m)   Wt 285 lb 9.6 oz (129.5 kg)   SpO2 96%   BMI 40.98 kg/m   GEN: A/Ox3; pleasant , NAD, well nourished    HEENT:  [place]/AT,  EACs-clear, TMs-wnl, NOSE-clear, THROAT-clear, no lesions, no postnasal drip or exudate noted. Class 2-3 MP airway   NECK:  Supple w/ fair ROM; no JVD; normal carotid impulses w/o bruits; no thyromegaly or nodules palpated; no lymphadenopathy.    RESP  Clear  P & A; w/o, wheezes/ rales/ or rhonchi. no accessory muscle use, no dullness to percussion  CARD:  RRR, no m/r/g, no peripheral edema, pulses intact, no cyanosis or clubbing.  GI:   Soft & nt; nml bowel sounds; no organomegaly or masses detected.   Musco: Warm bil, no deformities or joint swelling noted.   Neuro: alert, no focal deficits noted.    Skin: Warm, no lesions or rashes    Lab Results:  CBC  BMET  BNP No results found for: BNP  ProBNP No results found for: PROBNP  Imaging: No results found.   Assessment & Plan:   Obstructive sleep apnea Excellent control on CPAP  Plan  Patient Instructions  Keep up the good work Continue on CPAP at bedtime Work on healthy weight Do not drive if sleepy Follow-up in 1 year with Dr. Elsworth Soho  and As needed       Morbid obesity ([place]) Wt loss      Rexene Edison, NP 12/28/2017

## 2017-12-28 NOTE — Patient Instructions (Addendum)
Keep up the good work Continue on CPAP at bedtime Work on healthy weight Do not drive if sleepy Follow-up in 1 year with Dr. Elsworth Soho  and As needed

## 2017-12-28 NOTE — Assessment & Plan Note (Signed)
Excellent control on CPAP  Plan  Patient Instructions  Keep up the good work Continue on CPAP at bedtime Work on healthy weight Do not drive if sleepy Follow-up in 1 year with Dr. Elsworth Soho  and As needed

## 2017-12-28 NOTE — Assessment & Plan Note (Signed)
Wt loss  

## 2018-03-22 ENCOUNTER — Other Ambulatory Visit: Payer: Managed Care, Other (non HMO)

## 2018-03-24 ENCOUNTER — Encounter: Payer: Self-pay | Admitting: Family Medicine

## 2018-03-26 ENCOUNTER — Encounter: Payer: Managed Care, Other (non HMO) | Admitting: Family Medicine

## 2018-04-07 ENCOUNTER — Telehealth (INDEPENDENT_AMBULATORY_CARE_PROVIDER_SITE_OTHER): Payer: Managed Care, Other (non HMO) | Admitting: Family Medicine

## 2018-04-07 ENCOUNTER — Other Ambulatory Visit (INDEPENDENT_AMBULATORY_CARE_PROVIDER_SITE_OTHER): Payer: Managed Care, Other (non HMO)

## 2018-04-07 DIAGNOSIS — E7800 Pure hypercholesterolemia, unspecified: Secondary | ICD-10-CM

## 2018-04-07 DIAGNOSIS — Z0000 Encounter for general adult medical examination without abnormal findings: Secondary | ICD-10-CM

## 2018-04-07 DIAGNOSIS — Z125 Encounter for screening for malignant neoplasm of prostate: Secondary | ICD-10-CM | POA: Diagnosis not present

## 2018-04-07 DIAGNOSIS — I10 Essential (primary) hypertension: Secondary | ICD-10-CM | POA: Diagnosis not present

## 2018-04-07 LAB — CBC WITH DIFFERENTIAL/PLATELET
BASOS ABS: 0 10*3/uL (ref 0.0–0.1)
Basophils Relative: 0.6 % (ref 0.0–3.0)
EOS PCT: 3.7 % (ref 0.0–5.0)
Eosinophils Absolute: 0.2 10*3/uL (ref 0.0–0.7)
HEMATOCRIT: 40.2 % (ref 39.0–52.0)
Hemoglobin: 13.4 g/dL (ref 13.0–17.0)
LYMPHS ABS: 1.5 10*3/uL (ref 0.7–4.0)
Lymphocytes Relative: 24.3 % (ref 12.0–46.0)
MCHC: 33.4 g/dL (ref 30.0–36.0)
MCV: 86.4 fl (ref 78.0–100.0)
MONOS PCT: 9.5 % (ref 3.0–12.0)
Monocytes Absolute: 0.6 10*3/uL (ref 0.1–1.0)
NEUTROS ABS: 3.9 10*3/uL (ref 1.4–7.7)
NEUTROS PCT: 61.9 % (ref 43.0–77.0)
Platelets: 216 10*3/uL (ref 150.0–400.0)
RBC: 4.66 Mil/uL (ref 4.22–5.81)
RDW: 14.6 % (ref 11.5–15.5)
WBC: 6.3 10*3/uL (ref 4.0–10.5)

## 2018-04-07 LAB — COMPREHENSIVE METABOLIC PANEL
ALK PHOS: 44 U/L (ref 39–117)
ALT: 25 U/L (ref 0–53)
AST: 19 U/L (ref 0–37)
Albumin: 4.1 g/dL (ref 3.5–5.2)
BILIRUBIN TOTAL: 0.4 mg/dL (ref 0.2–1.2)
BUN: 18 mg/dL (ref 6–23)
CALCIUM: 9.2 mg/dL (ref 8.4–10.5)
CO2: 31 mEq/L (ref 19–32)
Chloride: 104 mEq/L (ref 96–112)
Creatinine, Ser: 1.19 mg/dL (ref 0.40–1.50)
GFR: 67.4 mL/min (ref >60.00)
GLUCOSE: 94 mg/dL (ref 70–99)
POTASSIUM: 4.4 meq/L (ref 3.5–5.1)
Sodium: 140 mEq/L (ref 135–145)
TOTAL PROTEIN: 6.5 g/dL (ref 6.0–8.3)

## 2018-04-07 LAB — LIPID PANEL
Cholesterol: 146 mg/dL (ref 0–200)
HDL: 31.2 mg/dL — AB (ref >39.00)
LDL Cholesterol: 90 mg/dL (ref 0–99)
NONHDL: 115.03
Total CHOL/HDL Ratio: 5
Triglycerides: 126 mg/dL (ref 0.0–149.0)
VLDL: 25.2 mg/dL (ref 0.0–40.0)

## 2018-04-07 LAB — TSH: TSH: 1.45 u[IU]/mL (ref 0.35–4.50)

## 2018-04-07 LAB — PSA: PSA: 1.36 ng/mL (ref 0.10–4.00)

## 2018-04-07 NOTE — Telephone Encounter (Signed)
-----   Message from Ellamae Sia sent at 04/07/2018  7:38 AM EDT ----- Regarding: lab orders for now, thanks Patient is scheduled for CPX labs, please order future labs, Thanks , Karna Christmas

## 2018-04-12 ENCOUNTER — Encounter: Payer: Self-pay | Admitting: Family Medicine

## 2018-04-12 ENCOUNTER — Ambulatory Visit (INDEPENDENT_AMBULATORY_CARE_PROVIDER_SITE_OTHER): Payer: Managed Care, Other (non HMO) | Admitting: Family Medicine

## 2018-04-12 VITALS — BP 138/78 | HR 78 | Temp 98.9°F | Ht 70.0 in | Wt 296.5 lb

## 2018-04-12 DIAGNOSIS — I10 Essential (primary) hypertension: Secondary | ICD-10-CM | POA: Diagnosis not present

## 2018-04-12 DIAGNOSIS — Z125 Encounter for screening for malignant neoplasm of prostate: Secondary | ICD-10-CM

## 2018-04-12 DIAGNOSIS — D126 Benign neoplasm of colon, unspecified: Secondary | ICD-10-CM

## 2018-04-12 DIAGNOSIS — F429 Obsessive-compulsive disorder, unspecified: Secondary | ICD-10-CM

## 2018-04-12 DIAGNOSIS — Z0000 Encounter for general adult medical examination without abnormal findings: Secondary | ICD-10-CM

## 2018-04-12 DIAGNOSIS — E7800 Pure hypercholesterolemia, unspecified: Secondary | ICD-10-CM | POA: Diagnosis not present

## 2018-04-12 MED ORDER — SIMVASTATIN 20 MG PO TABS: 20.0000 mg | ORAL_TABLET | Freq: Every day | ORAL | 3 refills | Status: DC

## 2018-04-12 MED ORDER — LOTREL 5-10 MG PO CAPS: 1.0000 | ORAL_CAPSULE | Freq: Every day | ORAL | 3 refills | Status: DC

## 2018-04-12 MED ORDER — ESCITALOPRAM OXALATE 20 MG PO TABS: 20.0000 mg | ORAL_TABLET | Freq: Every day | ORAL | 3 refills | Status: DC

## 2018-04-12 NOTE — Assessment & Plan Note (Signed)
Reviewed colonoscopy 11/18  Will have 5 y recall

## 2018-04-12 NOTE — Assessment & Plan Note (Signed)
Reviewed health habits including diet and exercise and skin cancer prevention Reviewed appropriate screening tests for age  Also reviewed health mt list, fam hx and immunization status , as well as social and family history   See HPI Labs reviewed Wt gain discussed-made game plan for better diet with less fat and processed carbs Enc exercise /walking and bike  [employer] use and continues surveillance by dermatology

## 2018-04-12 NOTE — Assessment & Plan Note (Signed)
Continues lexapro for this  Well controlled and also mood is good  Reviewed stressors/ coping techniques/symptoms/ support sources/ tx options and side effects in detail today  Moving to a new house has also helped

## 2018-04-12 NOTE — Assessment & Plan Note (Signed)
bp is stable today  No cp or palpitations or headaches or edema  No side effects to medicines  BP Readings from Last 3 Encounters:  04/12/18 138/78  12/28/17 136/82  12/21/17 118/74     Continue DAW Lotrel

## 2018-04-12 NOTE — Assessment & Plan Note (Signed)
Discussed how this problem influences overall health and the risks it imposes  Reviewed plan for weight loss with lower calorie diet (via better food choices and also portion control or program like weight watchers) and exercise building up to or more than 30 minutes 5 days per week including some aerobic activity   Enc pt to get back to regular diet /no junk food/ less processed carbs

## 2018-04-12 NOTE — Patient Instructions (Addendum)
Get the junk food out of the house  Try to get most of your carbohydrates from produce (with the exception of white potatoes)  Eat less bread/pasta/rice/snack foods/cereals/sweets and other items from the middle of the grocery store (processed carbs)   Take care of yourself  Use sunscreen  Keep walking for exercise-add the bike if you want to

## 2018-04-12 NOTE — Assessment & Plan Note (Signed)
Lab Results  Component Value Date   PSA 1.36 04/07/2018   PSA 1.50 03/16/2017   PSA 0.89 01/31/2016   No symptoms  Continue urology f/u - for hypogonadism as well

## 2018-04-12 NOTE — Progress Notes (Signed)
Subjective:    Patient ID: Jeffrey Hartman, male    DOB: [birth year], 57 y.o.   MRN: [id number]  HPI Here for health maintenance exam and to review chronic medical problems   Very busy at work- understaffed for the past 2 months  Extra work often   Visited with nephew this summer -that was fun   Wt Readings from Last 3 Encounters:  04/12/18 296 lb 8 oz (134.5 kg)  12/28/17 285 lb 9.6 oz (129.5 kg)  12/21/17 290 lb 12 oz (131.9 kg)  not eating well - his nephew is underweight -had to have food for him in the house  This will change  Exercise- still walking regularly and plans on getting a recumbent bike  42.54 kg/m   Flu vaccine -gets them in the fall   Colonoscopy 11/18 -adenoma - 5 year recall  Mother had colon cancer at 40  Tetanus shot 4/14  Prostate health Lab Results  Component Value Date   PSA 1.36 04/07/2018   PSA 1.50 03/16/2017   PSA 0.89 01/31/2016   hx of hypogonadism  Sees urology -doing well on clomid    zostavax 11/16  bp is stable today  No cp or palpitations or headaches or edema  No side effects to medicines  BP Readings from Last 3 Encounters:  04/12/18 138/78  12/28/17 136/82  12/21/17 118/74      OCD- symptoms come and go/ better with new house and better routine  (compulsive checking)  Mood- very good  lexapro- does well for him (does cause some ED)   Hyperlipidemia Lab Results  Component Value Date   CHOL 146 04/07/2018   CHOL 148 03/16/2017   CHOL 171 01/31/2016   Lab Results  Component Value Date   HDL 31.20 (L) 04/07/2018   HDL 32.80 (L) 03/16/2017   HDL 36.50 (L) 01/31/2016   Lab Results  Component Value Date   LDLCALC 90 04/07/2018   LDLCALC 89 03/16/2017   LDLCALC 107 (H) 01/31/2016   Lab Results  Component Value Date   TRIG 126.0 04/07/2018   TRIG 128.0 03/16/2017   TRIG 136.0 01/31/2016   Lab Results  Component Value Date   CHOLHDL 5 04/07/2018   CHOLHDL 4 03/16/2017   CHOLHDL 5 01/31/2016   Lab  Results  Component Value Date   LDLDIRECT 109.0 09/21/2009  zocor and diet  HDL remains low  Taking fish oil (krill) regularly  Walks- week days M-F      Other labs Results for orders placed or performed in visit on 04/07/18  CBC with Differential/Platelet  Result Value Ref Range   WBC 6.3 4.0 - 10.5 K/uL   RBC 4.66 4.22 - 5.81 Mil/uL   Hemoglobin 13.4 13.0 - 17.0 g/dL   HCT 40.2 39.0 - 52.0 %   MCV 86.4 78.0 - 100.0 fl   MCHC 33.4 30.0 - 36.0 g/dL   RDW 14.6 11.5 - 15.5 %   Platelets 216.0 150.0 - 400.0 K/uL   Neutrophils Relative % 61.9 43.0 - 77.0 %   Lymphocytes Relative 24.3 12.0 - 46.0 %   Monocytes Relative 9.5 3.0 - 12.0 %   Eosinophils Relative 3.7 0.0 - 5.0 %   Basophils Relative 0.6 0.0 - 3.0 %   Neutro Abs 3.9 1.4 - 7.7 K/uL   Lymphs Abs 1.5 0.7 - 4.0 K/uL   Monocytes Absolute 0.6 0.1 - 1.0 K/uL   Eosinophils Absolute 0.2 0.0 - 0.7 K/uL   Basophils Absolute 0.0  0.0 - 0.1 K/uL  Comprehensive metabolic panel  Result Value Ref Range   Sodium 140 135 - 145 mEq/L   Potassium 4.4 3.5 - 5.1 mEq/L   Chloride 104 96 - 112 mEq/L   CO2 31 19 - 32 mEq/L   Glucose, Bld 94 70 - 99 mg/dL   BUN 18 6 - 23 mg/dL   Creatinine, Ser 1.19 0.40 - 1.50 mg/dL   Total Bilirubin 0.4 0.2 - 1.2 mg/dL   Alkaline Phosphatase 44 39 - 117 U/L   AST 19 0 - 37 U/L   ALT 25 0 - 53 U/L   Total Protein 6.5 6.0 - 8.3 g/dL   Albumin 4.1 3.5 - 5.2 g/dL   Calcium 9.2 8.4 - 10.5 mg/dL   GFR 67.40 >60.00 mL/min  Lipid panel  Result Value Ref Range   Cholesterol 146 0 - 200 mg/dL   Triglycerides 126.0 0.0 - 149.0 mg/dL   HDL 31.20 (L) >39.00 mg/dL   VLDL 25.2 0.0 - 40.0 mg/dL   LDL Cholesterol 90 0 - 99 mg/dL   Total CHOL/HDL Ratio 5    NonHDL 115.03   TSH  Result Value Ref Range   TSH 1.45 0.35 - 4.50 uIU/mL  PSA  Result Value Ref Range   PSA 1.36 0.10 - 4.00 ng/mL    Patient Active Problem List   Diagnosis Date Noted  . Plant dermatitis 12/21/2017  . Eczema 08/24/2015  . Cerumen  impaction 06/16/2014  . Low testosterone 11/09/2013  . Prostate cancer screening 11/02/2013  . Adenomatous colon polyp 07/28/2012  . Family history of malignant neoplasm of gastrointestinal tract 06/29/2012  . Anal fissure 06/29/2012  . Personal history of colonic polyps 06/29/2012  . Routine general medical examination at a health care facility 09/17/2011  . Morbid obesity ([place]) 01/01/2010  . Allergic rhinitis 10/13/2008  . Obstructive sleep apnea 08/11/2007  . Obsessive-compulsive disorder 01/21/2007  . ACTINIC SKIN DAMAGE 01/21/2007  . ONYCHOMYCOSIS 01/13/2007  . Hyperlipidemia 01/13/2007  . Essential hypertension 01/13/2007   Past Medical History:  Diagnosis Date  . Anxiety states   . Dermatophytosis of nail   . Obsessive-compulsive disorders   . Obstructive sleep apnea (adult) (pediatric)   . Other and unspecified hyperlipidemia   . Seasonal allergic rhinitis   . Unspecified dermatitis due to sun   . Unspecified essential hypertension   . Unspecified sleep apnea    Past Surgical History:  Procedure Laterality Date  . COLONOSCOPY  11/02, 11/05   polyps  . CYSTECTOMY     cyst on hand   Social History   Tobacco Use  . Smoking status: Never Smoker  . Smokeless tobacco: Never Used  Substance Use Topics  . Alcohol use: Yes    Alcohol/week: 0.0 standard drinks    Comment: occasionally,1 beer once month  . Drug use: No   Family History  Problem Relation Age of Onset  . Hypertension Father   . Diabetes type II Father   . Cancer - Other Father 78       Adrenal Cancer  . Colon cancer Mother 78  . Prostate cancer Neg Hx    Allergies  Allergen Reactions  . Shellfish Allergy Shortness Of Breath and Other (See Comments)    Reaction=tunnel vision  . Amlodipine Besy-Benazepril Hcl     REACTION: Generic med did not adequatly control htnh  . Sertraline Hcl Nausea Only    REACTION: nausea, ED   Current Outpatient Medications on File Prior to Visit  Medication Sig  Dispense Refill  . aspirin 81 MG tablet Take 81 mg by mouth daily.      . Azelastine HCl (ASTEPRO) 0.15 % SOLN Place 1 spray into both nostrils 2 (two) times daily. (Patient taking differently: Place 1 spray into both nostrils 2 (two) times daily as needed. ) 90 mL 3  . clomiPHENE (CLOMID) 50 MG tablet Take 50 mg by mouth at bedtime.    Mariane Baumgarten Sodium 100 MG capsule Take 100 mg by mouth daily.    Marland Kitchen Fexofenadine HCl (ALLEGRA PO) Take 1 tablet by mouth daily. As directed    . fish oil-omega-3 fatty acids 1000 MG capsule Take 1 g by mouth daily.     . fluticasone (FLONASE) 50 MCG/ACT nasal spray Place 2 sprays into both nostrils daily as needed. 48 g 3  . hydrocortisone (ANUSOL-HC) 25 MG suppository Place 1 suppository (25 mg total) rectally every evening. As needed for 7 days as needed for rectal pain or bleeding 14 suppository 2  . Melatonin 5 MG TABS Take 10 mg by mouth at bedtime.    . Multiple Vitamin (MULTIVITAMIN) capsule Take 1 capsule by mouth daily.    Marland Kitchen triamcinolone cream (KENALOG) 0.5 % Apply 1 application topically 3 (three) times daily. To rash/affected area 30 g 0   No current facility-administered medications on file prior to visit.      Review of Systems  Constitutional: Positive for fatigue. Negative for activity change, appetite change, fever and unexpected weight change.       Occ fatigue from work schedule   HENT: Negative for congestion, rhinorrhea, sore throat and trouble swallowing.   Eyes: Negative for pain, redness, itching and visual disturbance.  Respiratory: Negative for cough, chest tightness, shortness of breath and wheezing.   Cardiovascular: Negative for chest pain and palpitations.  Gastrointestinal: Negative for abdominal pain, blood in stool, constipation, diarrhea and nausea.  Endocrine: Negative for cold intolerance, heat intolerance, polydipsia and polyuria.  Genitourinary: Negative for difficulty urinating, dysuria, frequency and urgency.    Musculoskeletal: Negative for arthralgias, joint swelling and myalgias.  Skin: Negative for pallor and rash.  Neurological: Negative for dizziness, tremors, weakness, numbness and headaches.  Hematological: Negative for adenopathy. Does not bruise/bleed easily.  Psychiatric/Behavioral: Negative for decreased concentration and dysphoric mood. The patient is not nervous/anxious.        Objective:   Physical Exam  Constitutional: He appears well-developed and well-nourished. No distress.  Morbidly obese and well appearing  HENT:  Head: Normocephalic and atraumatic.  Right Ear: External ear normal.  Left Ear: External ear normal.  Nose: Nose normal.  Mouth/Throat: Oropharynx is clear and moist.  Partial cerumen impaction bilat   Eyes: Pupils are equal, round, and reactive to light. Conjunctivae and EOM are normal. Right eye exhibits no discharge. Left eye exhibits no discharge. No scleral icterus.  Neck: Normal range of motion. Neck supple. No JVD present. Carotid bruit is not present. No thyromegaly present.  Cardiovascular: Normal rate, regular rhythm, normal heart sounds and intact distal pulses. Exam reveals no gallop.  Pulmonary/Chest: Effort normal and breath sounds normal. No respiratory distress. He has no wheezes. He exhibits no tenderness.  Abdominal: Soft. Bowel sounds are normal. He exhibits no distension, no abdominal bruit and no mass. There is no tenderness.  Musculoskeletal: Normal range of motion. He exhibits no edema, tenderness or deformity.  Lymphadenopathy:    He has no cervical adenopathy.  Neurological: He is alert. He has normal reflexes. No cranial  nerve deficit. He exhibits normal muscle tone. Coordination normal.  Skin: Skin is warm and dry. No rash noted. No erythema. No pallor.  Solar lentigines diffusely Some solar aging  Scattered SKs   Psychiatric: He has a normal mood and affect.          Assessment & Plan:   Problem List Items Addressed This  Visit      Cardiovascular and Mediastinum   Essential hypertension    bp is stable today  No cp or palpitations or headaches or edema  No side effects to medicines  BP Readings from Last 3 Encounters:  04/12/18 138/78  12/28/17 136/82  12/21/17 118/74     Continue DAW Lotrel       Relevant Medications   LOTREL 5-10 MG capsule   simvastatin (ZOCOR) 20 MG tablet     Digestive   Adenomatous colon polyp    Reviewed colonoscopy 11/18  Will have 5 y recall        Other   Hyperlipidemia    Disc goals for lipids and reasons to control them Rev last labs with pt Rev low sat fat diet in detail  HDL remains low-enc to continue fish oil/omega 3 and exercise  zocor and low fat diet       Relevant Medications   LOTREL 5-10 MG capsule   simvastatin (ZOCOR) 20 MG tablet   Morbid obesity (HCC)    Discussed how this problem influences overall health and the risks it imposes  Reviewed plan for weight loss with lower calorie diet (via better food choices and also portion control or program like weight watchers) and exercise building up to or more than 30 minutes 5 days per week including some aerobic activity   Enc pt to get back to regular diet /no junk food/ less processed carbs       Obsessive-compulsive disorder    Continues lexapro for this  Well controlled and also mood is good  Reviewed stressors/ coping techniques/symptoms/ support sources/ tx options and side effects in detail today  Moving to a new house has also helped       Relevant Medications   escitalopram (LEXAPRO) 20 MG tablet   Prostate cancer screening    Lab Results  Component Value Date   PSA 1.36 04/07/2018   PSA 1.50 03/16/2017   PSA 0.89 01/31/2016   No symptoms  Continue urology f/u - for hypogonadism as well       Routine general medical examination at a health care facility - Primary    Reviewed health habits including diet and exercise and skin cancer prevention Reviewed appropriate screening  tests for age  Also reviewed health mt list, fam hx and immunization status , as well as social and family history   See HPI Labs reviewed Wt gain discussed-made game plan for better diet with less fat and processed carbs Enc exercise /walking and bike  [employer] use and continues surveillance by dermatology

## 2018-04-12 NOTE — Assessment & Plan Note (Signed)
Disc goals for lipids and reasons to control them Rev last labs with pt Rev low sat fat diet in detail  HDL remains low-enc to continue fish oil/omega 3 and exercise  zocor and low fat diet

## 2018-04-13 ENCOUNTER — Encounter: Payer: Self-pay | Admitting: Family Medicine

## 2018-04-14 MED ORDER — FLUTICASONE PROPIONATE 50 MCG/ACT NA SUSP: 2.0000 | Freq: Every day | NASAL | 3 refills | Status: DC | PRN

## 2018-04-14 MED ORDER — SIMVASTATIN 20 MG PO TABS: 20.0000 mg | ORAL_TABLET | Freq: Every day | ORAL | 3 refills | Status: DC

## 2018-04-14 MED ORDER — LOTREL 5-10 MG PO CAPS: 1.0000 | ORAL_CAPSULE | Freq: Every day | ORAL | 3 refills | Status: DC

## 2018-04-14 MED ORDER — ESCITALOPRAM OXALATE 20 MG PO TABS: 20.0000 mg | ORAL_TABLET | Freq: Every day | ORAL | 3 refills | Status: DC

## 2018-04-14 NOTE — Telephone Encounter (Signed)
Rxs resent to [facility]

## 2018-05-25 ENCOUNTER — Encounter: Payer: Self-pay | Admitting: Family Medicine

## 2018-08-18 ENCOUNTER — Encounter: Payer: Self-pay | Admitting: Family Medicine

## 2018-08-18 ENCOUNTER — Ambulatory Visit (INDEPENDENT_AMBULATORY_CARE_PROVIDER_SITE_OTHER): Payer: Managed Care, Other (non HMO) | Admitting: Family Medicine

## 2018-08-18 VITALS — BP 130/90 | HR 97 | Temp 98.5°F | Ht 70.0 in | Wt 296.0 lb

## 2018-08-18 DIAGNOSIS — J019 Acute sinusitis, unspecified: Secondary | ICD-10-CM | POA: Insufficient documentation

## 2018-08-18 DIAGNOSIS — J0110 Acute frontal sinusitis, unspecified: Secondary | ICD-10-CM | POA: Diagnosis not present

## 2018-08-18 MED ORDER — BENZONATATE 200 MG PO CAPS: 200.0000 mg | ORAL_CAPSULE | Freq: Three times a day (TID) | ORAL | 1 refills | Status: DC | PRN

## 2018-08-18 MED ORDER — AMOXICILLIN-POT CLAVULANATE 875-125 MG PO TABS: 1.0000 | ORAL_TABLET | Freq: Two times a day (BID) | ORAL | 0 refills | Status: DC

## 2018-08-18 MED ORDER — PROMETHAZINE-DM 6.25-15 MG/5ML PO SYRP: 5.0000 mL | ORAL_SOLUTION | Freq: Every evening | ORAL | 0 refills | Status: DC | PRN

## 2018-08-18 NOTE — Assessment & Plan Note (Signed)
With viral uri Focal sinus pain and tenderness over R eye (no rash or skin change) Cover with augmentin  Tessalon and prometh-dm for cough  Symptomatic care for congestion (recommend d/c pseudoephedrine in light of elevated bp and sleep problems)  Nasal saline/flonase/nsaid Update if not starting to improve in a week or if worsening    Meds ordered this encounter  Medications  . amoxicillin-clavulanate (AUGMENTIN) 875-125 MG tablet    Sig: Take 1 tablet by mouth 2 (two) times daily.    Dispense:  14 tablet    Refill:  0  . benzonatate (TESSALON) 200 MG capsule    Sig: Take 1 capsule (200 mg total) by mouth 3 (three) times daily as needed. Do not bite pill    Dispense:  30 capsule    Refill:  1  . promethazine-dextromethorphan (PROMETHAZINE-DM) 6.25-15 MG/5ML syrup    Sig: Take 5 mLs by mouth at bedtime as needed for cough. Caution of sedation    Dispense:  118 mL    Refill:  0

## 2018-08-18 NOTE — Progress Notes (Signed)
Subjective:    Patient ID: Jeffrey Hartman, male    DOB: 18-Aug-1961, 57 y.o.   MRN: [id number]  HPI Here for sinus symptoms   Started 6 d ago  Rhinorrhea with clear d/c- then congestion  Now yellow drainage  Sinus pain and pressure -above R eye -- not feeling like a cold  Sneezing and still runny nose  Coughing -keeping him up at night  Can feel congestion- swallows it (no wheeze or sob)  No rash    otc :  dayquil nyquil Robitussin  mucinex D  BP: 130/90  Took pseudoephedrine   On med list  flonase- using  astepro-using  Allegra - using   No fever    Wt Readings from Last 3 Encounters:  08/18/18 296 lb (134.3 kg)  04/12/18 296 lb 8 oz (134.5 kg)  12/28/17 285 lb 9.6 oz (129.5 kg)    Patient Active Problem List   Diagnosis Date Noted  . Acute sinusitis 08/18/2018  . Eczema 08/24/2015  . Cerumen impaction 06/16/2014  . Low testosterone 11/09/2013  . Prostate cancer screening 11/02/2013  . Adenomatous colon polyp 07/28/2012  . Family history of malignant neoplasm of gastrointestinal tract 06/29/2012  . Anal fissure 06/29/2012  . Personal history of colonic polyps 06/29/2012  . Routine general medical examination at a health care facility 09/17/2011  . Morbid obesity ([place]) 01/01/2010  . Allergic rhinitis 10/13/2008  . Obstructive sleep apnea 08/11/2007  . Obsessive-compulsive disorder 01/21/2007  . ACTINIC SKIN DAMAGE 01/21/2007  . Hyperlipidemia 01/13/2007  . Essential hypertension 01/13/2007   Past Medical History:  Diagnosis Date  . Anxiety states   . Dermatophytosis of nail   . Obsessive-compulsive disorders   . Obstructive sleep apnea (adult) (pediatric)   . Other and unspecified hyperlipidemia   . Seasonal allergic rhinitis   . Unspecified dermatitis due to sun   . Unspecified essential hypertension   . Unspecified sleep apnea    Past Surgical History:  Procedure Laterality Date  . COLONOSCOPY  11/02, 11/05   polyps  . CYSTECTOMY     cyst on hand   Social History   Tobacco Use  . Smoking status: Never Smoker  . Smokeless tobacco: Never Used  Substance Use Topics  . Alcohol use: Yes    Alcohol/week: 0.0 standard drinks    Comment: occasionally,1 beer once month  . Drug use: No   Family History  Problem Relation Age of Onset  . Hypertension Father   . Diabetes type II Father   . Cancer - Other Father 67       Adrenal Cancer  . Colon cancer Mother 32  . Prostate cancer Neg Hx    Allergies  Allergen Reactions  . Shellfish Allergy Shortness Of Breath and Other (See Comments)    Reaction=tunnel vision  . Amlodipine Besy-Benazepril Hcl     REACTION: Generic med did not adequatly control htnh  . Sertraline Hcl Nausea Only    REACTION: nausea, ED   Current Outpatient Medications on File Prior to Visit  Medication Sig Dispense Refill  . aspirin 81 MG tablet Take 81 mg by mouth daily.      . Azelastine HCl (ASTEPRO) 0.15 % SOLN Place 1 spray into both nostrils 2 (two) times daily. (Patient taking differently: Place 1 spray into both nostrils 2 (two) times daily as needed. ) 90 mL 3  . clomiPHENE (CLOMID) 50 MG tablet Take 50 mg by mouth at bedtime.    . Docusate Sodium 100  MG capsule Take 100 mg by mouth daily.    Marland Kitchen escitalopram (LEXAPRO) 20 MG tablet Take 1 tablet (20 mg total) by mouth daily. 90 tablet 3  . Fexofenadine HCl (ALLEGRA PO) Take 1 tablet by mouth daily. As directed    . fish oil-omega-3 fatty acids 1000 MG capsule Take 1 g by mouth daily.     . fluticasone (FLONASE) 50 MCG/ACT nasal spray Place 2 sprays into both nostrils daily as needed. 48 g 3  . hydrocortisone (ANUSOL-HC) 25 MG suppository Place 1 suppository (25 mg total) rectally every evening. As needed for 7 days as needed for rectal pain or bleeding 14 suppository 2  . LOTREL 5-10 MG capsule Take 1 capsule by mouth daily. 90 capsule 3  . Melatonin 5 MG TABS Take 10 mg by mouth at bedtime.    . Multiple Vitamin (MULTIVITAMIN) capsule Take 1  capsule by mouth daily.    . simvastatin (ZOCOR) 20 MG tablet Take 1 tablet (20 mg total) by mouth daily. 90 tablet 3  . triamcinolone cream (KENALOG) 0.5 % Apply 1 application topically 3 (three) times daily. To rash/affected area 30 g 0   No current facility-administered medications on file prior to visit.     Review of Systems  Constitutional: Positive for appetite change. Negative for fatigue and fever.  HENT: Positive for congestion, ear pain, postnasal drip, rhinorrhea, sinus pressure and sore throat. Negative for nosebleeds.   Eyes: Negative for pain, redness and itching.  Respiratory: Positive for cough. Negative for shortness of breath and wheezing.   Cardiovascular: Negative for chest pain.  Gastrointestinal: Negative for abdominal pain, diarrhea, nausea and vomiting.  Endocrine: Negative for polyuria.  Genitourinary: Negative for dysuria, frequency and urgency.  Musculoskeletal: Negative for arthralgias and myalgias.  Allergic/Immunologic: Negative for immunocompromised state.  Neurological: Positive for headaches. Negative for dizziness, tremors, syncope, weakness and numbness.  Hematological: Negative for adenopathy. Does not bruise/bleed easily.  Psychiatric/Behavioral: Negative for dysphoric mood. The patient is not nervous/anxious.        Objective:   Physical Exam Constitutional:      General: He is not in acute distress.    Appearance: Normal appearance. He is well-developed. He is obese.  HENT:     Head: Normocephalic and atraumatic.     Comments: Nares are injected and congested   Focal R frontal sinus tenderness w/o skin change  Clear pnd    Right Ear: Tympanic membrane, ear canal and external ear normal.     Left Ear: Tympanic membrane, ear canal and external ear normal.     Nose: Congestion and rhinorrhea present.     Mouth/Throat:     Mouth: Mucous membranes are moist.     Pharynx: No oropharyngeal exudate or posterior oropharyngeal erythema.  Eyes:      General: No scleral icterus.       Right eye: No discharge.        Left eye: No discharge.     Conjunctiva/sclera: Conjunctivae normal.     Pupils: Pupils are equal, round, and reactive to light.  Neck:     Musculoskeletal: Normal range of motion and neck supple.  Cardiovascular:     Rate and Rhythm: Regular rhythm. Tachycardia present.  Pulmonary:     Effort: Pulmonary effort is normal. No respiratory distress.     Breath sounds: Normal breath sounds. No stridor. No wheezing, rhonchi or rales.     Comments: Good air exch No rales or rhonchi No wheeze even on  forced exp Chest:     Chest wall: No tenderness.  Musculoskeletal: Normal range of motion.  Lymphadenopathy:     Cervical: No cervical adenopathy.  Skin:    General: Skin is warm and dry.     Findings: No erythema or rash.  Neurological:     Mental Status: He is alert.     Cranial Nerves: No cranial nerve deficit.  Psychiatric:        Mood and Affect: Mood normal.           Assessment & Plan:   Problem List Items Addressed This Visit      Respiratory   Acute sinusitis - Primary    With viral uri Focal sinus pain and tenderness over R eye (no rash or skin change) Cover with augmentin  Tessalon and prometh-dm for cough  Symptomatic care for congestion (recommend d/c pseudoephedrine in light of elevated bp and sleep problems)  Nasal saline/flonase/nsaid Update if not starting to improve in a week or if worsening    Meds ordered this encounter  Medications  . amoxicillin-clavulanate (AUGMENTIN) 875-125 MG tablet    Sig: Take 1 tablet by mouth 2 (two) times daily.    Dispense:  14 tablet    Refill:  0  . benzonatate (TESSALON) 200 MG capsule    Sig: Take 1 capsule (200 mg total) by mouth 3 (three) times daily as needed. Do not bite pill    Dispense:  30 capsule    Refill:  1  . promethazine-dextromethorphan (PROMETHAZINE-DM) 6.25-15 MG/5ML syrup    Sig: Take 5 mLs by mouth at bedtime as needed for cough.  Caution of sedation    Dispense:  118 mL    Refill:  0         Relevant Medications   amoxicillin-clavulanate (AUGMENTIN) 875-125 MG tablet   benzonatate (TESSALON) 200 MG capsule   promethazine-dextromethorphan (PROMETHAZINE-DM) 6.25-15 MG/5ML syrup

## 2018-08-18 NOTE — Patient Instructions (Signed)
I think you have a cold and a sinus infection   Continue the prescription medications  Also try nasal saline spray  nsaid like advil (ibuprofen) for congestion and sinus pain   Warm compress on face  Breathe steam   Tessalon  prometh DM- cough/ pm- caution of sedation   Take augmentin as directed   Update if not starting to improve in a week or if worsening

## 2018-11-08 ENCOUNTER — Encounter: Payer: Self-pay | Admitting: Family Medicine

## 2018-11-24 ENCOUNTER — Other Ambulatory Visit: Payer: Self-pay | Admitting: Family Medicine

## 2018-11-28 ENCOUNTER — Telehealth: Payer: Managed Care, Other (non HMO) | Admitting: Nurse Practitioner

## 2018-11-28 DIAGNOSIS — J0100 Acute maxillary sinusitis, unspecified: Secondary | ICD-10-CM | POA: Diagnosis not present

## 2018-11-28 MED ORDER — AMOXICILLIN-POT CLAVULANATE 875-125 MG PO TABS: 1.0000 | ORAL_TABLET | Freq: Two times a day (BID) | ORAL | 0 refills | Status: DC

## 2018-11-28 NOTE — Progress Notes (Signed)
We are sorry that you are not feeling well.  Here is how we plan to help!  Based on what you have shared with me it looks like you have sinusitis.  Sinusitis is inflammation and infection in the sinus cavities of the head.  Based on your presentation I believe you most likely have Acute Bacterial Sinusitis.  This is an infection caused by bacteria and is treated with antibiotics. I have prescribed Augmentin 875mg/125mg one tablet twice daily with food, for 7 days. You may use an oral decongestant such as Mucinex D or if you have glaucoma or high blood pressure use plain Mucinex. Saline nasal spray help and can safely be used as often as needed for congestion.  If you develop worsening sinus pain, fever or notice severe headache and vision changes, or if symptoms are not better after completion of antibiotic, please schedule an appointment with a health care provider.    Sinus infections are not as easily transmitted as other respiratory infection, however we still recommend that you avoid close contact with loved ones, especially the very young and elderly.  Remember to wash your hands thoroughly throughout the day as this is the number one way to prevent the spread of infection!  Home Care:  Only take medications as instructed by your medical team.  Complete the entire course of an antibiotic.  Do not take these medications with alcohol.  A steam or ultrasonic humidifier can help congestion.  You can place a towel over your head and breathe in the steam from hot water coming from a faucet.  Avoid close contacts especially the very young and the elderly.  Cover your mouth when you cough or sneeze.  Always remember to wash your hands.  Get Help Right Away If:  You develop worsening fever or sinus pain.  You develop a severe head ache or visual changes.  Your symptoms persist after you have completed your treatment plan.  Make sure you  Understand these instructions.  Will watch your  condition.  Will get help right away if you are not doing well or get worse.  Your e-visit answers were reviewed by a board certified advanced clinical practitioner to complete your personal care plan.  Depending on the condition, your plan could have included both over the counter or prescription medications.  If there is a problem please reply  once you have received a response from your provider.  Your safety is important to us.  If you have drug allergies check your prescription carefully.    You can use MyChart to ask questions about today's visit, request a non-urgent call back, or ask for a work or school excuse for 24 hours related to this e-Visit. If it has been greater than 24 hours you will need to follow up with your provider, or enter a new e-Visit to address those concerns.  You will get an e-mail in the next two days asking about your experience.  I hope that your e-visit has been valuable and will speed your recovery. Thank you for using e-visits.   5 minutes spent reviewing and documenting in chart.  

## 2018-11-28 NOTE — Progress Notes (Deleted)
We are sorry that you are not feeling well.  Here is how we plan to help!  Based on your presentation I believe you most likely have {Evisit cough conditions:21012032}    In addition you may use {Evisit cough meds 2:21012031}  {EVISITCOUGHPREDNISONE:140100125}  From your responses in the eVisit questionnaire you describe inflammation in the upper respiratory tract which is causing a significant cough.  This is commonly called Bronchitis and has four common causes:    Allergies  Viral Infections  Acid Reflux  Bacterial Infection Allergies, viruses and acid reflux are treated by controlling symptoms or eliminating the cause. An example might be a cough caused by taking certain blood pressure medications. You stop the cough by changing the medication. Another example might be a cough caused by acid reflux. Controlling the reflux helps control the cough.  USE OF BRONCHODILATOR ("RESCUE") INHALERS: There is a risk from using your bronchodilator too frequently.  The risk is that over-reliance on a medication which only relaxes the muscles surrounding the breathing tubes can reduce the effectiveness of medications prescribed to reduce swelling and congestion of the tubes themselves.  Although you feel brief relief from the bronchodilator inhaler, your asthma may actually be worsening with the tubes becoming more swollen and filled with mucus.  This can delay other crucial treatments, such as oral steroid medications. If you need to use a bronchodilator inhaler daily, several times per day, you should discuss this with your provider.  There are probably better treatments that could be used to keep your asthma under control.     HOME CARE . Only take medications as instructed by your medical team. . Complete the entire course of an antibiotic. . Drink plenty of fluids and get plenty of rest. . Avoid close contacts especially the very young and the elderly . Cover your mouth if you cough or cough  into your sleeve. . Always remember to wash your hands . A steam or ultrasonic humidifier can help congestion.   GET HELP RIGHT AWAY IF: . You develop worsening fever. . You become short of breath . You cough up blood. . Your symptoms persist after you have completed your treatment plan MAKE SURE YOU   Understand these instructions.  Will watch your condition.  Will get help right away if you are not doing well or get worse.  Your e-visit answers were reviewed by a board certified advanced clinical practitioner to complete your personal care plan.  Depending on the condition, your plan could have included both over the counter or prescription medications. If there is a problem please reply  once you have received a response from your provider. Your safety is important to [place].  If you have drug allergies check your prescription carefully.    You can use MyChart to ask questions about today's visit, request a non-urgent call back, or ask for a work or school excuse for 24 hours related to this e-Visit. If it has been greater than 24 hours you will need to follow up with your provider, or enter a new e-Visit to address those concerns. You will get an e-mail in the next two days asking about your experience.  I hope that your e-visit has been valuable and will speed your recovery. Thank you for using e-visits.

## 2018-12-02 ENCOUNTER — Other Ambulatory Visit: Payer: Self-pay

## 2018-12-02 ENCOUNTER — Encounter: Payer: Self-pay | Admitting: Nurse Practitioner

## 2018-12-02 ENCOUNTER — Telehealth: Payer: Managed Care, Other (non HMO) | Admitting: Family

## 2018-12-02 ENCOUNTER — Ambulatory Visit (INDEPENDENT_AMBULATORY_CARE_PROVIDER_SITE_OTHER): Payer: Managed Care, Other (non HMO) | Admitting: Nurse Practitioner

## 2018-12-02 DIAGNOSIS — G4733 Obstructive sleep apnea (adult) (pediatric): Secondary | ICD-10-CM | POA: Diagnosis not present

## 2018-12-02 DIAGNOSIS — R059 Cough, unspecified: Secondary | ICD-10-CM

## 2018-12-02 DIAGNOSIS — R05 Cough: Secondary | ICD-10-CM

## 2018-12-02 MED ORDER — PREDNISONE 10 MG (21) PO TBPK: ORAL_TABLET | ORAL | 0 refills | Status: DC

## 2018-12-02 NOTE — Patient Instructions (Signed)
Patient continues to benefit from CPAP with good compliance and control documented Continue CPAP at current settings Continue current medications Goal of 4 hours or more usage per night Maintain healthy weight Do not drive if drowsy Follow up with Dr. Elsworth Soho in 1 year or sooner if needed

## 2018-12-02 NOTE — Progress Notes (Signed)
Virtual Visit via Telephone Note  I connected with [name] on 12/02/18 at  2:00 PM EDT by telephone and verified that I am speaking with the correct person using two identifiers.   I discussed the limitations, risks, security and privacy concerns of performing an evaluation and management service by telephone and the availability of in person appointments. I also discussed with the patient that there may be a patient responsible charge related to this service. The patient expressed understanding and agreed to proceed.   History of Present Illness: 58 year old male with obstructive sleep apnea followed by Dr. Elsworth Soho.  Patient has a tele-visit today for one-year follow-up on OSA.  Patient states that this is been a stable interval for him.  He is doing well and wears a CPAP each night.  He states that he does benefit from CPAP and is much less drowsy during the day after using it at night.  He denies any issues with his mask. Denies f/c/s, n/v/d, hemoptysis, PND, leg swelling.  Observations/Objective:  NPSG 2000: AHI 15/hr  Compliance report 11/28/17 - 12/27/17: usage days 30/30 (100%), average usage 8 hours 44 minutes, CPAP set pressure 11 cmH20, AHI:0.4  Assessment and Plan: Discussion: Patient states that this is been a stable interval for him.  He is doing well and wears a CPAP each night.  He states that he does benefit from CPAP and is much less drowsy during the day after using it at night.  He denies any issues with his mask.  Patient continues to benefit from CPAP with good compliance and control documented Continue CPAP at current settings Continue current medications Goal of 4 hours or more usage per night Maintain healthy weight Do not drive if drowsy   Follow Up Instructions:  Follow up with Dr. Elsworth Soho in 1 year or sooner if needed   I discussed the assessment and treatment plan with the patient. The patient was provided an opportunity to ask questions and all were  answered. The patient agreed with the plan and demonstrated an understanding of the instructions.   The patient was advised to call back or seek an in-person evaluation if the symptoms worsen or if the condition fails to improve as anticipated.  I provided 22 minutes of non-face-to-face time during this encounter.   Fenton Foy, NP

## 2018-12-02 NOTE — Progress Notes (Signed)

## 2018-12-02 NOTE — Assessment & Plan Note (Signed)
Discussion: Patient states that this is been a stable interval for him.  He is doing well and wears a CPAP each night.  He states that he does benefit from CPAP and is much less drowsy during the day after using it at night.  He denies any issues with his mask.  Patient continues to benefit from CPAP with good compliance and control documented Continue CPAP at current settings Continue current medications Goal of 4 hours or more usage per night Maintain healthy weight Do not drive if drowsy Follow up with Dr. Elsworth Soho in 1 year or sooner if needed

## 2018-12-14 ENCOUNTER — Ambulatory Visit: Payer: Managed Care, Other (non HMO) | Admitting: Pulmonary Disease

## 2019-04-10 ENCOUNTER — Encounter: Payer: Self-pay | Admitting: Family Medicine

## 2019-04-10 ENCOUNTER — Telehealth: Payer: Self-pay | Admitting: Family Medicine

## 2019-04-10 DIAGNOSIS — Z125 Encounter for screening for malignant neoplasm of prostate: Secondary | ICD-10-CM

## 2019-04-10 DIAGNOSIS — I10 Essential (primary) hypertension: Secondary | ICD-10-CM

## 2019-04-10 DIAGNOSIS — Z0000 Encounter for general adult medical examination without abnormal findings: Secondary | ICD-10-CM

## 2019-04-10 DIAGNOSIS — E7800 Pure hypercholesterolemia, unspecified: Secondary | ICD-10-CM

## 2019-04-10 NOTE — Telephone Encounter (Signed)
-----   Message from Ellamae Sia sent at 04/04/2019  3:19 PM EDT ----- Regarding: Lab orders for Tuesday, 8.11.20 Patient is scheduled for CPX labs, please order future labs, Thanks , Karna Christmas

## 2019-04-12 ENCOUNTER — Other Ambulatory Visit (INDEPENDENT_AMBULATORY_CARE_PROVIDER_SITE_OTHER): Payer: Managed Care, Other (non HMO)

## 2019-04-12 ENCOUNTER — Other Ambulatory Visit: Payer: Self-pay

## 2019-04-12 DIAGNOSIS — Z125 Encounter for screening for malignant neoplasm of prostate: Secondary | ICD-10-CM

## 2019-04-12 DIAGNOSIS — E7800 Pure hypercholesterolemia, unspecified: Secondary | ICD-10-CM | POA: Diagnosis not present

## 2019-04-12 DIAGNOSIS — I10 Essential (primary) hypertension: Secondary | ICD-10-CM

## 2019-04-12 LAB — CBC WITH DIFFERENTIAL/PLATELET
Basophils Absolute: 0 10*3/uL (ref 0.0–0.1)
Basophils Relative: 0.5 % (ref 0.0–3.0)
Eosinophils Absolute: 0.3 10*3/uL (ref 0.0–0.7)
Eosinophils Relative: 3 % (ref 0.0–5.0)
HCT: 40.8 % (ref 39.0–52.0)
Hemoglobin: 13.7 g/dL (ref 13.0–17.0)
Lymphocytes Relative: 31.3 % (ref 12.0–46.0)
Lymphs Abs: 2.6 10*3/uL (ref 0.7–4.0)
MCHC: 33.5 g/dL (ref 30.0–36.0)
MCV: 87.8 fl (ref 78.0–100.0)
Monocytes Absolute: 0.7 10*3/uL (ref 0.1–1.0)
Monocytes Relative: 7.9 % (ref 3.0–12.0)
Neutro Abs: 4.8 10*3/uL (ref 1.4–7.7)
Neutrophils Relative %: 57.3 % (ref 43.0–77.0)
Platelets: 226 10*3/uL (ref 150.0–400.0)
RBC: 4.65 Mil/uL (ref 4.22–5.81)
RDW: 14.6 % (ref 11.5–15.5)
WBC: 8.3 10*3/uL (ref 4.0–10.5)

## 2019-04-12 LAB — COMPREHENSIVE METABOLIC PANEL
ALT: 30 U/L (ref 0–53)
AST: 20 U/L (ref 0–37)
Albumin: 4.3 g/dL (ref 3.5–5.2)
Alkaline Phosphatase: 46 U/L (ref 39–117)
BUN: 18 mg/dL (ref 6–23)
CO2: 28 mEq/L (ref 19–32)
Calcium: 9.2 mg/dL (ref 8.4–10.5)
Chloride: 104 mEq/L (ref 96–112)
Creatinine, Ser: 1.16 mg/dL (ref 0.40–1.50)
GFR: 65.07 mL/min (ref >60.00)
Glucose, Bld: 102 mg/dL — ABNORMAL HIGH (ref 70–99)
Potassium: 4.3 mEq/L (ref 3.5–5.1)
Sodium: 141 mEq/L (ref 135–145)
Total Bilirubin: 0.4 mg/dL (ref 0.2–1.2)
Total Protein: 6.7 g/dL (ref 6.0–8.3)

## 2019-04-12 LAB — LIPID PANEL
Cholesterol: 146 mg/dL (ref 0–200)
HDL: 31.2 mg/dL — ABNORMAL LOW (ref >39.00)
LDL Cholesterol: 85 mg/dL (ref 0–99)
NonHDL: 115.25
Total CHOL/HDL Ratio: 5
Triglycerides: 150 mg/dL — ABNORMAL HIGH (ref 0.0–149.0)
VLDL: 30 mg/dL (ref 0.0–40.0)

## 2019-04-12 LAB — TSH: TSH: 1.74 u[IU]/mL (ref 0.35–4.50)

## 2019-04-12 LAB — PSA: PSA: 1.02 ng/mL (ref 0.10–4.00)

## 2019-04-15 ENCOUNTER — Ambulatory Visit (INDEPENDENT_AMBULATORY_CARE_PROVIDER_SITE_OTHER): Payer: Managed Care, Other (non HMO) | Admitting: Family Medicine

## 2019-04-15 ENCOUNTER — Encounter: Payer: Self-pay | Admitting: Family Medicine

## 2019-04-15 ENCOUNTER — Other Ambulatory Visit: Payer: Self-pay

## 2019-04-15 VITALS — BP 120/86 | HR 92 | Temp 97.1°F | Ht 70.0 in | Wt 287.0 lb

## 2019-04-15 DIAGNOSIS — Z800 Family history of malignant neoplasm of digestive organs: Secondary | ICD-10-CM

## 2019-04-15 DIAGNOSIS — Z0000 Encounter for general adult medical examination without abnormal findings: Secondary | ICD-10-CM

## 2019-04-15 DIAGNOSIS — E7800 Pure hypercholesterolemia, unspecified: Secondary | ICD-10-CM

## 2019-04-15 DIAGNOSIS — Z125 Encounter for screening for malignant neoplasm of prostate: Secondary | ICD-10-CM

## 2019-04-15 DIAGNOSIS — I10 Essential (primary) hypertension: Secondary | ICD-10-CM | POA: Diagnosis not present

## 2019-04-15 DIAGNOSIS — Z23 Encounter for immunization: Secondary | ICD-10-CM

## 2019-04-15 DIAGNOSIS — F429 Obsessive-compulsive disorder, unspecified: Secondary | ICD-10-CM

## 2019-04-15 NOTE — Assessment & Plan Note (Signed)
bp in fair control at this time  BP Readings from Last 1 Encounters:  04/15/19 120/86   No changes needed Most recent labs reviewed  Disc lifstyle change with low sodium diet and exercise  Enc to keep loosing weight  Also start exercise

## 2019-04-15 NOTE — Progress Notes (Signed)
Subjective:    Patient ID: Jeffrey Hartman, male    DOB: 1960-12-29, 58 y.o.   MRN: [id number]  HPI Here for health maintenance exam and to review chronic medical problems    Can work at home -it is going well   He was sick in march - ? If he could have had covid   Weight Wt Readings from Last 3 Encounters:  04/15/19 287 lb (130.2 kg)  08/18/18 296 lb (134.3 kg)  04/12/18 296 lb 8 oz (134.5 kg)  is starting to loose weight  He is skipping a mid day meal - figured out he did not need it  Not eating out or doing carry out  41.18 kg/m   Family is getting an exercise bike   Flu vaccine -will get in the fall  Tdap 4/14 Zoster status    - zostavax 11/16 Wants the shingrix vaccine -covered by his ins   Colonoscopy 11/18 with 5 y recall  Mother had colon cancer at 53 No bowel changes   Prostate health Lab Results  Component Value Date   PSA 1.02 04/12/2019   PSA 1.36 04/07/2018   PSA 1.50 03/16/2017   no symptoms at all  No nocturia  No frequency  Still taking clomid (testosterone def)    HTN bp is stable today  No cp or palpitations or headaches or edema  No side effects to medicines  BP Readings from Last 3 Encounters:  04/15/19 120/86  08/18/18 130/90  04/12/18 138/78       Lab Results  Component Value Date   CREATININE 1.16 04/12/2019   BUN 18 04/12/2019   NA 141 04/12/2019   K 4.3 04/12/2019   CL 104 04/12/2019   CO2 28 04/12/2019   Lab Results  Component Value Date   ALT 30 04/12/2019   AST 20 04/12/2019   ALKPHOS 46 04/12/2019   BILITOT 0.4 04/12/2019    Lab Results  Component Value Date   WBC 8.3 04/12/2019   HGB 13.7 04/12/2019   HCT 40.8 04/12/2019   MCV 87.8 04/12/2019   PLT 226.0 04/12/2019   Lab Results  Component Value Date   TSH 1.74 04/12/2019     Hyperlipidemia Lab Results  Component Value Date   CHOL 146 04/12/2019   CHOL 146 04/07/2018   CHOL 148 03/16/2017   Lab Results  Component Value Date   HDL 31.20 (L)  04/12/2019   HDL 31.20 (L) 04/07/2018   HDL 32.80 (L) 03/16/2017   Lab Results  Component Value Date   LDLCALC 85 04/12/2019   LDLCALC 90 04/07/2018   LDLCALC 89 03/16/2017   Lab Results  Component Value Date   TRIG 150.0 (H) 04/12/2019   TRIG 126.0 04/07/2018   TRIG 128.0 03/16/2017   Lab Results  Component Value Date   CHOLHDL 5 04/12/2019   CHOLHDL 5 04/07/2018   CHOLHDL 4 03/16/2017   Lab Results  Component Value Date   LDLDIRECT 109.0 09/21/2009  zocor -continues  Baseline low HDL Eating less Eating better  Not a lot of exercise     Mood /h/o OCD-lexapro Doing pretty well overall given stressors  Needs to exercise   Patient Active Problem List   Diagnosis Date Noted  . Eczema 08/24/2015  . Cerumen impaction 06/16/2014  . Low testosterone 11/09/2013  . Prostate cancer screening 11/02/2013  . Adenomatous colon polyp 07/28/2012  . Family history of malignant neoplasm of gastrointestinal tract 06/29/2012  . Anal fissure 06/29/2012  .  Personal history of colonic polyps 06/29/2012  . Routine general medical examination at a health care facility 09/17/2011  . Morbid obesity ([place]) 01/01/2010  . Allergic rhinitis 10/13/2008  . Obstructive sleep apnea 08/11/2007  . Obsessive-compulsive disorder 01/21/2007  . ACTINIC SKIN DAMAGE 01/21/2007  . Hyperlipidemia 01/13/2007  . Essential hypertension 01/13/2007   Past Medical History:  Diagnosis Date  . Anxiety states   . Dermatophytosis of nail   . Obsessive-compulsive disorders   . Obstructive sleep apnea (adult) (pediatric)   . Other and unspecified hyperlipidemia   . Seasonal allergic rhinitis   . Unspecified dermatitis due to sun   . Unspecified essential hypertension   . Unspecified sleep apnea    Past Surgical History:  Procedure Laterality Date  . COLONOSCOPY  11/02, 11/05   polyps  . CYSTECTOMY     cyst on hand   Social History   Tobacco Use  . Smoking status: Never Smoker  . Smokeless  tobacco: Never Used  Substance Use Topics  . Alcohol use: Yes    Alcohol/week: 0.0 standard drinks    Comment: occasionally,1 beer once month  . Drug use: No   Family History  Problem Relation Age of Onset  . Hypertension Father   . Diabetes type II Father   . Cancer - Other Father 109       Adrenal Cancer  . Colon cancer Mother 76  . Prostate cancer Neg Hx    Allergies  Allergen Reactions  . Shellfish Allergy Shortness Of Breath and Other (See Comments)    Reaction=tunnel vision  . Amlodipine Besy-Benazepril Hcl     REACTION: Generic med did not adequatly control htnh  . Sertraline Hcl Nausea Only    REACTION: nausea, ED   Current Outpatient Medications on File Prior to Visit  Medication Sig Dispense Refill  . aspirin 81 MG tablet Take 81 mg by mouth daily.      . Azelastine HCl (ASTEPRO) 0.15 % SOLN Place 1 spray into both nostrils 2 (two) times daily. (Patient taking differently: Place 1 spray into both nostrils 2 (two) times daily as needed. ) 90 mL 3  . clomiPHENE (CLOMID) 50 MG tablet Take 50 mg by mouth at bedtime.    Mariane Baumgarten Sodium 100 MG capsule Take 100 mg by mouth daily.    Marland Kitchen escitalopram (LEXAPRO) 20 MG tablet Take 1 tablet (20 mg total) by mouth daily. 90 tablet 3  . Fexofenadine HCl (ALLEGRA PO) Take 1 tablet by mouth daily. As directed    . fish oil-omega-3 fatty acids 1000 MG capsule Take 1 g by mouth daily.     . fluticasone (FLONASE) 50 MCG/ACT nasal spray Place 2 sprays into both nostrils daily as needed. 48 g 3  . hydrocortisone (ANUSOL-HC) 25 MG suppository Place 1 suppository (25 mg total) rectally every evening. As needed for 7 days as needed for rectal pain or bleeding 14 suppository 2  . LOTREL 5-10 MG capsule Take 1 capsule by mouth daily. 90 capsule 3  . Melatonin 5 MG TABS Take 10 mg by mouth at bedtime.    . Multiple Vitamin (MULTIVITAMIN) capsule Take 1 capsule by mouth daily.    . simvastatin (ZOCOR) 20 MG tablet Take 1 tablet (20 mg total) by  mouth daily. 90 tablet 3  . triamcinolone cream (KENALOG) 0.5 % Apply 1 application topically 3 (three) times daily. To rash/affected area 30 g 0   No current facility-administered medications on file prior to visit.  Review of Systems  Constitutional: Negative for activity change, appetite change, fatigue, fever and unexpected weight change.  HENT: Negative for congestion, rhinorrhea, sore throat and trouble swallowing.   Eyes: Negative for pain, redness, itching and visual disturbance.  Respiratory: Negative for cough, chest tightness, shortness of breath and wheezing.   Cardiovascular: Negative for chest pain and palpitations.  Gastrointestinal: Negative for abdominal pain, blood in stool, constipation, diarrhea and nausea.  Endocrine: Negative for cold intolerance, heat intolerance, polydipsia and polyuria.  Genitourinary: Negative for difficulty urinating, dysuria, frequency and urgency.  Musculoskeletal: Negative for arthralgias, joint swelling and myalgias.  Skin: Negative for pallor and rash.  Neurological: Negative for dizziness, tremors, weakness, numbness and headaches.  Hematological: Negative for adenopathy. Does not bruise/bleed easily.  Psychiatric/Behavioral: Negative for decreased concentration and dysphoric mood. The patient is not nervous/anxious.        Objective:   Physical Exam Constitutional:      General: He is not in acute distress.    Appearance: He is well-developed. He is obese. He is not ill-appearing.  HENT:     Head: Normocephalic and atraumatic.     Right Ear: Tympanic membrane, ear canal and external ear normal.     Left Ear: Tympanic membrane, ear canal and external ear normal.     Ears:     Comments: Scant cerumen bilaterally    Nose: Nose normal.     Mouth/Throat:     Mouth: Mucous membranes are moist.     Pharynx: Oropharynx is clear. No posterior oropharyngeal erythema.  Eyes:     General: No scleral icterus.       Right eye: No  discharge.        Left eye: No discharge.     Conjunctiva/sclera: Conjunctivae normal.     Pupils: Pupils are equal, round, and reactive to light.  Neck:     Musculoskeletal: Normal range of motion and neck supple.     Thyroid: No thyromegaly.     Vascular: No carotid bruit or JVD.  Cardiovascular:     Rate and Rhythm: Regular rhythm. Tachycardia present.     Pulses: Normal pulses.     Heart sounds: Normal heart sounds. No gallop.   Pulmonary:     Effort: Pulmonary effort is normal. No respiratory distress.     Breath sounds: Normal breath sounds. No wheezing.  Chest:     Chest wall: No tenderness.  Abdominal:     General: Bowel sounds are normal. There is no distension or abdominal bruit.     Palpations: Abdomen is soft. There is no mass.     Tenderness: There is no abdominal tenderness.     Hernia: No hernia is present.  Musculoskeletal:        General: No tenderness.     Right lower leg: No edema.     Left lower leg: No edema.  Lymphadenopathy:     Cervical: No cervical adenopathy.  Skin:    General: Skin is warm and dry.     Coloration: Skin is not pale.     Findings: No erythema or rash.     Comments: Solar lentigines diffusely    Neurological:     Mental Status: He is alert.     Cranial Nerves: No cranial nerve deficit.     Motor: No abnormal muscle tone.     Coordination: Coordination normal.     Gait: Gait normal.     Deep Tendon Reflexes: Reflexes are normal and symmetric. Reflexes  normal.  Psychiatric:        Mood and Affect: Mood normal.        Cognition and Memory: Cognition and memory normal.           Assessment & Plan:   Problem List Items Addressed This Visit      Cardiovascular and Mediastinum   Essential hypertension    bp in fair control at this time  BP Readings from Last 1 Encounters:  04/15/19 120/86   No changes needed Most recent labs reviewed  Disc lifstyle change with low sodium diet and exercise  Enc to keep loosing weight   Also start exercise         Other   Hyperlipidemia    Disc goals for lipids and reasons to control them Rev last labs with pt Rev low sat fat diet in detail  Continues simvastatin with good LDL control  Baseline low HDL-again recommended exercise       Morbid obesity ([place])    Discussed how this problem influences overall health and the risks it imposes  Reviewed plan for weight loss with lower calorie diet (via better food choices and also portion control or program like weight watchers) and exercise building up to or more than 30 minutes 5 days per week including some aerobic activity         Obsessive-compulsive disorder    Doing well with lexapro  Reviewed stressors/ coping techniques/symptoms/ support sources/ tx options and side effects in detail today  Working at home during the pandemic-this has gone well  Enc exercise and good self care      Routine general medical examination at a health care facility - Primary    Reviewed health habits including diet and exercise and skin cancer prevention Reviewed appropriate screening tests for age  Also reviewed health mt list, fam hx and immunization status , as well as social and family history   See HPI Labs reviewed  Plans to get flu shot in the fall  Strongly enc more wt loss and also regular exercise       Family history of malignant neoplasm of gastrointestinal tract    Pt gets colonoscopy every 5 years  Last one was in 11/18       Prostate cancer screening    Lab Results  Component Value Date   PSA 1.02 04/12/2019   PSA 1.36 04/07/2018   PSA 1.50 03/16/2017   No clinical changes No fam hx       Other Visit Diagnoses    Need for shingles vaccine       Relevant Orders   Varicella-zoster vaccine IM (Shingrix) (Completed)

## 2019-04-15 NOTE — Patient Instructions (Addendum)
Start exercising when you can -bike/walk /video   Keep working on healthy diet for weight loss  Try to get most of your carbohydrates from produce (with the exception of white potatoes)  Eat less bread/pasta/rice/snack foods/cereals/sweets and other items from the middle of the grocery store (processed carbs) This will also help prevent diabetes as well   Get your flu shot in the fall

## 2019-04-17 NOTE — Assessment & Plan Note (Signed)
Reviewed health habits including diet and exercise and skin cancer prevention Reviewed appropriate screening tests for age  Also reviewed health mt list, fam hx and immunization status , as well as social and family history   See HPI Labs reviewed  Plans to get flu shot in the fall  Strongly enc more wt loss and also regular exercise

## 2019-04-17 NOTE — Assessment & Plan Note (Signed)
Disc goals for lipids and reasons to control them Rev last labs with pt Rev low sat fat diet in detail  Continues simvastatin with good LDL control  Baseline low HDL-again recommended exercise

## 2019-04-17 NOTE — Assessment & Plan Note (Signed)
Lab Results  Component Value Date   PSA 1.02 04/12/2019   PSA 1.36 04/07/2018   PSA 1.50 03/16/2017   No clinical changes No fam hx

## 2019-04-17 NOTE — Assessment & Plan Note (Signed)
Pt gets colonoscopy every 5 years  Last one was in 11/18

## 2019-04-17 NOTE — Assessment & Plan Note (Signed)
Doing well with lexapro  Reviewed stressors/ coping techniques/symptoms/ support sources/ tx options and side effects in detail today  Working at home during the pandemic-this has gone well  Enc exercise and good self care

## 2019-04-17 NOTE — Assessment & Plan Note (Signed)
Discussed how this problem influences overall health and the risks it imposes  Reviewed plan for weight loss with lower calorie diet (via better food choices and also portion control or program like weight watchers) and exercise building up to or more than 30 minutes 5 days per week including some aerobic activity    

## 2019-04-27 ENCOUNTER — Other Ambulatory Visit: Payer: Self-pay | Admitting: Family Medicine

## 2019-04-27 ENCOUNTER — Encounter: Payer: Self-pay | Admitting: Family Medicine

## 2019-05-01 ENCOUNTER — Other Ambulatory Visit: Payer: Self-pay | Admitting: Family Medicine

## 2019-05-04 ENCOUNTER — Telehealth: Payer: Self-pay | Admitting: *Deleted

## 2019-05-04 NOTE — Telephone Encounter (Signed)
PA for name brand done for Lotrel, I will await a response

## 2019-05-04 NOTE — Telephone Encounter (Signed)
PA was approved. Pt notified, copy of letter placed in Dr. Marliss Coots inbox to sign and send for scanning

## 2019-06-21 ENCOUNTER — Ambulatory Visit (INDEPENDENT_AMBULATORY_CARE_PROVIDER_SITE_OTHER): Payer: Managed Care, Other (non HMO)

## 2019-06-21 DIAGNOSIS — Z23 Encounter for immunization: Secondary | ICD-10-CM | POA: Diagnosis not present

## 2019-06-21 NOTE — Progress Notes (Signed)
Patient received Shingrix #2 today. Patient tolerated injection well. Patient did not have any side effects or issue with Shingrix #1.

## 2019-11-30 ENCOUNTER — Encounter: Payer: Self-pay | Admitting: Family Medicine

## 2020-02-22 ENCOUNTER — Encounter: Payer: Self-pay | Admitting: Primary Care

## 2020-02-22 ENCOUNTER — Ambulatory Visit (INDEPENDENT_AMBULATORY_CARE_PROVIDER_SITE_OTHER): Payer: Managed Care, Other (non HMO) | Admitting: Primary Care

## 2020-02-22 ENCOUNTER — Other Ambulatory Visit: Payer: Self-pay

## 2020-02-22 VITALS — BP 120/82 | HR 82 | Temp 96.3°F | Ht 70.0 in | Wt 295.8 lb

## 2020-02-22 DIAGNOSIS — W57XXXA Bitten or stung by nonvenomous insect and other nonvenomous arthropods, initial encounter: Secondary | ICD-10-CM | POA: Insufficient documentation

## 2020-02-22 DIAGNOSIS — S70361A Insect bite (nonvenomous), right thigh, initial encounter: Secondary | ICD-10-CM

## 2020-02-22 MED ORDER — TRIAMCINOLONE ACETONIDE 0.5 % EX CREA: 1.0000 "application " | TOPICAL_CREAM | Freq: Two times a day (BID) | CUTANEOUS | 0 refills | Status: DC

## 2020-02-22 MED ORDER — DOXYCYCLINE HYCLATE 100 MG PO TABS: 100.0000 mg | ORAL_TABLET | Freq: Two times a day (BID) | ORAL | 0 refills | Status: DC

## 2020-02-22 NOTE — Progress Notes (Signed)
Subjective:    Patient ID: Jeffrey Hartman, male    DOB: May 08, 1961, 59 y.o.   MRN: [id number]  HPI  This visit occurred during the SARS-CoV-2 public health emergency.  Safety protocols were in place, including screening questions prior to the visit, additional usage of staff PPE, and extensive cleaning of exam room while observing appropriate contact time as indicated for disinfecting solutions.   Jeffrey Hartman is a 59 year old male patient of Dr. Glori Bickers with a history of OSA, hypertension, hyperlipidemia, low testosterone who presents today with a chief complaint of insect bite.   His bite is located to the right inner thigh for which he noticed two days ago while showering. The wound area was red with swelling, tenderness, and itching, about the size of a nickel.   Yesterday morning he woke up and noticed the wound site had doubled in size. Redness, swelling, and itching continued. This morning he woke up and the site was even larger and his symptoms progressed.   He denies fevers, known tick bites, outdoor work, drainage. He's applied OTC cortisone cream with temporary improvement in itching.   Review of Systems  Constitutional: Negative for fever.  Cardiovascular: Negative for palpitations.  Musculoskeletal: Negative for arthralgias.  Skin: Positive for color change and wound.  Neurological: Negative for headaches.       Past Medical History:  Diagnosis Date  . Anxiety states   . Dermatophytosis of nail   . Obsessive-compulsive disorders   . Obstructive sleep apnea (adult) (pediatric)   . Other and unspecified hyperlipidemia   . Seasonal allergic rhinitis   . Unspecified dermatitis due to sun   . Unspecified essential hypertension   . Unspecified sleep apnea      Social History   Socioeconomic History  . Marital status: Married    Spouse name: Not on file  . Number of children: 0  . Years of education: Not on file  . Highest education level: Not on file    Occupational History  . Occupation: Audiological scientist: [employer]    Comment: [employer]  Tobacco Use  . Smoking status: Never Smoker  . Smokeless tobacco: Never Used  Vaping Use  . Vaping Use: Never used  Substance and Sexual Activity  . Alcohol use: Yes    Alcohol/week: 0.0 standard drinks    Comment: occasionally,1 beer once month  . Drug use: No  . Sexual activity: Not on file  Other Topics Concern  . Not on file  Social History Narrative  . Not on file   Social Determinants of Health   Financial Resource Strain:   . Difficulty of Paying Living Expenses:   Food Insecurity:   . Worried About Charity fundraiser in the Last Year:   . Arboriculturist in the Last Year:   Transportation Needs:   . Film/video editor (Medical):   Marland Kitchen Lack of Transportation (Non-Medical):   Physical Activity:   . Days of Exercise per Week:   . Minutes of Exercise per Session:   Stress:   . Feeling of Stress :   Social Connections:   . Frequency of Communication with Friends and Family:   . Frequency of Social Gatherings with Friends and Family:   . Attends Religious Services:   . Active Member of Clubs or Organizations:   . Attends Archivist Meetings:   Marland Kitchen Marital Status:   Intimate Partner Violence:   .  Fear of Current or Ex-Partner:   . Emotionally Abused:   Marland Kitchen Physically Abused:   . Sexually Abused:     Past Surgical History:  Procedure Laterality Date  . COLONOSCOPY  11/02, 11/05   polyps  . CYSTECTOMY     cyst on hand    Family History  Problem Relation Age of Onset  . Hypertension Father   . Diabetes type II Father   . Cancer - Other Father 66       Adrenal Cancer  . Colon cancer Mother 24  . Prostate cancer Neg Hx     Allergies  Allergen Reactions  . Shellfish Allergy Shortness Of Breath and Other (See Comments)    Reaction=tunnel vision  . Amlodipine Besy-Benazepril Hcl     REACTION: Generic med did not adequatly  control htnh  . Sertraline Hcl Nausea Only    REACTION: nausea, ED    Current Outpatient Medications on File Prior to Visit  Medication Sig Dispense Refill  . aspirin 81 MG tablet Take 81 mg by mouth daily.      . Azelastine HCl (ASTEPRO) 0.15 % SOLN Place 1 spray into both nostrils 2 (two) times daily. (Patient taking differently: Place 1 spray into both nostrils 2 (two) times daily as needed. ) 90 mL 3  . clomiPHENE (CLOMID) 50 MG tablet Take 50 mg by mouth at bedtime.    Mariane Baumgarten Sodium 100 MG capsule Take 100 mg by mouth daily.    Marland Kitchen escitalopram (LEXAPRO) 20 MG tablet TAKE 1 TABLET DAILY 90 tablet 3  . Fexofenadine HCl (ALLEGRA PO) Take 1 tablet by mouth daily. As directed    . fish oil-omega-3 fatty acids 1000 MG capsule Take 1 g by mouth daily.     . fluticasone (FLONASE) 50 MCG/ACT nasal spray Place 2 sprays into both nostrils daily as needed. 48 g 3  . hydrocortisone (ANUSOL-HC) 25 MG suppository Place 1 suppository (25 mg total) rectally every evening. As needed for 7 days as needed for rectal pain or bleeding 14 suppository 2  . LOTREL 5-10 MG capsule TAKE 1 CAPSULE DAILY 90 capsule 3  . Melatonin 5 MG TABS Take 10 mg by mouth at bedtime.    . Multiple Vitamin (MULTIVITAMIN) capsule Take 1 capsule by mouth daily.    . simvastatin (ZOCOR) 20 MG tablet TAKE 1 TABLET DAILY 90 tablet 3   No current facility-administered medications on file prior to visit.    BP 120/82   Pulse 82   Temp (!) 96.3 F (35.7 C) (Temporal)   Ht 5\' 10"  (1.778 m)   Wt 295 lb 12 oz (134.2 kg)   SpO2 98%   BMI 42.44 kg/m    Objective:   Physical Exam  Respiratory: Effort normal.  Neurological: He is alert.  Skin: Skin is warm and dry. Rash noted. There is erythema.  Circular area of erythema measuring approx 4 cm in diameter with centrally located bite site. Liner, firm mass underneath bite site measuring 2 cm in length X 0.5 cm in width. Tender. No drainage. Moderate erythema, warmth.             Assessment & Plan:

## 2020-02-22 NOTE — Assessment & Plan Note (Signed)
With acute, early cellulitis. Appears infectious, doesn't appear to be Lyme at this point. Will cover for potential Lyme and/or MRSA.  Rx for Doxycycline course sent to pharmacy. Rx for topical triamcinolone cream set to pharmacy for itching.  Return precautions provided, he will update if no improvement.

## 2020-02-22 NOTE — Patient Instructions (Signed)
Start doxycycline antibiotics for the skin infection. Take 1 tablet by mouth twice daily for seven days.  You may apply the topical steroid cream twice daily for one week.  Please update if no improvement within 3 days, and/or without resolve after 10 days.  It was a pleasure meeting you!

## 2020-03-09 ENCOUNTER — Telehealth (INDEPENDENT_AMBULATORY_CARE_PROVIDER_SITE_OTHER): Payer: Managed Care, Other (non HMO) | Admitting: Family Medicine

## 2020-03-09 ENCOUNTER — Other Ambulatory Visit: Payer: Self-pay

## 2020-03-09 ENCOUNTER — Encounter: Payer: Self-pay | Admitting: Family Medicine

## 2020-03-09 VITALS — Temp 98.8°F

## 2020-03-09 DIAGNOSIS — J069 Acute upper respiratory infection, unspecified: Secondary | ICD-10-CM | POA: Diagnosis not present

## 2020-03-09 MED ORDER — PREDNISONE 10 MG PO TABS: ORAL_TABLET | ORAL | 0 refills | Status: DC

## 2020-03-09 MED ORDER — HYDROCODONE-HOMATROPINE 5-1.5 MG/5ML PO SYRP: 5.0000 mL | ORAL_SOLUTION | Freq: Three times a day (TID) | ORAL | 0 refills | Status: DC | PRN

## 2020-03-09 NOTE — Progress Notes (Signed)
Virtual Visit via Video Note  I connected with Jeffrey Hartman on 03/09/20 at  9:00 AM EDT by a video enabled telemedicine application and verified that I am speaking with the correct person using two identifiers.  Location: Patient: home Provider: office   I discussed the limitations of evaluation and management by telemedicine and the availability of in person appointments. The patient expressed understanding and agreed to proceed.  Parties involved in encounter  Patient: Jeffrey Hartman  Provider:  Loura Pardon MD    History of Present Illness: Pt presents with pnd   Started Friday last week-mild drainage and ST  (wife was sick) Saturday -worse Taking flonase and allergy medicine   Now mostly drainage in throat and high chest congestion   Mucous is clear when he blows nose    [facility]ent to [facility] [address] Dx with Jeffrey Hartman  Given abx  Sent home   Wife got much worse -needed steroids   Cough -mostly dry  Can hear rattle but not getting it out  Hears wheezing at night   No sinus pain    Wife was neg for covid   Taking mucinex   He had covid immunizations in march   Patient Active Problem List   Diagnosis Date Noted  . Insect bite of right thigh 02/22/2020  . Viral URI with cough 09/16/2016  . Eczema 08/24/2015  . Low testosterone 11/09/2013  . Prostate cancer screening 11/02/2013  . Adenomatous colon polyp 07/28/2012  . Family history of malignant neoplasm of gastrointestinal tract 06/29/2012  . Anal fissure 06/29/2012  . Personal history of colonic polyps 06/29/2012  . Routine general medical examination at a health care facility 09/17/2011  . Morbid obesity ([place]) 01/01/2010  . Allergic rhinitis 10/13/2008  . Obstructive sleep apnea 08/11/2007  . Obsessive-compulsive disorder 01/21/2007  . ACTINIC SKIN DAMAGE 01/21/2007  . Hyperlipidemia 01/13/2007  . Essential hypertension 01/13/2007   Past Medical History:  Diagnosis Date  . Anxiety states   .  Dermatophytosis of nail   . Obsessive-compulsive disorders   . Obstructive sleep apnea (adult) (pediatric)   . Other and unspecified hyperlipidemia   . Seasonal allergic rhinitis   . Unspecified dermatitis due to sun   . Unspecified essential hypertension   . Unspecified sleep apnea    Past Surgical History:  Procedure Laterality Date  . COLONOSCOPY  11/02, 11/05   polyps  . CYSTECTOMY     cyst on hand   Social History   Tobacco Use  . Smoking status: Never Smoker  . Smokeless tobacco: Never Used  Vaping Use  . Vaping Use: Never used  Substance Use Topics  . Alcohol use: Yes    Alcohol/week: 0.0 standard drinks    Comment: occasionally,1 beer once month  . Drug use: No   Family History  Problem Relation Age of Onset  . Hypertension Father   . Diabetes type II Father   . Cancer - Other Father 78       Adrenal Cancer  . Colon cancer Mother 50  . Prostate cancer Neg Hx    Allergies  Allergen Reactions  . Shellfish Allergy Shortness Of Breath and Other (See Comments)    Reaction=tunnel vision  . Amlodipine Besy-Benazepril Hcl     REACTION: Generic med did not adequatly control htnh  . Sertraline Hcl Nausea Only    REACTION: nausea, ED   Current Outpatient Medications on File Prior to Visit  Medication Sig Dispense Refill  . aspirin 81 MG  tablet Take 81 mg by mouth daily.      . Azelastine HCl (ASTEPRO) 0.15 % SOLN Place 1 spray into both nostrils 2 (two) times daily. (Patient taking differently: Place 1 spray into both nostrils 2 (two) times daily as needed. ) 90 mL 3  . clomiPHENE (CLOMID) 50 MG tablet Take 50 mg by mouth at bedtime.    Mariane Baumgarten Sodium 100 MG capsule Take 100 mg by mouth daily.    Marland Kitchen escitalopram (LEXAPRO) 20 MG tablet TAKE 1 TABLET DAILY 90 tablet 3  . Fexofenadine HCl (ALLEGRA PO) Take 1 tablet by mouth daily. As directed    . fish oil-omega-3 fatty acids 1000 MG capsule Take 1 g by mouth daily.     . fluticasone (FLONASE) 50 MCG/ACT nasal  spray Place 2 sprays into both nostrils daily as needed. 48 g 3  . hydrocortisone (ANUSOL-HC) 25 MG suppository Place 1 suppository (25 mg total) rectally every evening. As needed for 7 days as needed for rectal pain or bleeding 14 suppository 2  . LOTREL 5-10 MG capsule TAKE 1 CAPSULE DAILY 90 capsule 3  . Melatonin 5 MG TABS Take 10 mg by mouth at bedtime.    . Multiple Vitamin (MULTIVITAMIN) capsule Take 1 capsule by mouth daily.    . simvastatin (ZOCOR) 20 MG tablet TAKE 1 TABLET DAILY 90 tablet 3  . triamcinolone cream (KENALOG) 0.5 % Apply 1 application topically 2 (two) times daily. 30 g 0   No current facility-administered medications on file prior to visit.   Review of Systems  Constitutional: Positive for malaise/fatigue. Negative for chills and fever.       Low grade temp   HENT: Positive for congestion. Negative for ear pain, sinus pain and sore throat.   Eyes: Negative for blurred vision, discharge and redness.  Respiratory: Positive for cough and wheezing. Negative for shortness of breath and stridor.   Cardiovascular: Negative for chest pain, palpitations and leg swelling.  Gastrointestinal: Negative for abdominal pain, diarrhea, nausea and vomiting.  Musculoskeletal: Negative for myalgias.  Skin: Negative for rash.  Neurological: Negative for dizziness and headaches.    Observations/Objective: Patient appears well, in no distress Weight is baseline  No facial swelling or asymmetry Voice is mildly hoarse No obvious tremor or mobility impairment Moving neck and UEs normally Able to hear the call well  Mild junky sounding cough / no audible wheezing  Talkative and mentally sharp with no cognitive changes No skin changes on face or neck , no rash or pallor Affect is normal    Assessment and Plan: Problem List Items Addressed This Visit      Respiratory   Viral URI with cough - Primary    Caught from his wife who tested covid neg and they are both immunized  Tight  chest/wheezing-px prednisone taper Hycodan to help cough with caution of sedation Fluids/rest Update if not starting to improve in a week or if worsening   Also if new symptoms           Follow Up Instructions: Take prednisone as directed  Hycodan for cough with caution of sedation  Watch for increasing temp or colored sputum Update if not starting to improve in a week or if worsening     I discussed the assessment and treatment plan with the patient. The patient was provided an opportunity to ask questions and all were answered. The patient agreed with the plan and demonstrated an understanding of the instructions.  The patient was advised to call back or seek an in-person evaluation if the symptoms worsen or if the condition fails to improve as anticipated.     Loura Pardon, MD

## 2020-03-10 NOTE — Assessment & Plan Note (Signed)
Caught from his wife who tested covid neg and they are both immunized  Tight chest/wheezing-px prednisone taper Hycodan to help cough with caution of sedation Fluids/rest Update if not starting to improve in a week or if worsening   Also if new symptoms

## 2020-03-10 NOTE — Patient Instructions (Signed)
Take prednisone as directed  Hycodan for cough with caution of sedation  Watch for increasing temp or colored sputum Update if not starting to improve in a week or if worsening

## 2020-04-09 ENCOUNTER — Encounter: Payer: Self-pay | Admitting: Family Medicine

## 2020-04-09 DIAGNOSIS — Z125 Encounter for screening for malignant neoplasm of prostate: Secondary | ICD-10-CM

## 2020-04-09 DIAGNOSIS — R7309 Other abnormal glucose: Secondary | ICD-10-CM

## 2020-04-09 DIAGNOSIS — I10 Essential (primary) hypertension: Secondary | ICD-10-CM

## 2020-04-09 DIAGNOSIS — E7800 Pure hypercholesterolemia, unspecified: Secondary | ICD-10-CM

## 2020-04-10 DIAGNOSIS — R7303 Prediabetes: Secondary | ICD-10-CM | POA: Insufficient documentation

## 2020-04-12 ENCOUNTER — Telehealth: Payer: Self-pay | Admitting: Family Medicine

## 2020-04-12 ENCOUNTER — Other Ambulatory Visit: Payer: Self-pay | Admitting: Family Medicine

## 2020-04-12 NOTE — Telephone Encounter (Signed)
-----   Message from Cloyd Stagers, RT sent at 03/29/2020  1:34 PM EDT ----- Regarding: Lab Orders for Friday 8.13.2021 Please place lab orders for Friday 8.13.2021, office visit for physical on Monday 8.16.2021 Thank you, Dyke Maes RT(R)

## 2020-04-13 ENCOUNTER — Other Ambulatory Visit: Payer: Self-pay

## 2020-04-13 ENCOUNTER — Other Ambulatory Visit (INDEPENDENT_AMBULATORY_CARE_PROVIDER_SITE_OTHER): Payer: Managed Care, Other (non HMO)

## 2020-04-13 ENCOUNTER — Other Ambulatory Visit: Payer: Self-pay | Admitting: Family Medicine

## 2020-04-13 DIAGNOSIS — R7309 Other abnormal glucose: Secondary | ICD-10-CM | POA: Diagnosis not present

## 2020-04-13 DIAGNOSIS — Z125 Encounter for screening for malignant neoplasm of prostate: Secondary | ICD-10-CM

## 2020-04-13 DIAGNOSIS — E7800 Pure hypercholesterolemia, unspecified: Secondary | ICD-10-CM | POA: Diagnosis not present

## 2020-04-13 DIAGNOSIS — I10 Essential (primary) hypertension: Secondary | ICD-10-CM | POA: Diagnosis not present

## 2020-04-13 LAB — LIPID PANEL
Cholesterol: 135 mg/dL (ref 0–200)
HDL: 31.1 mg/dL — ABNORMAL LOW (ref >39.00)
LDL Cholesterol: 80 mg/dL (ref 0–99)
NonHDL: 103.7
Total CHOL/HDL Ratio: 4
Triglycerides: 119 mg/dL (ref 0.0–149.0)
VLDL: 23.8 mg/dL (ref 0.0–40.0)

## 2020-04-13 LAB — CBC WITH DIFFERENTIAL/PLATELET
Basophils Absolute: 0 10*3/uL (ref 0.0–0.1)
Basophils Relative: 0.6 % (ref 0.0–3.0)
Eosinophils Absolute: 0.2 10*3/uL (ref 0.0–0.7)
Eosinophils Relative: 2.7 % (ref 0.0–5.0)
HCT: 39.4 % (ref 39.0–52.0)
Hemoglobin: 13.2 g/dL (ref 13.0–17.0)
Lymphocytes Relative: 28.5 % (ref 12.0–46.0)
Lymphs Abs: 2.2 10*3/uL (ref 0.7–4.0)
MCHC: 33.6 g/dL (ref 30.0–36.0)
MCV: 88.5 fl (ref 78.0–100.0)
Monocytes Absolute: 0.8 10*3/uL (ref 0.1–1.0)
Monocytes Relative: 9.6 % (ref 3.0–12.0)
Neutro Abs: 4.6 10*3/uL (ref 1.4–7.7)
Neutrophils Relative %: 58.6 % (ref 43.0–77.0)
Platelets: 232 10*3/uL (ref 150.0–400.0)
RBC: 4.45 Mil/uL (ref 4.22–5.81)
RDW: 14.8 % (ref 11.5–15.5)
WBC: 7.8 10*3/uL (ref 4.0–10.5)

## 2020-04-13 LAB — COMPREHENSIVE METABOLIC PANEL
ALT: 33 U/L (ref 0–53)
AST: 23 U/L (ref 0–37)
Albumin: 3.9 g/dL (ref 3.5–5.2)
Alkaline Phosphatase: 49 U/L (ref 39–117)
BUN: 15 mg/dL (ref 6–23)
CO2: 28 mEq/L (ref 19–32)
Calcium: 8.7 mg/dL (ref 8.4–10.5)
Chloride: 105 mEq/L (ref 96–112)
Creatinine, Ser: 1.2 mg/dL (ref 0.40–1.50)
GFR: 62.35 mL/min (ref >60.00)
Glucose, Bld: 94 mg/dL (ref 70–99)
Potassium: 4.3 mEq/L (ref 3.5–5.1)
Sodium: 140 mEq/L (ref 135–145)
Total Bilirubin: 0.4 mg/dL (ref 0.2–1.2)
Total Protein: 6.3 g/dL (ref 6.0–8.3)

## 2020-04-13 LAB — HEMOGLOBIN A1C: Hgb A1c MFr Bld: 6.1 % (ref 4.6–6.5)

## 2020-04-13 LAB — PSA: PSA: 0.92 ng/mL (ref 0.10–4.00)

## 2020-04-13 LAB — TSH: TSH: 1.72 u[IU]/mL (ref 0.35–4.50)

## 2020-04-16 ENCOUNTER — Ambulatory Visit (INDEPENDENT_AMBULATORY_CARE_PROVIDER_SITE_OTHER): Payer: Managed Care, Other (non HMO) | Admitting: Family Medicine

## 2020-04-16 ENCOUNTER — Other Ambulatory Visit: Payer: Self-pay

## 2020-04-16 ENCOUNTER — Encounter: Payer: Self-pay | Admitting: Family Medicine

## 2020-04-16 VITALS — BP 132/86 | HR 89 | Temp 97.7°F | Ht 70.0 in | Wt 294.0 lb

## 2020-04-16 DIAGNOSIS — E7800 Pure hypercholesterolemia, unspecified: Secondary | ICD-10-CM | POA: Diagnosis not present

## 2020-04-16 DIAGNOSIS — Z0000 Encounter for general adult medical examination without abnormal findings: Secondary | ICD-10-CM | POA: Diagnosis not present

## 2020-04-16 DIAGNOSIS — I10 Essential (primary) hypertension: Secondary | ICD-10-CM | POA: Diagnosis not present

## 2020-04-16 DIAGNOSIS — R7303 Prediabetes: Secondary | ICD-10-CM

## 2020-04-16 DIAGNOSIS — F429 Obsessive-compulsive disorder, unspecified: Secondary | ICD-10-CM

## 2020-04-16 DIAGNOSIS — Z125 Encounter for screening for malignant neoplasm of prostate: Secondary | ICD-10-CM

## 2020-04-16 MED ORDER — ESCITALOPRAM OXALATE 20 MG PO TABS: 20.0000 mg | ORAL_TABLET | Freq: Every day | ORAL | 3 refills | Status: DC

## 2020-04-16 MED ORDER — SIMVASTATIN 20 MG PO TABS: 20.0000 mg | ORAL_TABLET | Freq: Every day | ORAL | 3 refills | Status: DC

## 2020-04-16 MED ORDER — LOTREL 5-10 MG PO CAPS: 1.0000 | ORAL_CAPSULE | Freq: Every day | ORAL | 0 refills | Status: DC

## 2020-04-16 NOTE — Assessment & Plan Note (Signed)
Disc goals for lipids and reasons to control them Rev last labs with pt Rev low sat fat diet in detail Controlled with simvastatin  HDL is baseline low  Encouraged more exercise and intake of omega 3

## 2020-04-16 NOTE — Assessment & Plan Note (Signed)
Clinically stable with lexapr

## 2020-04-16 NOTE — Assessment & Plan Note (Signed)
bp in fair control at this time  BP Readings from Last 1 Encounters:  04/16/20 132/86   No changes needed Most recent labs reviewed  Disc lifstyle change with low sodium diet and exercise

## 2020-04-16 NOTE — Progress Notes (Signed)
Subjective:    Patient ID: Jeffrey Hartman, male    DOB: December 18, 1960, 59 y.o.   MRN: [id number]  This visit occurred during the SARS-CoV-2 public health emergency.  Safety protocols were in place, including screening questions prior to the visit, additional usage of staff PPE, and extensive cleaning of exam room while observing appropriate contact time as indicated for disinfecting solutions.    HPI Here for health maintenance exam and to review chronic medical problems    Wt Readings from Last 3 Encounters:  04/16/20 294 lb (133.4 kg)  02/22/20 295 lb 12 oz (134.2 kg)  04/15/19 287 lb (130.2 kg)   42.18 kg/m  Flu shot-gets in the fall  covid-vaccinated in the spring Tdap 4/14  Had the shingrix vaccines    Colonoscopy 11/18 with 5 y recall   Prostate cancer screen Lab Results  Component Value Date   PSA 0.92 04/13/2020   PSA 1.02 04/12/2019   PSA 1.36 04/07/2018  no urinary symptoms  Nocturia 0-1 times    HTN bp is stable today  No cp or palpitations or headaches or edema  No side effects to medicines  BP Readings from Last 3 Encounters:  04/16/20 132/86  02/22/20 120/82  04/15/19 120/86     Pulse Readings from Last 3 Encounters:  04/16/20 89  02/22/20 82  04/15/19 92    Hyperlipidemia Lab Results  Component Value Date   CHOL 135 04/13/2020   CHOL 146 04/12/2019   CHOL 146 04/07/2018   Lab Results  Component Value Date   HDL 31.10 (L) 04/13/2020   HDL 31.20 (L) 04/12/2019   HDL 31.20 (L) 04/07/2018   Lab Results  Component Value Date   LDLCALC 80 04/13/2020   LDLCALC 85 04/12/2019   LDLCALC 90 04/07/2018   Lab Results  Component Value Date   TRIG 119.0 04/13/2020   TRIG 150.0 (H) 04/12/2019   TRIG 126.0 04/07/2018   Lab Results  Component Value Date   CHOLHDL 4 04/13/2020   CHOLHDL 5 04/12/2019   CHOLHDL 5 04/07/2018   Lab Results  Component Value Date   LDLDIRECT 109.0 09/21/2009   Taking simvastatin  Diet - has been a challenge  during the pandemic  Has a recumbent bike   He has options to get groceries safely -drive up     Elevated glucose level in the past Lab Results  Component Value Date   HGBA1C 6.1 04/13/2020  not a lot of sweets Too many potatoes   Patient Active Problem List   Diagnosis Date Noted  . Prediabetes 04/10/2020  . Insect bite of right thigh 02/22/2020  . Eczema 08/24/2015  . Low testosterone 11/09/2013  . Prostate cancer screening 11/02/2013  . Adenomatous colon polyp 07/28/2012  . Family history of malignant neoplasm of gastrointestinal tract 06/29/2012  . Anal fissure 06/29/2012  . Personal history of colonic polyps 06/29/2012  . Routine general medical examination at a health care facility 09/17/2011  . Morbid obesity ([place]) 01/01/2010  . Allergic rhinitis 10/13/2008  . Obstructive sleep apnea 08/11/2007  . Obsessive-compulsive disorder 01/21/2007  . ACTINIC SKIN DAMAGE 01/21/2007  . Hyperlipidemia 01/13/2007  . Essential hypertension 01/13/2007   Past Medical History:  Diagnosis Date  . Anxiety states   . Dermatophytosis of nail   . Obsessive-compulsive disorders   . Obstructive sleep apnea (adult) (pediatric)   . Other and unspecified hyperlipidemia   . Seasonal allergic rhinitis   . Unspecified dermatitis due to sun   .  Unspecified essential hypertension   . Unspecified sleep apnea    Past Surgical History:  Procedure Laterality Date  . COLONOSCOPY  11/02, 11/05   polyps  . CYSTECTOMY     cyst on hand   Social History   Tobacco Use  . Smoking status: Never Smoker  . Smokeless tobacco: Never Used  Vaping Use  . Vaping Use: Never used  Substance Use Topics  . Alcohol use: Yes    Alcohol/week: 0.0 standard drinks    Comment: occasionally,1 beer once month  . Drug use: No   Family History  Problem Relation Age of Onset  . Hypertension Father   . Diabetes type II Father   . Cancer - Other Father 33       Adrenal Cancer  . Colon cancer Mother 12  .  Prostate cancer Neg Hx    Allergies  Allergen Reactions  . Shellfish Allergy Shortness Of Breath and Other (See Comments)    Reaction=tunnel vision  . Amlodipine Besy-Benazepril Hcl     REACTION: Generic med did not adequatly control htnh  . Sertraline Hcl Nausea Only    REACTION: nausea, ED   Current Outpatient Medications on File Prior to Visit  Medication Sig Dispense Refill  . aspirin 81 MG tablet Take 81 mg by mouth daily.      . Azelastine HCl (ASTEPRO) 0.15 % SOLN Place 1 spray into both nostrils 2 (two) times daily. (Patient taking differently: Place 1 spray into both nostrils 2 (two) times daily as needed. ) 90 mL 3  . clomiPHENE (CLOMID) 50 MG tablet Take 50 mg by mouth at bedtime.    Mariane Baumgarten Sodium 100 MG capsule Take 100 mg by mouth daily.    Marland Kitchen Fexofenadine HCl (ALLEGRA PO) Take 1 tablet by mouth daily. As directed    . fish oil-omega-3 fatty acids 1000 MG capsule Take 1 g by mouth daily.     . fluticasone (FLONASE) 50 MCG/ACT nasal spray Place 2 sprays into both nostrils daily as needed. 48 g 3  . HYDROcodone-homatropine (HYCODAN) 5-1.5 MG/5ML syrup Take 5 mLs by mouth every 8 (eight) hours as needed for cough. 100 mL 0  . hydrocortisone (ANUSOL-HC) 25 MG suppository Place 1 suppository (25 mg total) rectally every evening. As needed for 7 days as needed for rectal pain or bleeding 14 suppository 2  . Melatonin 5 MG TABS Take 10 mg by mouth at bedtime.    . Multiple Vitamin (MULTIVITAMIN) capsule Take 1 capsule by mouth daily.    Marland Kitchen triamcinolone cream (KENALOG) 0.5 % Apply 1 application topically 2 (two) times daily. 30 g 0   No current facility-administered medications on file prior to visit.      Review of Systems  Constitutional: Negative for activity change, appetite change, fatigue, fever and unexpected weight change.  HENT: Negative for congestion, rhinorrhea, sore throat and trouble swallowing.   Eyes: Negative for pain, redness, itching and visual disturbance.   Respiratory: Negative for cough, chest tightness, shortness of breath and wheezing.   Cardiovascular: Negative for chest pain and palpitations.  Gastrointestinal: Negative for abdominal pain, blood in stool, constipation, diarrhea and nausea.  Endocrine: Negative for cold intolerance, heat intolerance, polydipsia and polyuria.  Genitourinary: Negative for difficulty urinating, dysuria, frequency and urgency.  Musculoskeletal: Negative for arthralgias, joint swelling and myalgias.  Skin: Negative for pallor and rash.  Neurological: Negative for dizziness, tremors, weakness, numbness and headaches.  Hematological: Negative for adenopathy. Does not bruise/bleed easily.  Psychiatric/Behavioral:  Negative for decreased concentration and dysphoric mood. The patient is not nervous/anxious.        Objective:   Physical Exam Constitutional:      General: He is not in acute distress.    Appearance: Normal appearance. He is well-developed. He is obese. He is not ill-appearing or diaphoretic.  HENT:     Head: Normocephalic and atraumatic.     Right Ear: Tympanic membrane, ear canal and external ear normal.     Left Ear: Tympanic membrane, ear canal and external ear normal.     Nose: Nose normal. No congestion.     Mouth/Throat:     Mouth: Mucous membranes are moist.     Pharynx: Oropharynx is clear. No posterior oropharyngeal erythema.  Eyes:     General: No scleral icterus.       Right eye: No discharge.        Left eye: No discharge.     Conjunctiva/sclera: Conjunctivae normal.     Pupils: Pupils are equal, round, and reactive to light.  Neck:     Thyroid: No thyromegaly.     Vascular: No carotid bruit or JVD.  Cardiovascular:     Rate and Rhythm: Normal rate and regular rhythm.     Pulses: Normal pulses.     Heart sounds: Normal heart sounds. No gallop.   Pulmonary:     Effort: Pulmonary effort is normal. No respiratory distress.     Breath sounds: Normal breath sounds. No wheezing  or rales.     Comments: Good air exch Chest:     Chest wall: No tenderness.  Abdominal:     General: Bowel sounds are normal. There is no distension or abdominal bruit.     Palpations: Abdomen is soft. There is no mass.     Tenderness: There is no abdominal tenderness.     Hernia: No hernia is present.  Musculoskeletal:        General: No tenderness.     Cervical back: Normal range of motion and neck supple. No rigidity. No muscular tenderness.     Right lower leg: No edema.     Left lower leg: No edema.  Lymphadenopathy:     Cervical: No cervical adenopathy.  Skin:    General: Skin is warm and dry.     Coloration: Skin is not pale.     Findings: No erythema or rash.     Comments: Fair complexion Some lentigines  Neurological:     Mental Status: He is alert.     Cranial Nerves: No cranial nerve deficit.     Motor: No abnormal muscle tone.     Coordination: Coordination normal.     Gait: Gait normal.     Deep Tendon Reflexes: Reflexes are normal and symmetric. Reflexes normal.  Psychiatric:        Mood and Affect: Mood normal.        Cognition and Memory: Cognition and memory normal.     Comments: Pleasant            Assessment & Plan:   Problem List Items Addressed This Visit      Cardiovascular and Mediastinum   Essential hypertension    bp in fair control at this time  BP Readings from Last 1 Encounters:  04/16/20 132/86   No changes needed Most recent labs reviewed  Disc lifstyle change with low sodium diet and exercise        Relevant Medications   LOTREL 5-10 MG  capsule   simvastatin (ZOCOR) 20 MG tablet     Other   Hyperlipidemia    Disc goals for lipids and reasons to control them Rev last labs with pt Rev low sat fat diet in detail Controlled with simvastatin  HDL is baseline low  Encouraged more exercise and intake of omega 3       Relevant Medications   LOTREL 5-10 MG capsule   simvastatin (ZOCOR) 20 MG tablet   Morbid obesity (HCC)      Discussed how this problem influences overall health and the risks it imposes  Reviewed plan for weight loss with lower calorie diet (via better food choices and also portion control or program like weight watchers) and exercise building up to or more than 30 minutes 5 days per week including some aerobic activity   Pt does not seem motivated for change quite yet  Encouraged small changes at first-adding exercise/ cutting back processed carbs      Obsessive-compulsive disorder    Clinically stable with lexapr      Relevant Medications   escitalopram (LEXAPRO) 20 MG tablet   Routine general medical examination at a health care facility - Primary    Reviewed health habits including diet and exercise and skin cancer prevention Reviewed appropriate screening tests for age  Also reviewed health mt list, fam hx and immunization status , as well as social and family history    See HPI Labs reviewed Wt loss effort encouraged Plans to get flu shot in fall Has had shingrix vaccine psa stable        Prostate cancer screening    No clinical symptoms/changes Lab Results  Component Value Date   PSA 0.92 04/13/2020   PSA 1.02 04/12/2019   PSA 1.36 04/07/2018          Prediabetes    Lab Results  Component Value Date   HGBA1C 6.1 04/13/2020   disc imp of low glycemic diet and wt loss to prevent DM2

## 2020-04-16 NOTE — Patient Instructions (Addendum)
Get a flu shot this fall -sept/oct   Use the exercise bike 5 or more days per week- 30 or more minutes   For diabetes prevention  Try to get most of your carbohydrates from produce (with the exception of white potatoes)  Eat less bread/pasta/rice/snack foods/cereals/sweets and other items from the middle of the grocery store (processed carbs)  Take care of yourself !

## 2020-04-16 NOTE — Assessment & Plan Note (Signed)
Lab Results  Component Value Date   HGBA1C 6.1 04/13/2020   disc imp of low glycemic diet and wt loss to prevent DM2

## 2020-04-16 NOTE — Assessment & Plan Note (Signed)
No clinical symptoms/changes Lab Results  Component Value Date   PSA 0.92 04/13/2020   PSA 1.02 04/12/2019   PSA 1.36 04/07/2018

## 2020-04-16 NOTE — Assessment & Plan Note (Signed)
Discussed how this problem influences overall health and the risks it imposes  Reviewed plan for weight loss with lower calorie diet (via better food choices and also portion control or program like weight watchers) and exercise building up to or more than 30 minutes 5 days per week including some aerobic activity   Pt does not seem motivated for change quite yet  Encouraged small changes at first-adding exercise/ cutting back processed carbs

## 2020-04-16 NOTE — Assessment & Plan Note (Signed)
Reviewed health habits including diet and exercise and skin cancer prevention Reviewed appropriate screening tests for age  Also reviewed health mt list, fam hx and immunization status , as well as social and family history    See HPI Labs reviewed Wt loss effort encouraged Plans to get flu shot in fall Has had shingrix vaccine psa stable

## 2020-07-11 ENCOUNTER — Other Ambulatory Visit: Payer: Self-pay | Admitting: Family Medicine

## 2020-10-09 ENCOUNTER — Other Ambulatory Visit: Payer: Self-pay | Admitting: Family Medicine

## 2021-02-08 ENCOUNTER — Telehealth: Payer: Self-pay | Admitting: Gastroenterology

## 2021-02-08 NOTE — Telephone Encounter (Signed)
Spoke with patient, he states that he has noticed a "pinching pain" that started on Tuesday/Wednesday. He states that the pain is not coming from the perianal skin tag but it is close to it. Patient states that he was constipated earlier this week and noticed BRB once. He has not seen any blood since. He has been using Preparation H cream which helps but it does burn a little. Advised patient that it is possible that he has a fissure. Patient has been scheduled for a new patient appt with Dr. Havery Moros on Friday, 04/12/21 at 1:40 PM, since he has not been seen in 3 years. Offered patient an appt with an APP but he prefers to see Dr. Havery Moros. Patient would like to know if he can have a prescription for a cream that will help with the discomfort. Patient also wanted to know if he could be seen sooner, he does not want to wait until August for evaluation and then have to possibly wait longer for treatment. Please advise, thanks.

## 2021-02-08 NOTE — Telephone Encounter (Signed)
Patient called states he is currently hurting really bad from a perianal skin tag seeking advise.

## 2021-02-08 NOTE — Telephone Encounter (Signed)
Spoke with patient in regards to recommendations as outlined below. Patient would like to try Recticare, he has been advised to call us and let us know if the pain has not improved over the weekend. Patient requested that he be added to the cancellation list for a sooner appt with Dr. Havery Moros. Advised that if he changes his mind he can give [place] a call and we can get him scheduled or a sooner appt with an APP. Patient verbalized understanding and had no concerns at the end of the call.

## 2021-02-08 NOTE — Telephone Encounter (Signed)
Based on description of symptoms I suspect he likely has a fissure. He can try some Recticare cream OTC to see if that will provide some relief. Recommend he use a daily fiber supplement otherwise to keep stools soft. If pain not improved over the weekend he can call us back early in the week. Ideally would need to examine him to clarify what is causing his pain, prior to prescribing something like nitroglycerin or dilatiazem ointment. We can look at my schedule to see if I can work him into the clinic but I don't think I have many opportunities in the near future. The APPs could easily evaluate this to clarify what is going on.

## 2021-04-03 ENCOUNTER — Encounter: Payer: Self-pay | Admitting: Family Medicine

## 2021-04-04 NOTE — Telephone Encounter (Signed)
Left VM letting pt know Dr. Marliss Coots comments and also sent mychart letting pt know

## 2021-04-05 ENCOUNTER — Encounter: Payer: Self-pay | Admitting: Family Medicine

## 2021-04-05 ENCOUNTER — Ambulatory Visit (INDEPENDENT_AMBULATORY_CARE_PROVIDER_SITE_OTHER): Payer: Managed Care, Other (non HMO) | Admitting: Family Medicine

## 2021-04-05 ENCOUNTER — Other Ambulatory Visit: Payer: Self-pay

## 2021-04-05 DIAGNOSIS — W57XXXA Bitten or stung by nonvenomous insect and other nonvenomous arthropods, initial encounter: Secondary | ICD-10-CM | POA: Diagnosis not present

## 2021-04-05 DIAGNOSIS — S80861A Insect bite (nonvenomous), right lower leg, initial encounter: Secondary | ICD-10-CM | POA: Insufficient documentation

## 2021-04-05 MED ORDER — TRIAMCINOLONE ACETONIDE 0.5 % EX CREA: 1.0000 "application " | TOPICAL_CREAM | Freq: Two times a day (BID) | CUTANEOUS | 0 refills | Status: DC

## 2021-04-05 NOTE — Patient Instructions (Addendum)
Whatever bit you caused some bruising but it does not look infected   Keep it cool /ice  Watch for pain or increase in size or rash  Use the new triamcinolone twice daily  Allegra  If sever itch at night -benadryl  Lidocaine topically is ok also   Update if not starting to improve in a week or if worsening    Use insect repellent in the future (even on clothes)

## 2021-04-05 NOTE — Assessment & Plan Note (Signed)
Bitten Monday pm most likely through or under jeans Worse redness /itch yesterday Some improvement today No pain or swelling  Primarily red appearing ecchymosis  Reassuring  Unsure if spider or vespid or other  Watch closely  Use insect repellent  Triamcinolone 0.5 % bid  Cold compress Antihistamine prn  Update if not starting to improve in a week or if worsening  -incl pain/swelling or inc in size

## 2021-04-05 NOTE — Progress Notes (Signed)
Subjective:    Patient ID: Jeffrey Hartman, male    DOB: 08/16/1961, 60 y.o.   MRN: [id number]  This visit occurred during the SARS-CoV-2 public health emergency.  Safety protocols were in place, including screening questions prior to the visit, additional usage of staff PPE, and extensive cleaning of exam room while observing appropriate contact time as indicated for disinfecting solutions.   HPI Pt presents for c/o insect bite   Wt Readings from Last 3 Encounters:  04/05/21 293 lb (132.9 kg)  04/16/20 294 lb (133.4 kg)  02/22/20 295 lb 12 oz (134.2 kg)   42.04 kg/m   Pt called yesterday stating he worked in the yard Monday evening and thinks an insect bit him (though he had jeans on)  Started to itch mid week (behind R knee)  Heat makes it worse   Improved today   Did not see the insect  Did not pull off a tick   Used old triamcinolone Takes allergra at night   Tdap 4/14  Patient Active Problem List   Diagnosis Date Noted   Insect bite 04/05/2021   Prediabetes 04/10/2020   Insect bite of right thigh 02/22/2020   Eczema 08/24/2015   Low testosterone 11/09/2013   Prostate cancer screening 11/02/2013   Adenomatous colon polyp 07/28/2012   Family history of malignant neoplasm of gastrointestinal tract 06/29/2012   Anal fissure 06/29/2012   Personal history of colonic polyps 06/29/2012   Routine general medical examination at a health care facility 09/17/2011   Morbid obesity ([place]) 01/01/2010   Allergic rhinitis 10/13/2008   Obstructive sleep apnea 08/11/2007   Obsessive-compulsive disorder 01/21/2007   ACTINIC SKIN DAMAGE 01/21/2007   Hyperlipidemia 01/13/2007   Essential hypertension 01/13/2007   Past Medical History:  Diagnosis Date   Anxiety states    Dermatophytosis of nail    Obsessive-compulsive disorders    Obstructive sleep apnea (adult) (pediatric)    Other and unspecified hyperlipidemia    Seasonal allergic rhinitis    Unspecified dermatitis due  to sun    Unspecified essential hypertension    Unspecified sleep apnea    Past Surgical History:  Procedure Laterality Date   COLONOSCOPY  11/02, 11/05   polyps   CYSTECTOMY     cyst on hand   Social History   Tobacco Use   Smoking status: Never   Smokeless tobacco: Never  Vaping Use   Vaping Use: Never used  Substance Use Topics   Alcohol use: Yes    Alcohol/week: 0.0 standard drinks    Comment: occasionally,1 beer once month   Drug use: No   Family History  Problem Relation Age of Onset   Hypertension Father    Diabetes type II Father    Cancer - Other Father 73       Adrenal Cancer   Colon cancer Mother 25   Prostate cancer Neg Hx    Allergies  Allergen Reactions   Shellfish Allergy Shortness Of Breath and Other (See Comments)    Reaction=tunnel vision   Amlodipine Besy-Benazepril Hcl     REACTION: Generic med did not adequatly control htnh   Sertraline Hcl Nausea Only    REACTION: nausea, ED   Current Outpatient Medications on File Prior to Visit  Medication Sig Dispense Refill   aspirin 81 MG tablet Take 81 mg by mouth daily.       Azelastine HCl (ASTEPRO) 0.15 % SOLN Place 1 spray into both nostrils 2 (two) times daily. (Patient taking differently:  Place 1 spray into both nostrils 2 (two) times daily as needed.) 90 mL 3   clomiPHENE (CLOMID) 50 MG tablet Take 50 mg by mouth at bedtime.     Docusate Sodium 100 MG capsule Take 100 mg by mouth daily.     escitalopram (LEXAPRO) 20 MG tablet Take 1 tablet (20 mg total) by mouth daily. 90 tablet 3   Fexofenadine HCl (ALLEGRA PO) Take 1 tablet by mouth daily. As directed     fish oil-omega-3 fatty acids 1000 MG capsule Take 1 g by mouth daily.      fluticasone (FLONASE) 50 MCG/ACT nasal spray Place 2 sprays into both nostrils daily as needed. 48 g 3   HYDROcodone-homatropine (HYCODAN) 5-1.5 MG/5ML syrup Take 5 mLs by mouth every 8 (eight) hours as needed for cough. 100 mL 0   hydrocortisone (ANUSOL-HC) 25 MG  suppository Place 1 suppository (25 mg total) rectally every evening. As needed for 7 days as needed for rectal pain or bleeding 14 suppository 2   LOTREL 5-10 MG capsule TAKE 1 CAPSULE DAILY 90 capsule 2   Melatonin 5 MG TABS Take 10 mg by mouth at bedtime.     Multiple Vitamin (MULTIVITAMIN) capsule Take 1 capsule by mouth daily.     simvastatin (ZOCOR) 20 MG tablet Take 1 tablet (20 mg total) by mouth daily. 90 tablet 3   No current facility-administered medications on file prior to visit.     Review of Systems  Constitutional:  Negative for activity change, appetite change, fatigue, fever and unexpected weight change.  HENT:  Negative for congestion, rhinorrhea, sore throat and trouble swallowing.   Eyes:  Negative for pain, redness, itching and visual disturbance.  Respiratory:  Negative for cough, chest tightness, shortness of breath and wheezing.   Cardiovascular:  Negative for chest pain and palpitations.  Gastrointestinal:  Negative for abdominal pain, blood in stool, constipation, diarrhea and nausea.  Endocrine: Negative for cold intolerance, heat intolerance, polydipsia and polyuria.  Genitourinary:  Negative for difficulty urinating, dysuria, frequency and urgency.  Musculoskeletal:  Negative for arthralgias, joint swelling and myalgias.  Skin:  Positive for wound. Negative for pallor.  Neurological:  Negative for dizziness, tremors, weakness, numbness and headaches.  Hematological:  Negative for adenopathy. Does not bruise/bleed easily.  Psychiatric/Behavioral:  Negative for decreased concentration and dysphoric mood. The patient is not nervous/anxious.       Objective:   Physical Exam Constitutional:      General: He is not in acute distress.    Appearance: Normal appearance. He is obese. He is not ill-appearing.  Eyes:     General: No scleral icterus.       Right eye: No discharge.        Left eye: No discharge.     Conjunctiva/sclera: Conjunctivae normal.      Pupils: Pupils are equal, round, and reactive to light.  Cardiovascular:     Rate and Rhythm: Normal rate and regular rhythm.  Pulmonary:     Effort: Pulmonary effort is normal. No respiratory distress.  Musculoskeletal:     Cervical back: Neck supple.     Right lower leg: No edema.     Left lower leg: No edema.  Lymphadenopathy:     Cervical: No cervical adenopathy.  Skin:    Comments: 2 by 3 cm irregular area of superficial erythematous ecchymosis on back of R knee  Small streak of bruise follows the crease No lesions or skin breakdown  No drainage or excoriation  No swelling   Neurological:     Mental Status: He is alert.     Sensory: No sensory deficit.  Psychiatric:        Mood and Affect: Mood normal.          Assessment & Plan:   Problem List Items Addressed This Visit       Musculoskeletal and Integument   Insect bite of right lower extremity    Bitten Monday pm most likely through or under jeans Worse redness /itch yesterday Some improvement today No pain or swelling  Primarily red appearing ecchymosis  Reassuring  Unsure if spider or vespid or other  Watch closely  Use insect repellent  Triamcinolone 0.5 % bid  Cold compress Antihistamine prn  Update if not starting to improve in a week or if worsening  -incl pain/swelling or inc in size        Relevant Medications   triamcinolone cream (KENALOG) 0.5 %

## 2021-04-07 ENCOUNTER — Other Ambulatory Visit: Payer: Self-pay | Admitting: Family Medicine

## 2021-04-09 ENCOUNTER — Telehealth: Payer: Self-pay | Admitting: Family Medicine

## 2021-04-09 DIAGNOSIS — I10 Essential (primary) hypertension: Secondary | ICD-10-CM

## 2021-04-09 DIAGNOSIS — Z125 Encounter for screening for malignant neoplasm of prostate: Secondary | ICD-10-CM

## 2021-04-09 DIAGNOSIS — R7303 Prediabetes: Secondary | ICD-10-CM

## 2021-04-09 DIAGNOSIS — E7800 Pure hypercholesterolemia, unspecified: Secondary | ICD-10-CM

## 2021-04-09 NOTE — Telephone Encounter (Signed)
-----   Message from Cloyd Stagers, RT sent at 03/25/2021 10:27 AM EDT ----- Regarding: Lab Orders for Wednesday 8.10.2022 Please place lab orders for Wednesday 8.10.2022, office visit for physical on Wednesday 8.17.2022 Thank you, Dyke Maes RT(R)

## 2021-04-10 ENCOUNTER — Other Ambulatory Visit: Payer: Self-pay

## 2021-04-10 ENCOUNTER — Other Ambulatory Visit (INDEPENDENT_AMBULATORY_CARE_PROVIDER_SITE_OTHER): Payer: Managed Care, Other (non HMO)

## 2021-04-10 DIAGNOSIS — E7800 Pure hypercholesterolemia, unspecified: Secondary | ICD-10-CM

## 2021-04-10 DIAGNOSIS — Z125 Encounter for screening for malignant neoplasm of prostate: Secondary | ICD-10-CM

## 2021-04-10 DIAGNOSIS — R7303 Prediabetes: Secondary | ICD-10-CM | POA: Diagnosis not present

## 2021-04-10 DIAGNOSIS — I10 Essential (primary) hypertension: Secondary | ICD-10-CM

## 2021-04-10 LAB — COMPREHENSIVE METABOLIC PANEL
ALT: 32 U/L (ref 0–53)
AST: 25 U/L (ref 0–37)
Albumin: 4 g/dL (ref 3.5–5.2)
Alkaline Phosphatase: 45 U/L (ref 39–117)
BUN: 17 mg/dL (ref 6–23)
CO2: 25 mEq/L (ref 19–32)
Calcium: 9.1 mg/dL (ref 8.4–10.5)
Chloride: 104 mEq/L (ref 96–112)
Creatinine, Ser: 1.22 mg/dL (ref 0.40–1.50)
GFR: 65.48 mL/min (ref >60.00)
Glucose, Bld: 91 mg/dL (ref 70–99)
Potassium: 4.4 mEq/L (ref 3.5–5.1)
Sodium: 139 mEq/L (ref 135–145)
Total Bilirubin: 0.4 mg/dL (ref 0.2–1.2)
Total Protein: 6.7 g/dL (ref 6.0–8.3)

## 2021-04-10 LAB — LIPID PANEL
Cholesterol: 150 mg/dL (ref 0–200)
HDL: 31.6 mg/dL — ABNORMAL LOW (ref >39.00)
LDL Cholesterol: 92 mg/dL (ref 0–99)
NonHDL: 118.33
Total CHOL/HDL Ratio: 5
Triglycerides: 134 mg/dL (ref 0.0–149.0)
VLDL: 26.8 mg/dL (ref 0.0–40.0)

## 2021-04-10 LAB — CBC WITH DIFFERENTIAL/PLATELET
Basophils Absolute: 0 10*3/uL (ref 0.0–0.1)
Basophils Relative: 0.5 % (ref 0.0–3.0)
Eosinophils Absolute: 0.2 10*3/uL (ref 0.0–0.7)
Eosinophils Relative: 3.1 % (ref 0.0–5.0)
HCT: 39.5 % (ref 39.0–52.0)
Hemoglobin: 13.1 g/dL (ref 13.0–17.0)
Lymphocytes Relative: 19 % (ref 12.0–46.0)
Lymphs Abs: 1.5 10*3/uL (ref 0.7–4.0)
MCHC: 33.2 g/dL (ref 30.0–36.0)
MCV: 87.7 fl (ref 78.0–100.0)
Monocytes Absolute: 0.7 10*3/uL (ref 0.1–1.0)
Monocytes Relative: 9.1 % (ref 3.0–12.0)
Neutro Abs: 5.3 10*3/uL (ref 1.4–7.7)
Neutrophils Relative %: 68.3 % (ref 43.0–77.0)
Platelets: 217 10*3/uL (ref 150.0–400.0)
RBC: 4.51 Mil/uL (ref 4.22–5.81)
RDW: 15 % (ref 11.5–15.5)
WBC: 7.7 10*3/uL (ref 4.0–10.5)

## 2021-04-10 LAB — PSA: PSA: 0.63 ng/mL (ref 0.10–4.00)

## 2021-04-10 LAB — HEMOGLOBIN A1C: Hgb A1c MFr Bld: 6 % (ref 4.6–6.5)

## 2021-04-10 LAB — TSH: TSH: 1.8 u[IU]/mL (ref 0.35–5.50)

## 2021-04-12 ENCOUNTER — Ambulatory Visit: Payer: Managed Care, Other (non HMO) | Admitting: Gastroenterology

## 2021-04-17 ENCOUNTER — Other Ambulatory Visit: Payer: Self-pay

## 2021-04-17 ENCOUNTER — Encounter: Payer: Self-pay | Admitting: Family Medicine

## 2021-04-17 ENCOUNTER — Ambulatory Visit (INDEPENDENT_AMBULATORY_CARE_PROVIDER_SITE_OTHER): Payer: Managed Care, Other (non HMO) | Admitting: Family Medicine

## 2021-04-17 VITALS — BP 128/78 | HR 96 | Temp 97.2°F | Ht 69.5 in | Wt 293.5 lb

## 2021-04-17 DIAGNOSIS — Z800 Family history of malignant neoplasm of digestive organs: Secondary | ICD-10-CM

## 2021-04-17 DIAGNOSIS — R7303 Prediabetes: Secondary | ICD-10-CM

## 2021-04-17 DIAGNOSIS — G4700 Insomnia, unspecified: Secondary | ICD-10-CM | POA: Insufficient documentation

## 2021-04-17 DIAGNOSIS — R7989 Other specified abnormal findings of blood chemistry: Secondary | ICD-10-CM | POA: Diagnosis not present

## 2021-04-17 DIAGNOSIS — I10 Essential (primary) hypertension: Secondary | ICD-10-CM | POA: Diagnosis not present

## 2021-04-17 DIAGNOSIS — M542 Cervicalgia: Secondary | ICD-10-CM | POA: Insufficient documentation

## 2021-04-17 DIAGNOSIS — Z125 Encounter for screening for malignant neoplasm of prostate: Secondary | ICD-10-CM

## 2021-04-17 DIAGNOSIS — H6123 Impacted cerumen, bilateral: Secondary | ICD-10-CM

## 2021-04-17 DIAGNOSIS — Z0000 Encounter for general adult medical examination without abnormal findings: Secondary | ICD-10-CM

## 2021-04-17 DIAGNOSIS — F429 Obsessive-compulsive disorder, unspecified: Secondary | ICD-10-CM

## 2021-04-17 DIAGNOSIS — G4709 Other insomnia: Secondary | ICD-10-CM

## 2021-04-17 DIAGNOSIS — E7800 Pure hypercholesterolemia, unspecified: Secondary | ICD-10-CM | POA: Diagnosis not present

## 2021-04-17 NOTE — Assessment & Plan Note (Signed)
Discussed sleep hygiene  Melatonin prn Has used zquil as well  Encouraged use of meditation app Continues to use cpap

## 2021-04-17 NOTE — Progress Notes (Signed)
Subjective:    Patient ID: Jeffrey Hartman, male    DOB: December 01, 1960, 60 y.o.   MRN: [id number]  This visit occurred during the SARS-CoV-2 public health emergency.  Safety protocols were in place, including screening questions prior to the visit, additional usage of staff PPE, and extensive cleaning of exam room while observing appropriate contact time as indicated for disinfecting solutions.   HPI Here for health maintenance exam and to review chronic medical problems    Wt Readings from Last 3 Encounters:  04/17/21 293 lb 8 oz (133.1 kg)  04/05/21 293 lb (132.9 kg)  04/16/20 294 lb (133.4 kg)   42.72 kg/m  Doing well  Recent bug bite got better  - did not end up needing the cream  Working  Niece is getting married in fall   Self care- trying   Exercise -some walking when weather permits Has a recumbent bike    Covid immunized Booster status- had it  Flu shot-fall  Zoster status-had shingrix series Tdap 4/14  Colonoscopy 11/18 - 5 y recall  Mother had colon cancer at age 82  He has personal h/o polyps  No bowel changes   Prostate health  Lab Results  Component Value Date   PSA 0.63 04/10/2021   PSA 0.92 04/13/2020   PSA 1.02 04/12/2019   Treated for low testosterone with clomid Doing well  Level was nl and feels better (more energy and more motivated)   No urinary changes  Nocturia 0-1 at most  No complaints     HTN bp is stable today  No cp or palpitations or headaches or edema  No side effects to medicines  BP Readings from Last 3 Encounters:  04/17/21 128/78  04/05/21 (!) 142/82  04/16/20 132/86    Pulse Readings from Last 3 Encounters:  04/17/21 96  04/05/21 70  04/16/20 89   Has OSA with cpap Helps a lot , cannot sleep without it    Hyperlipidemia Lab Results  Component Value Date   CHOL 150 04/10/2021   CHOL 135 04/13/2020   CHOL 146 04/12/2019   Lab Results  Component Value Date   HDL 31.60 (L) 04/10/2021   HDL 31.10 (L)  04/13/2020   HDL 31.20 (L) 04/12/2019   Lab Results  Component Value Date   LDLCALC 92 04/10/2021   LDLCALC 80 04/13/2020   LDLCALC 85 04/12/2019   Lab Results  Component Value Date   TRIG 134.0 04/10/2021   TRIG 119.0 04/13/2020   TRIG 150.0 (H) 04/12/2019   Lab Results  Component Value Date   CHOLHDL 5 04/10/2021   CHOLHDL 4 04/13/2020   CHOLHDL 5 04/12/2019   Lab Results  Component Value Date   LDLDIRECT 109.0 09/21/2009  Needs to exercise  Taking omega 3 fish oil  Also eats fish when he can (white fish)   Takes simvastatin 20 mg daily  No missed doses   No changes in diet  Avoids fatty food and red meat     Mood with h/o OCD Takes lexapro 20 mg daily  Still works well  His OCD improved when he moved to a new house  Less frequent checking of things   Prediabetes Lab Results  Component Value Date   HGBA1C 6.0 04/10/2021   Down from 6.1 No excess thirst   Lab Results  Component Value Date   CREATININE 1.22 04/10/2021   BUN 17 04/10/2021   NA 139 04/10/2021   K 4.4 04/10/2021   CL  104 04/10/2021   CO2 25 04/10/2021   Lab Results  Component Value Date   ALT 32 04/10/2021   AST 25 04/10/2021   ALKPHOS 45 04/10/2021   BILITOT 0.4 04/10/2021   Glucose 91 Lab Results  Component Value Date   WBC 7.7 04/10/2021   HGB 13.1 04/10/2021   HCT 39.5 04/10/2021   MCV 87.7 04/10/2021   PLT 217.0 04/10/2021   Lab Results  Component Value Date   TSH 1.80 04/10/2021     Drinks some diet sport drinks  Less kidney stones   Some hearing issues  Wax build up   Some sleep issues-intermittent  Once per month cannot sleep for several months Uses kirkland's sleep aide , and also melatonin  Has not tried CBD No caffeine past am   Years ago had L neck and shoulder pain  Was seen by orthopedics  Given gabapentin and methocarbamol  Thumb and index finger go numb  Comes and goes-more bothersome lately (? If he needs to go back)  Still has some  medication    Patient Active Problem List   Diagnosis Date Noted   Insomnia 04/17/2021   Prediabetes 04/10/2020   Eczema 08/24/2015   Cerumen impaction 06/16/2014   Low testosterone 11/09/2013   Prostate cancer screening 11/02/2013   Adenomatous colon polyp 07/28/2012   Family history of malignant neoplasm of gastrointestinal tract 06/29/2012   Anal fissure 06/29/2012   Personal history of colonic polyps 06/29/2012   Routine general medical examination at a health care facility 09/17/2011   Morbid obesity ([place]) 01/01/2010   Allergic rhinitis 10/13/2008   Obstructive sleep apnea 08/11/2007   Obsessive-compulsive disorder 01/21/2007   ACTINIC SKIN DAMAGE 01/21/2007   Hyperlipidemia 01/13/2007   Essential hypertension 01/13/2007   Past Medical History:  Diagnosis Date   Anxiety states    Dermatophytosis of nail    Obsessive-compulsive disorders    Obstructive sleep apnea (adult) (pediatric)    Other and unspecified hyperlipidemia    Seasonal allergic rhinitis    Unspecified dermatitis due to sun    Unspecified essential hypertension    Unspecified sleep apnea    Past Surgical History:  Procedure Laterality Date   COLONOSCOPY  11/02, 11/05   polyps   CYSTECTOMY     cyst on hand   Social History   Tobacco Use   Smoking status: Never   Smokeless tobacco: Never  Vaping Use   Vaping Use: Never used  Substance Use Topics   Alcohol use: Yes    Alcohol/week: 0.0 standard drinks    Comment: occasionally,1 beer once month   Drug use: No   Family History  Problem Relation Age of Onset   Hypertension Father    Diabetes type II Father    Cancer - Other Father 92       Adrenal Cancer   Colon cancer Mother 69   Prostate cancer Neg Hx    Allergies  Allergen Reactions   Shellfish Allergy Shortness Of Breath and Other (See Comments)    Reaction=tunnel vision   Amlodipine Besy-Benazepril Hcl     REACTION: Generic med did not adequatly control htnh   Sertraline Hcl  Nausea Only    REACTION: nausea, ED   Current Outpatient Medications on File Prior to Visit  Medication Sig Dispense Refill   aspirin 81 MG tablet Take 81 mg by mouth daily.       Azelastine HCl (ASTEPRO) 0.15 % SOLN Place 1 spray into both nostrils 2 (two) times  daily. (Patient taking differently: Place 1 spray into both nostrils 2 (two) times daily as needed.) 90 mL 3   clomiPHENE (CLOMID) 50 MG tablet Take 50 mg by mouth at bedtime.     Docusate Sodium 100 MG capsule Take 100 mg by mouth daily.     escitalopram (LEXAPRO) 20 MG tablet TAKE 1 TABLET DAILY 90 tablet 1   Fexofenadine HCl (ALLEGRA PO) Take 1 tablet by mouth daily. As directed     fish oil-omega-3 fatty acids 1000 MG capsule Take 1 g by mouth daily.      fluticasone (FLONASE) 50 MCG/ACT nasal spray Place 2 sprays into both nostrils daily as needed. 48 g 3   hydrocortisone (ANUSOL-HC) 25 MG suppository Place 1 suppository (25 mg total) rectally every evening. As needed for 7 days as needed for rectal pain or bleeding 14 suppository 2   LOTREL 5-10 MG capsule TAKE 1 CAPSULE DAILY 90 capsule 2   Melatonin 5 MG TABS Take 10 mg by mouth at bedtime.     Multiple Vitamin (MULTIVITAMIN) capsule Take 1 capsule by mouth daily.     simvastatin (ZOCOR) 20 MG tablet TAKE 1 TABLET DAILY 90 tablet 1   triamcinolone cream (KENALOG) 0.5 % Apply 1 application topically 2 (two) times daily. To affected area 30 g 0   No current facility-administered medications on file prior to visit.    Review of Systems  Constitutional:  Negative for activity change, appetite change, fatigue, fever and unexpected weight change.  HENT:  Negative for congestion, rhinorrhea, sore throat and trouble swallowing.   Eyes:  Negative for pain, redness, itching and visual disturbance.  Respiratory:  Negative for cough, chest tightness, shortness of breath and wheezing.   Cardiovascular:  Negative for chest pain and palpitations.  Gastrointestinal:  Negative for  abdominal pain, blood in stool, constipation, diarrhea and nausea.  Endocrine: Negative for cold intolerance, heat intolerance, polydipsia and polyuria.  Genitourinary:  Negative for difficulty urinating, dysuria, frequency and urgency.  Musculoskeletal:  Positive for neck pain. Negative for arthralgias, joint swelling and myalgias.  Skin:  Negative for pallor and rash.  Neurological:  Negative for dizziness, tremors, weakness, numbness and headaches.       Occ numbness of fingers of L hand in conjunction with neck pain   Hematological:  Negative for adenopathy. Does not bruise/bleed easily.  Psychiatric/Behavioral:  Positive for sleep disturbance. Negative for decreased concentration and dysphoric mood. The patient is not nervous/anxious.       Objective:   Physical Exam Constitutional:      General: He is not in acute distress.    Appearance: Normal appearance. He is well-developed. He is obese. He is not ill-appearing or diaphoretic.  HENT:     Head: Normocephalic and atraumatic.     Right Ear: Tympanic membrane, ear canal and external ear normal. There is impacted cerumen.     Left Ear: Tympanic membrane, ear canal and external ear normal. There is impacted cerumen.     Nose: Nose normal. No congestion.     Mouth/Throat:     Mouth: Mucous membranes are moist.     Pharynx: Oropharynx is clear. No posterior oropharyngeal erythema.  Eyes:     General: No scleral icterus.       Right eye: No discharge.        Left eye: No discharge.     Conjunctiva/sclera: Conjunctivae normal.     Pupils: Pupils are equal, round, and reactive to light.  Neck:  Thyroid: No thyromegaly.     Vascular: No carotid bruit or JVD.  Cardiovascular:     Rate and Rhythm: Regular rhythm. Tachycardia present.     Pulses: Normal pulses.     Heart sounds: Normal heart sounds.    No gallop.  Pulmonary:     Effort: Pulmonary effort is normal. No respiratory distress.     Breath sounds: Normal breath sounds.  No wheezing or rales.     Comments: Good air exch Chest:     Chest wall: No tenderness.  Abdominal:     General: Bowel sounds are normal. There is no distension or abdominal bruit.     Palpations: Abdomen is soft. There is no mass.     Tenderness: There is no abdominal tenderness.     Hernia: No hernia is present.  Musculoskeletal:        General: No swelling or tenderness.     Cervical back: Normal range of motion and neck supple. No rigidity or tenderness. No muscular tenderness.     Right lower leg: No edema.     Left lower leg: No edema.  Lymphadenopathy:     Cervical: No cervical adenopathy.  Skin:    General: Skin is warm and dry.     Coloration: Skin is not pale.     Findings: No erythema or rash.     Comments: Solar lentigines diffusely Some brown nevi on trunk  Neurological:     Mental Status: He is alert.     Cranial Nerves: No cranial nerve deficit.     Motor: No abnormal muscle tone.     Coordination: Coordination normal.     Gait: Gait normal.     Deep Tendon Reflexes: Reflexes are normal and symmetric. Reflexes normal.  Psychiatric:        Mood and Affect: Mood normal.        Cognition and Memory: Cognition and memory normal.          Assessment & Plan:   Problem List Items Addressed This Visit       Cardiovascular and Mediastinum   Essential hypertension    bp in fair control at this time  BP Readings from Last 1 Encounters:  04/17/21 128/78  No changes needed Most recent labs reviewed  Disc lifstyle change with low sodium diet and exercise  Plan to continue lotrel 5-10 mg daily          Nervous and Auditory   Cerumen impaction    Bilateral with some hearing loss  Adv use of debrox 2-3 times weekly then f/u in 1 mo for irrigation  inst to call if ear pain or any new symptoms         Other   Hyperlipidemia    Disc goals for lipids and reasons to control them Rev last labs with pt Rev low sat fat diet in detail LDL up dlt to 92 HDL  remains low  Disc omega 3 supplement and exercise for this  Plan to continue simvastatin 20 mg daily      Morbid obesity ([place])    Discussed how this problem influences overall health and the risks it imposes  Reviewed plan for weight loss with lower calorie diet (via better food choices and also portion control or program like weight watchers) and exercise building up to or more than 30 minutes 5 days per week including some aerobic activity   Offered referral to the healthy weight and wellness center in the future  Obsessive-compulsive disorder    Doing well with lexapro  Reviewed stressors/ coping techniques/symptoms/ support sources/ tx options and side effects in detail today  Less stress since completing move to a new house      Routine general medical examination at a health care facility - Primary    Reviewed health habits including diet and exercise and skin cancer prevention Reviewed appropriate screening tests for age  Also reviewed health mt list, fam hx and immunization status , as well as social and family history   See HPI Labs reviewed  Encouraged use of exercise bike  Enc flu shot in the fall wih covid booster Colonoscopy utd  psa stable        Family history of malignant neoplasm of gastrointestinal tract    Pt gets colonoscopy every 5 y  Next due 07/2022      Prostate cancer screening    Lab Results  Component Value Date   PSA 0.63 04/10/2021   PSA 0.92 04/13/2020   PSA 1.02 04/12/2019   No family hx of prostate cancer No urinary changes Continues f/u with urology for testosterone def      Low testosterone    Continues clomid treatment from urology  Doing well       Prediabetes    Lab Results  Component Value Date   HGBA1C 6.0 04/10/2021  stable disc imp of low glycemic diet and wt loss to prevent DM2        Insomnia    Discussed sleep hygiene  Melatonin prn Has used zquil as well  Encouraged use of meditation app Continues  to use cpap      Neck pain    Left neck pain -since 2018  Intermittent Saw ortho (Dr Reather [facility] practice)  Per pt -bone spurs Some radiculopathy- numbness in L hand  Has used methocarbamol and gabapentin in the past  Sent for records to review

## 2021-04-17 NOTE — Assessment & Plan Note (Signed)
Continues clomid treatment from urology  Doing well

## 2021-04-17 NOTE — Assessment & Plan Note (Signed)
Left neck pain -since 2018  Intermittent [facility] ortho (Dr Reather Littler practice)  Per pt -bone spurs Some radiculopathy- numbness in L hand  Has used methocarbamol and gabapentin in the past  Sent for records to review

## 2021-04-17 NOTE — Assessment & Plan Note (Signed)
Discussed how this problem influences overall health and the risks it imposes  Reviewed plan for weight loss with lower calorie diet (via better food choices and also portion control or program like weight watchers) and exercise building up to or more than 30 minutes 5 days per week including some aerobic activity   Offered referral to the healthy weight and wellness center in the future

## 2021-04-17 NOTE — Assessment & Plan Note (Signed)
Lab Results  Component Value Date   PSA 0.63 04/10/2021   PSA 0.92 04/13/2020   PSA 1.02 04/12/2019    No family hx of prostate cancer No urinary changes Continues f/u with urology for testosterone def

## 2021-04-17 NOTE — Assessment & Plan Note (Signed)
Bilateral with some hearing loss  Adv use of debrox 2-3 times weekly then f/u in 1 mo for irrigation  inst to call if ear pain or any new symptoms

## 2021-04-17 NOTE — Assessment & Plan Note (Signed)
Doing well with lexapro  Reviewed stressors/ coping techniques/symptoms/ support sources/ tx options and side effects in detail today  Less stress since completing move to a new house

## 2021-04-17 NOTE — Assessment & Plan Note (Signed)
Pt gets colonoscopy every 5 y  Next due 07/2022

## 2021-04-17 NOTE — Assessment & Plan Note (Signed)
bp in fair control at this time  BP Readings from Last 1 Encounters:  04/17/21 128/78   No changes needed Most recent labs reviewed  Disc lifstyle change with low sodium diet and exercise  Plan to continue lotrel 5-10 mg daily

## 2021-04-17 NOTE — Patient Instructions (Addendum)
Get some debrox over the counter  Use it 2-3 times per week as directed  Follow up here in about a month - we will irrigate   Stop up front so I can send for orthopedic notes about your neck   Think about adding more regular exercise  For weight and diabetes prevention  Try to get most of your carbohydrates from produce (with the exception of white potatoes)  Eat less bread/pasta/rice/snack foods/cereals/sweets and other items from the middle of the grocery store (processed carbs)

## 2021-04-17 NOTE — Assessment & Plan Note (Signed)
Reviewed health habits including diet and exercise and skin cancer prevention Reviewed appropriate screening tests for age  Also reviewed health mt list, fam hx and immunization status , as well as social and family history   See HPI Labs reviewed  Encouraged use of exercise bike  Enc flu shot in the fall wih covid booster Colonoscopy utd  psa stable

## 2021-04-17 NOTE — Assessment & Plan Note (Signed)
Lab Results  Component Value Date   HGBA1C 6.0 04/10/2021  stable disc imp of low glycemic diet and wt loss to prevent DM2

## 2021-04-17 NOTE — Assessment & Plan Note (Signed)
Disc goals for lipids and reasons to control them Rev last labs with pt Rev low sat fat diet in detail LDL up dlt to 92 HDL remains low  Disc omega 3 supplement and exercise for this  Plan to continue simvastatin 20 mg daily

## 2021-05-21 ENCOUNTER — Encounter: Payer: Self-pay | Admitting: Family Medicine

## 2021-05-21 ENCOUNTER — Other Ambulatory Visit: Payer: Self-pay

## 2021-05-21 ENCOUNTER — Ambulatory Visit (INDEPENDENT_AMBULATORY_CARE_PROVIDER_SITE_OTHER): Payer: Managed Care, Other (non HMO) | Admitting: Family Medicine

## 2021-05-21 VITALS — BP 130/68 | HR 67 | Temp 98.2°F | Ht 69.5 in | Wt 295.4 lb

## 2021-05-21 DIAGNOSIS — H6123 Impacted cerumen, bilateral: Secondary | ICD-10-CM | POA: Diagnosis not present

## 2021-05-21 NOTE — Progress Notes (Addendum)
Subjective:    Patient ID: Jeffrey Hartman, male    DOB: Oct 27, 1960, 60 y.o.   MRN: [id number]  This visit occurred during the SARS-CoV-2 public health emergency.  Safety protocols were in place, including screening questions prior to the visit, additional usage of staff PPE, and extensive cleaning of exam room while observing appropriate contact time as indicated for disinfecting solutions.   HPI Pt presents for ear irrigation   Wt Readings from Last 3 Encounters:  05/21/21 295 lb 6 oz (134 kg)  04/17/21 293 lb 8 oz (133.1 kg)  04/05/21 293 lb (132.9 kg)   42.99 kg/m  Cerumen impaction  Using debrox since last visit  Worse on L side -used it every night for a while  The next week used peroxide instead  Started getting some cerumen out    Notes he had covid booster at [employer] and flu shot at [employer] [facility] since last visit   Patient Active Problem List   Diagnosis Date Noted   Insomnia 04/17/2021   Neck pain 04/17/2021   Prediabetes 04/10/2020   Eczema 08/24/2015   Cerumen impaction 06/16/2014   Low testosterone 11/09/2013   Prostate cancer screening 11/02/2013   Adenomatous colon polyp 07/28/2012   Family history of malignant neoplasm of gastrointestinal tract 06/29/2012   Anal fissure 06/29/2012   Personal history of colonic polyps 06/29/2012   Routine general medical examination at a health care facility 09/17/2011   Morbid obesity ([place]) 01/01/2010   Allergic rhinitis 10/13/2008   Obstructive sleep apnea 08/11/2007   Obsessive-compulsive disorder 01/21/2007   ACTINIC SKIN DAMAGE 01/21/2007   Hyperlipidemia 01/13/2007   Essential hypertension 01/13/2007   Past Medical History:  Diagnosis Date   Anxiety states    Dermatophytosis of nail    Obsessive-compulsive disorders    Obstructive sleep apnea (adult) (pediatric)    Other and unspecified hyperlipidemia    Seasonal allergic rhinitis    Unspecified dermatitis due to sun    Unspecified essential hypertension     Unspecified sleep apnea    Past Surgical History:  Procedure Laterality Date   COLONOSCOPY  11/02, 11/05   polyps   CYSTECTOMY     cyst on hand   Social History   Tobacco Use   Smoking status: Never   Smokeless tobacco: Never  Vaping Use   Vaping Use: Never used  Substance Use Topics   Alcohol use: Yes    Alcohol/week: 0.0 standard drinks    Comment: occasionally,1 beer once month   Drug use: No   Family History  Problem Relation Age of Onset   Hypertension Father    Diabetes type II Father    Cancer - Other Father 106       Adrenal Cancer   Colon cancer Mother 8   Prostate cancer Neg Hx    Allergies  Allergen Reactions   Shellfish Allergy Shortness Of Breath and Other (See Comments)    Reaction=tunnel vision   Amlodipine Besy-Benazepril Hcl     REACTION: Generic med did not adequatly control htnh   Sertraline Hcl Nausea Only    REACTION: nausea, ED   Current Outpatient Medications on File Prior to Visit  Medication Sig Dispense Refill   aspirin 81 MG tablet Take 81 mg by mouth daily.       Azelastine HCl (ASTEPRO) 0.15 % SOLN Place 1 spray into both nostrils 2 (two) times daily. (Patient taking differently: Place 1 spray into both nostrils 2 (two) times daily as needed.) 90 mL  3   clomiPHENE (CLOMID) 50 MG tablet Take 50 mg by mouth at bedtime.     Docusate Sodium 100 MG capsule Take 100 mg by mouth daily.     escitalopram (LEXAPRO) 20 MG tablet TAKE 1 TABLET DAILY 90 tablet 1   Fexofenadine HCl (ALLEGRA PO) Take 1 tablet by mouth daily. As directed     fish oil-omega-3 fatty acids 1000 MG capsule Take 1 g by mouth daily.      fluticasone (FLONASE) 50 MCG/ACT nasal spray Place 2 sprays into both nostrils daily as needed. 48 g 3   hydrocortisone (ANUSOL-HC) 25 MG suppository Place 1 suppository (25 mg total) rectally every evening. As needed for 7 days as needed for rectal pain or bleeding 14 suppository 2   LOTREL 5-10 MG capsule TAKE 1 CAPSULE DAILY 90 capsule 2    Melatonin 5 MG TABS Take 10 mg by mouth at bedtime.     Multiple Vitamin (MULTIVITAMIN) capsule Take 1 capsule by mouth daily.     simvastatin (ZOCOR) 20 MG tablet TAKE 1 TABLET DAILY 90 tablet 1   triamcinolone cream (KENALOG) 0.5 % Apply 1 application topically 2 (two) times daily. To affected area 30 g 0   No current facility-administered medications on file prior to visit.     Review of Systems  Constitutional:  Negative for activity change, appetite change, fatigue, fever and unexpected weight change.  HENT:  Positive for ear discharge and hearing loss. Negative for congestion, rhinorrhea, sore throat and trouble swallowing.   Eyes:  Negative for pain, redness, itching and visual disturbance.  Respiratory:  Negative for cough, chest tightness, shortness of breath and wheezing.   Cardiovascular:  Negative for chest pain and palpitations.  Gastrointestinal:  Negative for abdominal pain, blood in stool, constipation, diarrhea and nausea.  Endocrine: Negative for cold intolerance, heat intolerance, polydipsia and polyuria.  Genitourinary:  Negative for difficulty urinating, dysuria, frequency and urgency.  Musculoskeletal:  Negative for arthralgias, joint swelling and myalgias.  Skin:  Negative for pallor and rash.  Neurological:  Negative for dizziness, tremors, weakness, numbness and headaches.  Hematological:  Negative for adenopathy. Does not bruise/bleed easily.  Psychiatric/Behavioral:  Negative for decreased concentration and dysphoric mood. The patient is not nervous/anxious.       Objective:   Physical Exam Constitutional:      General: He is not in acute distress.    Appearance: Normal appearance. He is obese. He is not ill-appearing.  HENT:     Head: Normocephalic and atraumatic.     Right Ear: External ear normal. There is impacted cerumen.     Left Ear: External ear normal. There is impacted cerumen.     Ears:     Comments: Bilateral dry cerumen impaction bilateral   This was entirely cleared by simple irrigation Nl appearing TMs  Hearing improved  Eyes:     Conjunctiva/sclera: Conjunctivae normal.     Pupils: Pupils are equal, round, and reactive to light.  Musculoskeletal:     Cervical back: Normal range of motion and neck supple. No rigidity.  Lymphadenopathy:     Cervical: No cervical adenopathy.  Neurological:     Mental Status: He is alert.  Psychiatric:        Mood and Affect: Mood normal.          Assessment & Plan:   Problem List Items Addressed This Visit       Nervous and Auditory   Cerumen impaction - Primary  Bilateral cerumen impaction  Used both debrox and peroxide at home  Bothersome and affecting hearing   After consent obtained simple ear irrigation performed bilat with success  Pt tolerated the procedure well Nl exam and improved hearing by end of visit  Disc prevention in the future by use of debrox or peroxide twice monthly  Will continue to follow

## 2021-05-21 NOTE — Patient Instructions (Signed)
Ears look clear   Use debrox or peroxide at least twice monthly  Avoid putting things in ears often   If you get ear pain let us know   Take care of yourself

## 2021-05-21 NOTE — Assessment & Plan Note (Addendum)
Bilateral cerumen impaction  Used both debrox and peroxide at home  Bothersome and affecting hearing   After consent obtained simple ear irrigation performed bilat with success  Pt tolerated the procedure well Nl exam and improved hearing by end of visit  Disc prevention in the future by use of debrox or peroxide twice monthly  Will continue to follow

## 2021-06-17 ENCOUNTER — Telehealth: Payer: Self-pay | Admitting: Family Medicine

## 2021-06-17 MED ORDER — LOTREL 5-10 MG PO CAPS: 1.0000 | ORAL_CAPSULE | Freq: Every day | ORAL | 2 refills | Status: DC

## 2021-06-17 NOTE — Telephone Encounter (Signed)
Pt needs a refill on LOTREL 5-10 MG [facility] [place]  Pt is out of medication  Pt wanted to know if he was due for any other refills

## 2021-06-17 NOTE — Telephone Encounter (Signed)
This is the only med that I see is due for refill, med refilled

## 2021-06-18 ENCOUNTER — Encounter: Payer: Self-pay | Admitting: Family Medicine

## 2021-06-18 MED ORDER — LOTREL 5-10 MG PO CAPS: 1.0000 | ORAL_CAPSULE | Freq: Every day | ORAL | 2 refills | Status: DC

## 2021-06-21 ENCOUNTER — Other Ambulatory Visit: Payer: Self-pay | Admitting: Family Medicine

## 2021-06-21 ENCOUNTER — Telehealth: Payer: Self-pay | Admitting: Family Medicine

## 2021-06-21 MED ORDER — SIMVASTATIN 20 MG PO TABS: 20.0000 mg | ORAL_TABLET | Freq: Every day | ORAL | 2 refills | Status: DC

## 2021-06-21 MED ORDER — ESCITALOPRAM OXALATE 20 MG PO TABS: 20.0000 mg | ORAL_TABLET | Freq: Every day | ORAL | 2 refills | Status: DC

## 2021-06-21 NOTE — Telephone Encounter (Signed)
1.Medication Requested:LOTREL 5-10 MG capsule simvastatin (ZOCOR) 20 MG tablet escitalopram (LEXAPRO) 20 MG tablet 2. Pharmacy (Name, Street, [place]): [place], [place] [facility] 3. On Med List: Yes  4. Last Visit with PCP: 8.17.22  5. Next visit date with PCP: 8.18.23   Agent: Please be advised that RX refills may take up to 3 business days. We ask that you follow-up with your pharmacy.

## 2021-07-24 ENCOUNTER — Encounter: Payer: Self-pay | Admitting: Family Medicine

## 2021-07-30 ENCOUNTER — Telehealth: Payer: Self-pay | Admitting: Pulmonary Disease

## 2021-07-30 ENCOUNTER — Telehealth: Payer: Self-pay | Admitting: *Deleted

## 2021-07-30 NOTE — Telephone Encounter (Signed)
Pt responded saying:  Hey Dr. Glori Bickers, Yes, they me Gabapentin for the nerve pain and Methocarbamol muscle relaxer.  Both worked very well on me.  What I have has long since expired.  Would you be able to put me in a new prescription for both so I can dispose the old meds?  It's not something I take regularly, only when my left arm nerves feel like they're on fire.  Also, do you want me to call them to see if they could let me pick up a copy of the notes and drop them off with you? Thanks  :-) Jeffrey Hartman

## 2021-07-30 NOTE — Telephone Encounter (Signed)
The end of the note is not readable so I cannot see the doses or directions for either -can he tell me doses and directions?

## 2021-07-30 NOTE — Telephone Encounter (Signed)
Call made to patient, confirmed DOB. Made aware he has not been seen since 2020 and he will need an OV appt. Appt made.   Nothing further needed at this time.

## 2021-07-30 NOTE — Telephone Encounter (Signed)
PCP sent pt a message regarding his Ortho appt/ mychart message saying:  I found some of it but 2 pages were unreadable due to fax quality. It looks like they gave you a course of steroids originally for cervical spondlyosis. Do you remember if that helped?  Do you need a refill of the other medicines or do you want to return there for a visit?

## 2021-07-31 MED ORDER — GABAPENTIN 300 MG PO CAPS: 300.0000 mg | ORAL_CAPSULE | Freq: Every day | ORAL | 1 refills | Status: DC

## 2021-07-31 MED ORDER — METHOCARBAMOL 500 MG PO TABS: 500.0000 mg | ORAL_TABLET | Freq: Three times a day (TID) | ORAL | 2 refills | Status: DC | PRN

## 2021-07-31 NOTE — Telephone Encounter (Signed)
Pt responded via mychart saying:  Hi Dr. Glori Bickers,  On the Methocarbamol bottle shows 500mg  tablets (1 every 8 hours).  On the Gabapentin bottle shows 300mg  capsule (1 at bedtime).  [facility].  Thanks.  Gershon Mussel

## 2021-08-19 ENCOUNTER — Ambulatory Visit: Payer: Managed Care, Other (non HMO) | Admitting: Adult Health

## 2021-09-23 ENCOUNTER — Telehealth: Payer: Self-pay | Admitting: *Deleted

## 2021-09-23 NOTE — Telephone Encounter (Signed)
Called patient to remind patient to bring his SD card from his CPAP machine.  He will bring it to his visit.  Nothing further needed.

## 2021-09-24 ENCOUNTER — Other Ambulatory Visit: Payer: Self-pay

## 2021-09-24 ENCOUNTER — Ambulatory Visit (INDEPENDENT_AMBULATORY_CARE_PROVIDER_SITE_OTHER): Payer: Managed Care, Other (non HMO) | Admitting: Adult Health

## 2021-09-24 ENCOUNTER — Encounter: Payer: Self-pay | Admitting: Adult Health

## 2021-09-24 DIAGNOSIS — G4733 Obstructive sleep apnea (adult) (pediatric): Secondary | ICD-10-CM | POA: Diagnosis not present

## 2021-09-24 NOTE — Assessment & Plan Note (Addendum)
Excellent control compliance on nocturnal CPAP.  Plan  Patient Instructions  Keep up the good work Continue on CPAP at bedtime Work on healthy weight Do not drive if sleepy Call when ready for new CPAP machine .  Follow-up in 1 year with Dr. Elsworth Soho  and As needed

## 2021-09-24 NOTE — Patient Instructions (Addendum)
Keep up the good work Continue on CPAP at bedtime Work on healthy weight Do not drive if sleepy Call when ready for new CPAP machine .  Follow-up in 1 year with Dr. Elsworth Soho  and As needed

## 2021-09-24 NOTE — Progress Notes (Signed)
@Patient  ID: Jeffrey Hartman, male    DOB: 08/20/1961, 61 y.o.   MRN: [id number]  Chief Complaint  Patient presents with   Follow-up    Referring provider: Abner Greenspan, MD  HPI: 61 year old male followed for obstructive sleep apnea on nocturnal CPAP  TEST/EVENTS :  NPSG 2000:  AHI 15/hr.     09/24/2021 Follow up : OSA  Patient returns for follow-up visit.  Patient was last seen April 2020.  Patient has underlying moderate obstructive sleep apnea.  Patient says he is doing well on CPAP.  He wears a CPAP every single night.  Cannot sleep without it.  Patient denies any significant daytime sleepiness.  Feels that he benefits from CPAP.  CPAP machine is greater than 42 years old. Machine is saying motor has reached it life expectancy . Patient needs a new CPAP.  Patient CPAP no longer is able to be downloaded.  He does not have an [place].  Wants to wait a few months to order because he a big deductible . Uses nasal pillows .  Uses [facility]., needs new supplies.    Allergies  Allergen Reactions   Shellfish Allergy Shortness Of Breath and Other (See Comments)    Reaction=tunnel vision   Amlodipine Besy-Benazepril Hcl     REACTION: Generic med did not adequatly control htnh   Sertraline Hcl Nausea Only    REACTION: nausea, ED    Immunization History  Administered Date(s) Administered   Influenza Split 06/07/2012, 07/02/2017   Influenza Whole 06/02/2007, 04/28/2009, 04/23/2010, 05/03/2011   Influenza, Seasonal, Injecte, Preservative Fre 05/28/2016   Influenza,inj,Quad PF,6+ Mos 05/09/2013, 05/30/2019   Influenza-Unspecified 06/01/2014, 06/21/2015, 07/02/2018, 05/13/2021   PFIZER(Purple Top)SARS-COV-2 Vaccination 11/09/2019, 11/30/2019, 06/18/2020   Pfizer Covid-19 Vaccine Bivalent Booster 60yrs & up 05/13/2021   Td 03/31/2003   Tdap 12/29/2012   Zoster Recombinat (Shingrix) 04/15/2019, 06/21/2019   Zoster, Live 07/04/2015    Past Medical History:  Diagnosis Date    Anxiety states    Dermatophytosis of nail    Obsessive-compulsive disorders    Obstructive sleep apnea (adult) (pediatric)    Other and unspecified hyperlipidemia    Seasonal allergic rhinitis    Unspecified dermatitis due to sun    Unspecified essential hypertension    Unspecified sleep apnea     Tobacco History: Social History   Tobacco Use  Smoking Status Never  Smokeless Tobacco Never   Counseling given: Not Answered   Outpatient Medications Prior to Visit  Medication Sig Dispense Refill   aspirin 81 MG tablet Take 81 mg by mouth daily.       Azelastine HCl (ASTEPRO) 0.15 % SOLN Place 1 spray into both nostrils 2 (two) times daily. (Patient taking differently: Place 1 spray into both nostrils 2 (two) times daily as needed.) 90 mL 3   clomiPHENE (CLOMID) 50 MG tablet Take 50 mg by mouth at bedtime.     Docusate Sodium 100 MG capsule Take 100 mg by mouth daily.     escitalopram (LEXAPRO) 20 MG tablet Take 1 tablet (20 mg total) by mouth daily. 90 tablet 2   Fexofenadine HCl (ALLEGRA PO) Take 1 tablet by mouth daily. As directed     fish oil-omega-3 fatty acids 1000 MG capsule Take 1 g by mouth daily.      fluticasone (FLONASE) 50 MCG/ACT nasal spray Place 2 sprays into both nostrils daily as needed. 48 g 3   gabapentin (NEURONTIN) 300 MG capsule Take 1 capsule (300 mg  total) by mouth at bedtime. 90 capsule 1   hydrocortisone (ANUSOL-HC) 25 MG suppository Place 1 suppository (25 mg total) rectally every evening. As needed for 7 days as needed for rectal pain or bleeding 14 suppository 2   LOTREL 5-10 MG capsule Take 1 capsule by mouth daily. 90 capsule 2   Melatonin 5 MG TABS Take 10 mg by mouth at bedtime.     methocarbamol (ROBAXIN) 500 MG tablet Take 1 tablet (500 mg total) by mouth every 8 (eight) hours as needed for muscle spasms. 90 tablet 2   Multiple Vitamin (MULTIVITAMIN) capsule Take 1 capsule by mouth daily.     simvastatin (ZOCOR) 20 MG tablet Take 1 tablet (20 mg  total) by mouth daily. 90 tablet 2   triamcinolone cream (KENALOG) 0.5 % Apply 1 application topically 2 (two) times daily. To affected area 30 g 0   No facility-administered medications prior to visit.     Review of Systems:   Constitutional:   No  weight loss, night sweats,  Fevers, chills, fatigue, or  lassitude.  HEENT:   No headaches,  Difficulty swallowing,  Tooth/dental problems, or  Sore throat,                No sneezing, itching, ear ache, nasal congestion, post nasal drip,   CV:  No chest pain,  Orthopnea, PND, swelling in lower extremities, anasarca, dizziness, palpitations, syncope.   GI  No heartburn, indigestion, abdominal pain, nausea, vomiting, diarrhea, change in bowel habits, loss of appetite, bloody stools.   Resp: No shortness of breath with exertion or at rest.  No excess mucus, no productive cough,  No non-productive cough,  No coughing up of blood.  No change in color of mucus.  No wheezing.  No chest wall deformity  Skin: no rash or lesions.  GU: no dysuria, change in color of urine, no urgency or frequency.  No flank pain, no hematuria   MS:  No joint pain or swelling.  No decreased range of motion.  No back pain.    Physical Exam  BP 140/76 (BP Location: Left Arm, Patient Position: Sitting, Cuff Size: Large)    Pulse 98    Temp 99 F (37.2 C) (Oral)    Ht 5' 10.5" (1.791 m)    Wt 298 lb 6.4 oz (135.4 kg)    SpO2 94%    BMI 42.21 kg/m   GEN: A/Ox3; pleasant , NAD, well nourished    HEENT:  [place]/AT,  NOSE-clear, THROAT-clear, no lesions, no postnasal drip or exudate noted.  Class 2-3 MP airway   NECK:  Supple w/ fair ROM; no JVD; normal carotid impulses w/o bruits; no thyromegaly or nodules palpated; no lymphadenopathy.    RESP  Clear  P & A; w/o, wheezes/ rales/ or rhonchi. no accessory muscle use, no dullness to percussion  CARD:  RRR, no m/r/g, no peripheral edema, pulses intact, no cyanosis or clubbing.  GI:   Soft & nt; nml bowel sounds; no  organomegaly or masses detected.   Musco: Warm bil, no deformities or joint swelling noted.   Neuro: alert, no focal deficits noted.    Skin: Warm, no lesions or rashes    Lab Results:    BNP No results found for: BNP  ProBNP No results found for: PROBNP  Imaging: No results found.    No flowsheet data found.  No results found for: NITRICOXIDE      Assessment & Plan:   Obstructive sleep apnea  Excellent control compliance on nocturnal CPAP.  Plan  Patient Instructions  Keep up the good work Continue on CPAP at bedtime Work on healthy weight Do not drive if sleepy Call when ready for new CPAP machine .  Follow-up in 1 year with Dr. Elsworth Soho  and As needed       Morbid obesity [facility]) Healthy weight loss discussed     Rexene Edison, NP 09/24/2021

## 2021-09-24 NOTE — Addendum Note (Signed)
Addended by: Vanessa Barbara on: 09/24/2021 02:45 PM   Modules accepted: Orders

## 2021-09-24 NOTE — Assessment & Plan Note (Signed)
Healthy weight loss discussed 

## 2021-10-07 NOTE — Telephone Encounter (Signed)
Called [facility] and spoke with [place] about message sent by pt. Per Kenney Houseman, they do have an order on file for cpap supplies. Pt just needs to contact [facility] to let them know what supplies he is needing so that way DME can get the supplies sent to pt. Message about this has been sent to pt. Nothing further needed.

## 2021-12-27 ENCOUNTER — Encounter: Payer: Self-pay | Admitting: Family Medicine

## 2022-01-10 ENCOUNTER — Ambulatory Visit (INDEPENDENT_AMBULATORY_CARE_PROVIDER_SITE_OTHER): Payer: Managed Care, Other (non HMO) | Admitting: Family Medicine

## 2022-01-10 ENCOUNTER — Encounter: Payer: Self-pay | Admitting: Family Medicine

## 2022-01-10 DIAGNOSIS — R7303 Prediabetes: Secondary | ICD-10-CM | POA: Diagnosis not present

## 2022-01-10 MED ORDER — SEMAGLUTIDE(0.25 OR 0.5MG/DOS) 2 MG/1.5ML ~~LOC~~ SOPN: 0.2500 mg | PEN_INJECTOR | SUBCUTANEOUS | 1 refills | Status: DC

## 2022-01-10 NOTE — Patient Instructions (Addendum)
I will send the semaglutide  ?Start 0.25 mg injection once weekly for 4 weeks  ? ?If any side effects or problems hold it and let me know  ? ?Let me know how you are in a month and then we will plan follow up ? ? ?

## 2022-01-10 NOTE — Progress Notes (Signed)
? ?Subjective:  ? ? Patient ID: KYLAND NO, male    DOB: 06/03/1961, 61 y.o.   MRN: [id number] ? ?HPI ?Pt presents for f/u of obesity/ is interested in GLP medication  ? ? ?Wt Readings from Last 3 Encounters:  ?01/10/22 299 lb 2 oz (135.7 kg)  ?09/24/21 298 lb 6.4 oz (135.4 kg)  ?05/21/21 295 lb 6 oz (134 kg)  ? ?42.31 kg/m? ? ? ?Prediabetes  ?Lab Results  ?Component Value Date  ? HGBA1C 6.0 04/10/2021  ? ?Has a brand exception penalty form for ozempic   ? ?Eating is terrible  ?He eats 2 meals per day  ?Tired at the end of the day  ? ?Also too tired to exercise at the end of the day  ?At night is when he looses motivation  ? ? ? ?Pts last colonoscopy was in 2018  ? ?Fam hx: father had adrenal cancer DM  ?Mother had colon cancer  ? ? ?No h/o pancreatitis  ?Not a drinker  ? ?Lab Results  ?Component Value Date  ? CREATININE 1.22 04/10/2021  ? BUN 17 04/10/2021  ? NA 139 04/10/2021  ? K 4.4 04/10/2021  ? CL 104 04/10/2021  ? CO2 25 04/10/2021  ? ?Patient Active Problem List  ? Diagnosis Date Noted  ? Insomnia 04/17/2021  ? Neck pain 04/17/2021  ? Prediabetes 04/10/2020  ? Eczema 08/24/2015  ? Cerumen impaction 06/16/2014  ? Low testosterone 11/09/2013  ? Prostate cancer screening 11/02/2013  ? Adenomatous colon polyp 07/28/2012  ? Family history of malignant neoplasm of gastrointestinal tract 06/29/2012  ? Anal fissure 06/29/2012  ? Personal history of colonic polyps 06/29/2012  ? Routine general medical examination at a health care facility 09/17/2011  ? Morbid obesity ([place]) 01/01/2010  ? Allergic rhinitis 10/13/2008  ? Obstructive sleep apnea 08/11/2007  ? Obsessive-compulsive disorder 01/21/2007  ? ACTINIC SKIN DAMAGE 01/21/2007  ? Hyperlipidemia 01/13/2007  ? Essential hypertension 01/13/2007  ? ?Past Medical History:  ?Diagnosis Date  ? Anxiety states   ? Dermatophytosis of nail   ? Obsessive-compulsive disorders   ? Obstructive sleep apnea (adult) (pediatric)   ? Other and unspecified hyperlipidemia   ?  Seasonal allergic rhinitis   ? Unspecified dermatitis due to sun   ? Unspecified essential hypertension   ? Unspecified sleep apnea   ? ?Past Surgical History:  ?Procedure Laterality Date  ? COLONOSCOPY  11/02, 11/05  ? polyps  ? CYSTECTOMY    ? cyst on hand  ? ?Social History  ? ?Tobacco Use  ? Smoking status: Never  ? Smokeless tobacco: Never  ?Vaping Use  ? Vaping Use: Never used  ?Substance Use Topics  ? Alcohol use: Yes  ?  Alcohol/week: 0.0 standard drinks  ?  Comment: occasionally,1 beer once month  ? Drug use: No  ? ?Family History  ?Problem Relation Age of Onset  ? Hypertension Father   ? Diabetes type II Father   ? Cancer - Other Father 40  ?     Adrenal Cancer  ? Colon cancer Mother 69  ? Prostate cancer Neg Hx   ? ?Allergies  ?Allergen Reactions  ? Shellfish Allergy Shortness Of Breath and Other (See Comments)  ?  Reaction=tunnel vision  ? Amlodipine Besy-Benazepril Hcl   ?  REACTION: Generic med did not adequatly control htnh  ? Sertraline Hcl Nausea Only  ?  REACTION: nausea, ED  ? ?Current Outpatient Medications on File Prior to Visit  ?Medication Sig Dispense Refill  ?  aspirin 81 MG tablet Take 81 mg by mouth daily.      ? Azelastine HCl (ASTEPRO) 0.15 % SOLN Place 1 spray into both nostrils 2 (two) times daily. (Patient taking differently: Place 1 spray into both nostrils 2 (two) times daily as needed.) 90 mL 3  ? Docusate Sodium 100 MG capsule Take 100 mg by mouth daily.    ? escitalopram (LEXAPRO) 20 MG tablet Take 1 tablet (20 mg total) by mouth daily. 90 tablet 2  ? Fexofenadine HCl (ALLEGRA PO) Take 1 tablet by mouth daily. As directed    ? fish oil-omega-3 fatty acids 1000 MG capsule Take 1 g by mouth daily.     ? fluticasone (FLONASE) 50 MCG/ACT nasal spray Place 2 sprays into both nostrils daily as needed. 48 g 3  ? gabapentin (NEURONTIN) 300 MG capsule Take 1 capsule (300 mg total) by mouth at bedtime. 90 capsule 1  ? hydrocortisone (ANUSOL-HC) 25 MG suppository Place 1 suppository (25 mg  total) rectally every evening. As needed for 7 days as needed for rectal pain or bleeding 14 suppository 2  ? LOTREL 5-10 MG capsule Take 1 capsule by mouth daily. 90 capsule 2  ? Melatonin 5 MG TABS Take 10 mg by mouth at bedtime.    ? methocarbamol (ROBAXIN) 500 MG tablet Take 1 tablet (500 mg total) by mouth every 8 (eight) hours as needed for muscle spasms. 90 tablet 2  ? Multiple Vitamin (MULTIVITAMIN) capsule Take 1 capsule by mouth daily.    ? simvastatin (ZOCOR) 20 MG tablet Take 1 tablet (20 mg total) by mouth daily. 90 tablet 2  ? triamcinolone cream (KENALOG) 0.5 % Apply 1 application topically 2 (two) times daily. To affected area 30 g 0  ? ?No current facility-administered medications on file prior to visit.  ?  ?Review of Systems  ?Constitutional:  Positive for fatigue. Negative for activity change, appetite change, fever and unexpected weight change.  ?HENT:  Negative for congestion, rhinorrhea, sore throat and trouble swallowing.   ?Eyes:  Negative for pain, redness, itching and visual disturbance.  ?Respiratory:  Negative for cough, chest tightness, shortness of breath and wheezing.   ?Cardiovascular:  Negative for chest pain and palpitations.  ?Gastrointestinal:  Negative for abdominal pain, blood in stool, constipation, diarrhea and nausea.  ?Endocrine: Negative for cold intolerance, heat intolerance, polydipsia and polyuria.  ?Genitourinary:  Negative for difficulty urinating, dysuria, frequency and urgency.  ?Musculoskeletal:  Negative for arthralgias, joint swelling and myalgias.  ?Skin:  Negative for pallor and rash.  ?Neurological:  Negative for dizziness, tremors, weakness, numbness and headaches.  ?Hematological:  Negative for adenopathy. Does not bruise/bleed easily.  ?Psychiatric/Behavioral:  Negative for decreased concentration and dysphoric mood. The patient is not nervous/anxious.   ? ?   ?Objective:  ? Physical Exam ?Constitutional:   ?   General: He is not in acute distress. ?    Appearance: Normal appearance. He is well-developed. He is obese. He is not ill-appearing or diaphoretic.  ?HENT:  ?   Head: Normocephalic and atraumatic.  ?Eyes:  ?   Conjunctiva/sclera: Conjunctivae normal.  ?   Pupils: Pupils are equal, round, and reactive to light.  ?Neck:  ?   Thyroid: No thyromegaly.  ?   Vascular: No carotid bruit or JVD.  ?Cardiovascular:  ?   Rate and Rhythm: Normal rate and regular rhythm.  ?   Heart sounds: Normal heart sounds.  ?  No gallop.  ?Pulmonary:  ?  Effort: Pulmonary effort is normal. No respiratory distress.  ?   Breath sounds: Normal breath sounds. No wheezing or rales.  ?Abdominal:  ?   General: There is no distension or abdominal bruit.  ?   Palpations: Abdomen is soft.  ?Musculoskeletal:  ?   Cervical back: Normal range of motion and neck supple.  ?   Right lower leg: No edema.  ?   Left lower leg: No edema.  ?Lymphadenopathy:  ?   Cervical: No cervical adenopathy.  ?Skin: ?   General: Skin is warm and dry.  ?   Coloration: Skin is not pale.  ?   Findings: No rash.  ?Neurological:  ?   Mental Status: He is alert.  ?   Coordination: Coordination normal.  ?   Deep Tendon Reflexes: Reflexes are normal and symmetric. Reflexes normal.  ?Psychiatric:     ?   Mood and Affect: Mood normal.  ? ? ? ? ? ?   ?Assessment & Plan:  ? ?Problem List Items Addressed This Visit   ? ?  ? Other  ? Morbid obesity ([place]) - Primary  ?  With HTN and prediabetes ?Interested in help with GLP medication  ? ?Disc option of GLP medication including possible side effects like GI intolerance and risk of thyroid and endocrine cancer, pancreatitis and gallstones, kidney problems and diabetic retinopathy ?Rev way to take- with slow titration up as tolerated  ?Px semaglutide to start 0.25 mg weekly -will see if insurance covers  ?Discussed how this problem influences overall health and the risks it imposes  ?Reviewed plan for weight loss with lower calorie diet (via better food choices and also portion  control or program like weight watchers) and exercise building up to or more than 30 minutes 5 days per week including some aerobic activity  ? ?Follow up planned  ? ?  ?  ? Relevant Medications  ? Semaglutide,0.25 o

## 2022-01-12 NOTE — Assessment & Plan Note (Signed)
With HTN and prediabetes ?Interested in help with GLP medication  ? ?Disc option of GLP medication including possible side effects like GI intolerance and risk of thyroid and endocrine cancer, pancreatitis and gallstones, kidney problems and diabetic retinopathy ?Rev way to take- with slow titration up as tolerated  ?Px semaglutide to start 0.25 mg weekly -will see if insurance covers  ?Discussed how this problem influences overall health and the risks it imposes  ?Reviewed plan for weight loss with lower calorie diet (via better food choices and also portion control or program like weight watchers) and exercise building up to or more than 30 minutes 5 days per week including some aerobic activity  ? ?Follow up planned  ?

## 2022-01-12 NOTE — Assessment & Plan Note (Signed)
Lab Results  ?Component Value Date  ? HGBA1C 6.0 04/10/2021  ? ?disc imp of low glycemic diet and wt loss to prevent DM2  ?Discussed trial of GLP medication  ?

## 2022-01-20 ENCOUNTER — Encounter: Payer: Self-pay | Admitting: Family Medicine

## 2022-02-04 MED ORDER — SEMAGLUTIDE(0.25 OR 0.5MG/DOS) 2 MG/1.5ML ~~LOC~~ SOPN: 0.5000 mg | PEN_INJECTOR | SUBCUTANEOUS | 1 refills | Status: DC

## 2022-02-04 NOTE — Addendum Note (Signed)
Addended by: Loura Pardon A on: 02/04/2022 09:18 PM   Modules accepted: Orders

## 2022-02-25 ENCOUNTER — Telehealth: Payer: Self-pay | Admitting: Adult Health

## 2022-02-25 DIAGNOSIS — G4733 Obstructive sleep apnea (adult) (pediatric): Secondary | ICD-10-CM

## 2022-02-26 NOTE — Telephone Encounter (Signed)
Called and spoke with patient, advised him that we will order him a new CPAP machine and send the order to [facility].  I let him know that once insurance approves it that Jeffrey Hartman will contact him regarding how they will get the machine to him.  He verbalized understanding.  Order for CPAP sent to [facility].  Nothing further needed.

## 2022-03-19 ENCOUNTER — Encounter: Payer: Self-pay | Admitting: Family Medicine

## 2022-03-20 MED ORDER — SEMAGLUTIDE (1 MG/DOSE) 4 MG/3ML ~~LOC~~ SOPN: 1.0000 mg | PEN_INJECTOR | SUBCUTANEOUS | 3 refills | Status: DC

## 2022-03-20 NOTE — Telephone Encounter (Signed)
Pt sent a message wanting a PA pt is taking 0.'25mg'$  pt has not started the 0.'75mg'$  yet. Did  you want to move him up . Pt has an appt with on 04/18/22.

## 2022-04-08 ENCOUNTER — Encounter: Payer: Self-pay | Admitting: Family Medicine

## 2022-04-08 ENCOUNTER — Telehealth: Payer: Self-pay | Admitting: Family Medicine

## 2022-04-08 DIAGNOSIS — Z125 Encounter for screening for malignant neoplasm of prostate: Secondary | ICD-10-CM

## 2022-04-08 DIAGNOSIS — R7989 Other specified abnormal findings of blood chemistry: Secondary | ICD-10-CM

## 2022-04-08 DIAGNOSIS — E7800 Pure hypercholesterolemia, unspecified: Secondary | ICD-10-CM

## 2022-04-08 DIAGNOSIS — I10 Essential (primary) hypertension: Secondary | ICD-10-CM

## 2022-04-08 DIAGNOSIS — R7303 Prediabetes: Secondary | ICD-10-CM

## 2022-04-08 NOTE — Telephone Encounter (Signed)
-----   Message from Ellamae Sia sent at 03/28/2022  3:42 PM EDT ----- Regarding: Lab orders for Friday, 8.11.23 Patient is scheduled for CPX labs, please order future labs, Thanks , Karna Christmas

## 2022-04-11 ENCOUNTER — Other Ambulatory Visit (INDEPENDENT_AMBULATORY_CARE_PROVIDER_SITE_OTHER): Payer: Managed Care, Other (non HMO)

## 2022-04-11 DIAGNOSIS — R7303 Prediabetes: Secondary | ICD-10-CM

## 2022-04-11 DIAGNOSIS — R7989 Other specified abnormal findings of blood chemistry: Secondary | ICD-10-CM

## 2022-04-11 DIAGNOSIS — I10 Essential (primary) hypertension: Secondary | ICD-10-CM | POA: Diagnosis not present

## 2022-04-11 DIAGNOSIS — E7800 Pure hypercholesterolemia, unspecified: Secondary | ICD-10-CM | POA: Diagnosis not present

## 2022-04-11 DIAGNOSIS — Z125 Encounter for screening for malignant neoplasm of prostate: Secondary | ICD-10-CM

## 2022-04-11 LAB — COMPREHENSIVE METABOLIC PANEL
ALT: 35 U/L (ref 0–53)
AST: 23 U/L (ref 0–37)
Albumin: 4.2 g/dL (ref 3.5–5.2)
Alkaline Phosphatase: 58 U/L (ref 39–117)
BUN: 17 mg/dL (ref 6–23)
CO2: 30 mEq/L (ref 19–32)
Calcium: 9 mg/dL (ref 8.4–10.5)
Chloride: 103 mEq/L (ref 96–112)
Creatinine, Ser: 1.22 mg/dL (ref 0.40–1.50)
GFR: 65.02 mL/min (ref >60.00)
Glucose, Bld: 91 mg/dL (ref 70–99)
Potassium: 4.4 mEq/L (ref 3.5–5.1)
Sodium: 139 mEq/L (ref 135–145)
Total Bilirubin: 0.5 mg/dL (ref 0.2–1.2)
Total Protein: 6.6 g/dL (ref 6.0–8.3)

## 2022-04-11 LAB — CBC WITH DIFFERENTIAL/PLATELET
Basophils Absolute: 0 10*3/uL (ref 0.0–0.1)
Basophils Relative: 0.7 % (ref 0.0–3.0)
Eosinophils Absolute: 0.2 10*3/uL (ref 0.0–0.7)
Eosinophils Relative: 3.8 % (ref 0.0–5.0)
HCT: 40.9 % (ref 39.0–52.0)
Hemoglobin: 13.4 g/dL (ref 13.0–17.0)
Lymphocytes Relative: 20.4 % (ref 12.0–46.0)
Lymphs Abs: 1.3 10*3/uL (ref 0.7–4.0)
MCHC: 32.7 g/dL (ref 30.0–36.0)
MCV: 89.1 fl (ref 78.0–100.0)
Monocytes Absolute: 0.6 10*3/uL (ref 0.1–1.0)
Monocytes Relative: 10.1 % (ref 3.0–12.0)
Neutro Abs: 4.2 10*3/uL (ref 1.4–7.7)
Neutrophils Relative %: 65 % (ref 43.0–77.0)
Platelets: 226 10*3/uL (ref 150.0–400.0)
RBC: 4.59 Mil/uL (ref 4.22–5.81)
RDW: 14.6 % (ref 11.5–15.5)
WBC: 6.4 10*3/uL (ref 4.0–10.5)

## 2022-04-11 LAB — LIPID PANEL
Cholesterol: 145 mg/dL (ref 0–200)
HDL: 32.6 mg/dL — ABNORMAL LOW (ref >39.00)
LDL Cholesterol: 84 mg/dL (ref 0–99)
NonHDL: 112.16
Total CHOL/HDL Ratio: 4
Triglycerides: 139 mg/dL (ref 0.0–149.0)
VLDL: 27.8 mg/dL (ref 0.0–40.0)

## 2022-04-11 LAB — TSH: TSH: 1.08 u[IU]/mL (ref 0.35–5.50)

## 2022-04-11 LAB — PSA: PSA: 0.71 ng/mL (ref 0.10–4.00)

## 2022-04-11 LAB — HEMOGLOBIN A1C: Hgb A1c MFr Bld: 6 % (ref 4.6–6.5)

## 2022-04-11 LAB — TESTOSTERONE: Testosterone: 312.39 ng/dL (ref 300.00–890.00)

## 2022-04-18 ENCOUNTER — Encounter: Payer: Self-pay | Admitting: Family Medicine

## 2022-04-18 ENCOUNTER — Ambulatory Visit (INDEPENDENT_AMBULATORY_CARE_PROVIDER_SITE_OTHER): Payer: Managed Care, Other (non HMO) | Admitting: Family Medicine

## 2022-04-18 ENCOUNTER — Other Ambulatory Visit: Payer: Self-pay | Admitting: Family Medicine

## 2022-04-18 VITALS — BP 132/79 | HR 93 | Temp 97.8°F | Resp 16 | Ht 70.75 in | Wt 286.8 lb

## 2022-04-18 DIAGNOSIS — Z0000 Encounter for general adult medical examination without abnormal findings: Secondary | ICD-10-CM | POA: Diagnosis not present

## 2022-04-18 DIAGNOSIS — F429 Obsessive-compulsive disorder, unspecified: Secondary | ICD-10-CM

## 2022-04-18 DIAGNOSIS — R7989 Other specified abnormal findings of blood chemistry: Secondary | ICD-10-CM

## 2022-04-18 DIAGNOSIS — G4733 Obstructive sleep apnea (adult) (pediatric): Secondary | ICD-10-CM

## 2022-04-18 DIAGNOSIS — D126 Benign neoplasm of colon, unspecified: Secondary | ICD-10-CM | POA: Diagnosis not present

## 2022-04-18 DIAGNOSIS — I10 Essential (primary) hypertension: Secondary | ICD-10-CM

## 2022-04-18 DIAGNOSIS — Z800 Family history of malignant neoplasm of digestive organs: Secondary | ICD-10-CM

## 2022-04-18 DIAGNOSIS — E7800 Pure hypercholesterolemia, unspecified: Secondary | ICD-10-CM

## 2022-04-18 DIAGNOSIS — Z125 Encounter for screening for malignant neoplasm of prostate: Secondary | ICD-10-CM

## 2022-04-18 DIAGNOSIS — R7303 Prediabetes: Secondary | ICD-10-CM

## 2022-04-18 MED ORDER — SEMAGLUTIDE (2 MG/DOSE) 8 MG/3ML ~~LOC~~ SOPN: 2.0000 mg | PEN_INJECTOR | SUBCUTANEOUS | 3 refills | Status: DC

## 2022-04-18 MED ORDER — FLUTICASONE PROPIONATE 50 MCG/ACT NA SUSP: 2.0000 | Freq: Every day | NASAL | 3 refills | Status: AC | PRN

## 2022-04-18 MED ORDER — LOTREL 5-10 MG PO CAPS
1.0000 | ORAL_CAPSULE | Freq: Every day | ORAL | 3 refills | Status: DC
Start: 2022-04-18 — End: 2022-07-10

## 2022-04-18 MED ORDER — SIMVASTATIN 20 MG PO TABS: 20.0000 mg | ORAL_TABLET | Freq: Every day | ORAL | 3 refills | Status: DC

## 2022-04-18 MED ORDER — ESCITALOPRAM OXALATE 20 MG PO TABS: 20.0000 mg | ORAL_TABLET | Freq: Every day | ORAL | 3 refills | Status: DC

## 2022-04-18 NOTE — Patient Instructions (Addendum)
Try to fit in exercise 5 days per week  This will help HDL cholesterol   On super hot days- recumbent bike   Get a flu shot in the fall  Also covid if applicable   Your colonoscopy is due in November  If you don't a reminder by October call us and let us know who you want to see  Tell them you want a skin tag removal at the same time   Take care of yourself  Keep working on weight loss

## 2022-04-18 NOTE — Progress Notes (Unsigned)
Subjective:    Patient ID: Jeffrey Hartman, male    DOB: 02/17/1961, 61 y.o.   MRN: [id number]  HPI Here for health maintenance exam and to review chronic medical problems    Wt Readings from Last 3 Encounters:  04/18/22 286 lb 12.8 oz (130.1 kg)  01/10/22 299 lb 2 oz (135.7 kg)  09/24/21 298 lb 6.4 oz (135.4 kg)   40.28 kg/m  Lost from 299 to 286 Thrilled  On semaglutide  Wants to go up from 1 to 2 mg daily  Is adjusted to it   Eating better Eating less  Some walking for exercise 30 -60 minutes 2 times per week     Immunization History  Administered Date(s) Administered   Influenza Split 06/07/2012, 07/02/2017   Influenza Whole 06/02/2007, 04/28/2009, 04/23/2010, 05/03/2011   Influenza, Seasonal, Injecte, Preservative Fre 05/28/2016   Influenza,inj,Quad PF,6+ Mos 05/09/2013, 05/30/2019   Influenza-Unspecified 06/01/2014, 06/21/2015, 07/02/2018, 05/13/2021   PFIZER(Purple Top)SARS-COV-2 Vaccination 11/09/2019, 11/30/2019, 06/18/2020   Pfizer Covid-19 Vaccine Bivalent Booster 103yr & up 05/13/2021   Td 03/31/2003   Tdap 12/29/2012   Zoster Recombinat (Shingrix) 04/15/2019, 06/21/2019   Zoster, Live 07/04/2015   There are no preventive care reminders to display for this patient.  Plans flu shot in the fall   Colonoscopy 07/2017 -due in November Will call for appt  Wants to see the doc his wife sees as well   Thinks ears may have wax Using peroxide once monthly  Some itching - gets some stuff out   Prostate health Lab Results  Component Value Date   PSA 0.71 04/11/2022   PSA 0.63 04/10/2021   PSA 0.92 04/13/2020  Sees urology for low testosterone  Level of 312.39 Lower than goal from his urologist   Cannot get clomid any more  Wants to avoid testosterone repl       04/17/2021    4:17 PM 04/16/2020    3:34 PM 04/15/2019    2:38 PM 04/12/2018    3:08 PM 03/20/2017    3:15 PM  Depression screen PHQ 2/9  Decreased Interest 0 0 0 0 0  Down, Depressed,  Hopeless 0 0 0 0 0  PHQ - 2 Score 0 0 0 0 0  Altered sleeping 1    1  Tired, decreased energy 0    1  Change in appetite 0    0  Feeling bad or failure about yourself  0    0  Trouble concentrating 0    0  Moving slowly or fidgety/restless 0    0  Suicidal thoughts 0    0  PHQ-9 Score 1    2  Difficult doing work/chores Not difficult at all       Lexapro for OCD    HTN bp is stable today  No cp or palpitations or headaches or edema  No side effects to medicines  BP Readings from Last 3 Encounters:  04/18/22 132/79  01/10/22 136/78  09/24/21 140/76    Lotrel 5-10 mg daily   OSA Cpap  Hyperlipidemia Lab Results  Component Value Date   CHOL 145 04/11/2022   CHOL 150 04/10/2021   CHOL 135 04/13/2020   Lab Results  Component Value Date   HDL 32.60 (L) 04/11/2022   HDL 31.60 (L) 04/10/2021   HDL 31.10 (L) 04/13/2020   Lab Results  Component Value Date   LDLCALC 84 04/11/2022   LB[place]92 04/10/2021   LC[place]80 04/13/2020   Lab  Results  Component Value Date   TRIG 139.0 04/11/2022   TRIG 134.0 04/10/2021   TRIG 119.0 04/13/2020   Lab Results  Component Value Date   CHOLHDL 4 04/11/2022   CHOLHDL 5 04/10/2021   CHOLHDL 4 04/13/2020   Lab Results  Component Value Date   LDLDIRECT 109.0 09/21/2009    Simvastatin 20 mg daily   Prediabetes Lab Results  Component Value Date   HGBA1C 6.0 04/11/2022  This is stable  Taking semaglutide 1 mg weekly Tolerating ok- feels nauseated the day after he takes it and then does well   Wants to go up on dose    Gets full faster  Does not crave sweets or greasy foods   Lab Results  Component Value Date   CREATININE 1.22 04/11/2022   BUN 17 04/11/2022   NA 139 04/11/2022   K 4.4 04/11/2022   CL 103 04/11/2022   CO2 30 04/11/2022   Lab Results  Component Value Date   ALT 35 04/11/2022   AST 23 04/11/2022   ALKPHOS 58 04/11/2022   BILITOT 0.5 04/11/2022   Lab Results  Component Value Date   WBC  6.4 04/11/2022   HGB 13.4 04/11/2022   HCT 40.9 04/11/2022   MCV 89.1 04/11/2022   PLT 226.0 04/11/2022  . Lab Results  Component Value Date   TSH 1.08 04/11/2022    Patient Active Problem List   Diagnosis Date Noted   Insomnia 04/17/2021   Neck pain 04/17/2021   Prediabetes 04/10/2020   Eczema 08/24/2015   Cerumen impaction 06/16/2014   Low testosterone 11/09/2013   Prostate cancer screening 11/02/2013   Adenomatous colon polyp 07/28/2012   Family history of malignant neoplasm of gastrointestinal tract 06/29/2012   Anal fissure 06/29/2012   Personal history of colonic polyps 06/29/2012   Routine general medical examination at a health care facility 09/17/2011   Morbid obesity ([place]) 01/01/2010   Allergic rhinitis 10/13/2008   Obstructive sleep apnea 08/11/2007   Obsessive-compulsive disorder 01/21/2007   ACTINIC SKIN DAMAGE 01/21/2007   Hyperlipidemia 01/13/2007   Essential hypertension 01/13/2007   Past Medical History:  Diagnosis Date   Anxiety states    Dermatophytosis of nail    Obsessive-compulsive disorders    Obstructive sleep apnea (adult) (pediatric)    Other and unspecified hyperlipidemia    Seasonal allergic rhinitis    Unspecified dermatitis due to sun    Unspecified essential hypertension    Unspecified sleep apnea    Past Surgical History:  Procedure Laterality Date   COLONOSCOPY  11/02, 11/05   polyps   CYSTECTOMY     cyst on hand   Social History   Tobacco Use   Smoking status: Never   Smokeless tobacco: Never  Vaping Use   Vaping Use: Never used  Substance Use Topics   Alcohol use: Yes    Alcohol/week: 0.0 standard drinks of alcohol    Comment: occasionally,1 beer once month   Drug use: No   Family History  Problem Relation Age of Onset   Hypertension Father    Diabetes type II Father    Cancer - Other Father 57       Adrenal Cancer   Colon cancer Mother 92   Prostate cancer Neg Hx    Allergies  Allergen Reactions    Shellfish Allergy Shortness Of Breath and Other (See Comments)    Reaction=tunnel vision   Amlodipine Besy-Benazepril Hcl     REACTION: Generic med did not adequatly control  htnh   Sertraline Hcl Nausea Only    REACTION: nausea, ED   Current Outpatient Medications on File Prior to Visit  Medication Sig Dispense Refill   aspirin 81 MG tablet Take 81 mg by mouth daily.       Azelastine HCl (ASTEPRO) 0.15 % SOLN Place 1 spray into both nostrils 2 (two) times daily. (Patient taking differently: Place 1 spray into both nostrils 2 (two) times daily as needed.) 90 mL 3   Docusate Sodium 100 MG capsule Take 100 mg by mouth daily.     Fexofenadine HCl (ALLEGRA PO) Take 1 tablet by mouth daily. As directed     fish oil-omega-3 fatty acids 1000 MG capsule Take 1 g by mouth daily.      gabapentin (NEURONTIN) 300 MG capsule Take 1 capsule (300 mg total) by mouth at bedtime. 90 capsule 1   hydrocortisone (ANUSOL-HC) 25 MG suppository Place 1 suppository (25 mg total) rectally every evening. As needed for 7 days as needed for rectal pain or bleeding 14 suppository 2   Melatonin 5 MG TABS Take 10 mg by mouth at bedtime.     methocarbamol (ROBAXIN) 500 MG tablet Take 1 tablet (500 mg total) by mouth every 8 (eight) hours as needed for muscle spasms. 90 tablet 2   Multiple Vitamin (MULTIVITAMIN) capsule Take 1 capsule by mouth daily.     triamcinolone cream (KENALOG) 0.5 % Apply 1 application topically 2 (two) times daily. To affected area 30 g 0   No current facility-administered medications on file prior to visit.      Review of Systems  Constitutional:  Negative for activity change, appetite change, fatigue, fever and unexpected weight change.  HENT:  Negative for congestion, rhinorrhea, sore throat and trouble swallowing.   Eyes:  Negative for pain, redness, itching and visual disturbance.  Respiratory:  Negative for cough, chest tightness, shortness of breath and wheezing.   Cardiovascular:   Negative for chest pain and palpitations.  Gastrointestinal:  Negative for abdominal pain, blood in stool, constipation, diarrhea and nausea.       Gets nauseated the day after his semaglutide shot and it resolves quickly  Endocrine: Negative for cold intolerance, heat intolerance, polydipsia and polyuria.  Genitourinary:  Negative for difficulty urinating, dysuria, frequency and urgency.  Musculoskeletal:  Negative for arthralgias, joint swelling and myalgias.  Skin:  Negative for pallor and rash.  Neurological:  Negative for dizziness, tremors, weakness, numbness and headaches.  Hematological:  Negative for adenopathy. Does not bruise/bleed easily.  Psychiatric/Behavioral:  Negative for decreased concentration and dysphoric mood. The patient is not nervous/anxious.        Objective:   Physical Exam Constitutional:      General: He is not in acute distress.    Appearance: Normal appearance. He is well-developed. He is obese. He is not ill-appearing or diaphoretic.  HENT:     Head: Normocephalic and atraumatic.     Right Ear: Tympanic membrane, ear canal and external ear normal. There is impacted cerumen.     Left Ear: Tympanic membrane, ear canal and external ear normal. There is impacted cerumen.     Ears:     Comments: Dry cerumen bilat    Nose: Nose normal. No congestion.     Mouth/Throat:     Mouth: Mucous membranes are moist.     Pharynx: Oropharynx is clear. No posterior oropharyngeal erythema.  Eyes:     General: No scleral icterus.       Right  eye: No discharge.        Left eye: No discharge.     Conjunctiva/sclera: Conjunctivae normal.     Pupils: Pupils are equal, round, and reactive to light.  Neck:     Thyroid: No thyromegaly.     Vascular: No carotid bruit or JVD.  Cardiovascular:     Rate and Rhythm: Normal rate and regular rhythm.     Pulses: Normal pulses.     Heart sounds: Normal heart sounds.     No gallop.  Pulmonary:     Effort: Pulmonary effort is  normal. No respiratory distress.     Breath sounds: Normal breath sounds. No wheezing or rales.     Comments: Good air exch Chest:     Chest wall: No tenderness.  Abdominal:     General: Bowel sounds are normal. There is no distension or abdominal bruit.     Palpations: Abdomen is soft. There is no mass.     Tenderness: There is no abdominal tenderness.     Hernia: No hernia is present.  Musculoskeletal:        General: No tenderness.     Cervical back: Normal range of motion and neck supple. No rigidity. No muscular tenderness.     Right lower leg: No edema.     Left lower leg: No edema.  Lymphadenopathy:     Cervical: No cervical adenopathy.  Skin:    General: Skin is warm and dry.     Coloration: Skin is not pale.     Findings: No erythema or rash.     Comments: Solar lentigines diffusely   Neurological:     Mental Status: He is alert.     Cranial Nerves: No cranial nerve deficit.     Motor: No abnormal muscle tone.     Coordination: Coordination normal.     Gait: Gait normal.     Deep Tendon Reflexes: Reflexes are normal and symmetric. Reflexes normal.  Psychiatric:        Mood and Affect: Mood normal.        Cognition and Memory: Cognition normal.           Assessment & Plan:   Problem List Items Addressed This Visit       Cardiovascular and Mediastinum   Essential hypertension    bp in fair control at this time  BP Readings from Last 1 Encounters:  04/18/22 132/79  No changes needed Most recent labs reviewed  Disc lifstyle change with low sodium diet and exercise  Plan to continue DAW lotrel 5-10 mg daily  In the past generic did not work for bp      Relevant Medications   LOTREL 5-10 MG capsule   simvastatin (ZOCOR) 20 MG tablet     Respiratory   Obstructive sleep apnea    Continues CPAP Working on weight loss         Digestive   Adenomatous colon polyp    Due for colonoscopy  He will call us if he needs referral in oct        Other    Family history of malignant neoplasm of gastrointestinal tract    Due for colonoscopy in nov      Hyperlipidemia    Disc goals for lipids and reasons to control them Rev last labs with pt Rev low sat fat diet in detail LDL is improved but HDL still low   inst to work on this by adding more exercise Also  more healthy fish intake       Relevant Medications   LOTREL 5-10 MG capsule   simvastatin (ZOCOR) 20 MG tablet   Low testosterone    Off clomid because he cannot get it  Level of 312 - per pt goal was over 500      Morbid obesity ([place])    Discussed how this problem influences overall health and the risks it imposes  Reviewed plan for weight loss with lower calorie diet (via better food choices and also portion control or program like weight watchers) and exercise building up to or more than 30 minutes 5 days per week including some aerobic activity   Making some progress with semaglutide for wt loss  Will inc dose to 2 mg and see if tolerated        Relevant Medications   Semaglutide, 2 MG/DOSE, 8 MG/3ML SOPN   Obsessive-compulsive disorder    Continues to do well with lexapro  Encouraged self care Reviewed stressors/ coping techniques/symptoms/ support sources/ tx options and side effects in detail today       Relevant Medications   escitalopram (LEXAPRO) 20 MG tablet   Prediabetes    Lab Results  Component Value Date   HGBA1C 6.0 04/11/2022  disc imp of low glycemic diet and wt loss to prevent DM2  Plan to continue generic semaglutide       Prostate cancer screening    Lab Results  Component Value Date   PSA 0.71 04/11/2022   PSA 0.63 04/10/2021   PSA 0.92 04/13/2020   No urinary changes       Routine general medical examination at a health care facility - Primary    Reviewed health habits including diet and exercise and skin cancer prevention Reviewed appropriate screening tests for age  Also reviewed health mt list, fam hx and immunization status , as  well as social and family history   See HPI Labs reviewed Plans flu shot in the fall  Colonoscopy is due in November-will call for ref if needed  psa is stable  Enc to continue working on weight loss

## 2022-04-20 NOTE — Assessment & Plan Note (Signed)
Off clomid because he cannot get it  Level of 312 - per pt goal was over 500

## 2022-04-20 NOTE — Assessment & Plan Note (Signed)
bp in fair control at this time  BP Readings from Last 1 Encounters:  04/18/22 132/79   No changes needed Most recent labs reviewed  Disc lifstyle change with low sodium diet and exercise  Plan to continue DAW lotrel 5-10 mg daily  In the past generic did not work for bp

## 2022-04-20 NOTE — Assessment & Plan Note (Signed)
Disc goals for lipids and reasons to control them Rev last labs with pt Rev low sat fat diet in detail LDL is improved but HDL still low   inst to work on this by adding more exercise Also more healthy fish intake

## 2022-04-20 NOTE — Assessment & Plan Note (Signed)
Continues CPAP Working on weight loss

## 2022-04-20 NOTE — Assessment & Plan Note (Signed)
Due for colonoscopy  He will call us if he needs referral in oct

## 2022-04-20 NOTE — Assessment & Plan Note (Signed)
Reviewed health habits including diet and exercise and skin cancer prevention Reviewed appropriate screening tests for age  Also reviewed health mt list, fam hx and immunization status , as well as social and family history   See HPI Labs reviewed Plans flu shot in the fall  Colonoscopy is due in November-will call for ref if needed  psa is stable  Enc to continue working on weight loss

## 2022-04-20 NOTE — Assessment & Plan Note (Signed)
Lab Results  Component Value Date   HGBA1C 6.0 04/11/2022   disc imp of low glycemic diet and wt loss to prevent DM2  Plan to continue generic semaglutide

## 2022-04-20 NOTE — Assessment & Plan Note (Signed)
Discussed how this problem influences overall health and the risks it imposes  Reviewed plan for weight loss with lower calorie diet (via better food choices and also portion control or program like weight watchers) and exercise building up to or more than 30 minutes 5 days per week including some aerobic activity   Making some progress with semaglutide for wt loss  Will inc dose to 2 mg and see if tolerated

## 2022-04-20 NOTE — Assessment & Plan Note (Signed)
Due for colonoscopy in nov

## 2022-04-20 NOTE — Assessment & Plan Note (Signed)
Lab Results  Component Value Date   PSA 0.71 04/11/2022   PSA 0.63 04/10/2021   PSA 0.92 04/13/2020    No urinary changes

## 2022-04-20 NOTE — Assessment & Plan Note (Signed)
Continues to do well with lexapro  Encouraged self care Reviewed stressors/ coping techniques/symptoms/ support sources/ tx options and side effects in detail today

## 2022-04-25 ENCOUNTER — Telehealth: Payer: Self-pay

## 2022-04-25 ENCOUNTER — Telehealth: Payer: Self-pay | Admitting: Gastroenterology

## 2022-04-25 NOTE — Telephone Encounter (Signed)
Good Morning Dr Havery Moros,  We have received a request from patient for transfer of care to Dr Bryan Lemma. Patient would like to see the same provider as his wife. Please advise.  Thank you

## 2022-04-25 NOTE — Telephone Encounter (Signed)
We received a fax from [facility].  This letter states:  You or your doctor recently requested prescription coverage for Lotrel Cap 5-'10MG'$ .  After careful consideration and review of your prescription plan's drug list and the information sent to [place], this request was not approved.   Why your request was denied: *Co-pay Issues - Your request for a lower co-pay is denied.  You are paying the lowest co-pay possible for the requested drug according to your prescription benefit plan.  This letter has been sent to [place].

## 2022-04-28 NOTE — Telephone Encounter (Signed)
Aware, thanks  Prior Jeffrey Hartman was done

## 2022-04-28 NOTE — Telephone Encounter (Signed)
Fine with me if okay with Dr. Bryan Lemma.

## 2022-04-29 ENCOUNTER — Ambulatory Visit (INDEPENDENT_AMBULATORY_CARE_PROVIDER_SITE_OTHER): Payer: Managed Care, Other (non HMO) | Admitting: Family Medicine

## 2022-04-29 ENCOUNTER — Encounter: Payer: Self-pay | Admitting: Family Medicine

## 2022-04-29 VITALS — BP 126/80 | HR 74 | Temp 97.7°F | Ht 70.75 in | Wt 285.2 lb

## 2022-04-29 DIAGNOSIS — I10 Essential (primary) hypertension: Secondary | ICD-10-CM | POA: Diagnosis not present

## 2022-04-29 DIAGNOSIS — H6123 Impacted cerumen, bilateral: Secondary | ICD-10-CM

## 2022-04-29 NOTE — Assessment & Plan Note (Signed)
Bilateral recurrent with reduced hearing   After consent obtained simple ear canal irrigation performed with success Pt tolerated procedure well   inst to use debrox or peroxide twice monthly to keep cerumen more mobile

## 2022-04-29 NOTE — Patient Instructions (Addendum)
Ears look good after irritation   Debrox or peroxide may help 1-2 times monthly to keep the wax mobile  Let us know if no further improvement or if you have any pain or other symptoms

## 2022-04-29 NOTE — Progress Notes (Signed)
Subjective:    Patient ID: Jeffrey Hartman, male    DOB: Jan 27, 1961, 61 y.o.   MRN: [id number]  HPI Pt presents for cerumen impaction   Wt Readings from Last 3 Encounters:  04/29/22 285 lb 4 oz (129.4 kg)  04/18/22 286 lb 12.8 oz (130.1 kg)  01/10/22 299 lb 2 oz (135.7 kg)   40.07 kg/m  Not using anything in ears for a week (prior to that debrox)  They are full  Reduced hearing   Last ear flush was about a year ago   Patient Active Problem List   Diagnosis Date Noted   Insomnia 04/17/2021   Neck pain 04/17/2021   Prediabetes 04/10/2020   Eczema 08/24/2015   Cerumen impaction 06/16/2014   Low testosterone 11/09/2013   Prostate cancer screening 11/02/2013   Adenomatous colon polyp 07/28/2012   Family history of malignant neoplasm of gastrointestinal tract 06/29/2012   Anal fissure 06/29/2012   Personal history of colonic polyps 06/29/2012   Routine general medical examination at a health care facility 09/17/2011   Morbid obesity ([place]) 01/01/2010   Allergic rhinitis 10/13/2008   Obstructive sleep apnea 08/11/2007   Obsessive-compulsive disorder 01/21/2007   ACTINIC SKIN DAMAGE 01/21/2007   Hyperlipidemia 01/13/2007   Essential hypertension 01/13/2007   Past Medical History:  Diagnosis Date   Anxiety states    Dermatophytosis of nail    Obsessive-compulsive disorders    Obstructive sleep apnea (adult) (pediatric)    Other and unspecified hyperlipidemia    Seasonal allergic rhinitis    Unspecified dermatitis due to sun    Unspecified essential hypertension    Unspecified sleep apnea    Past Surgical History:  Procedure Laterality Date   COLONOSCOPY  11/02, 11/05   polyps   CYSTECTOMY     cyst on hand   Social History   Tobacco Use   Smoking status: Never   Smokeless tobacco: Never  Vaping Use   Vaping Use: Never used  Substance Use Topics   Alcohol use: Yes    Alcohol/week: 0.0 standard drinks of alcohol    Comment: occasionally,1 beer once month    Drug use: No   Family History  Problem Relation Age of Onset   Hypertension Father    Diabetes type II Father    Cancer - Other Father 85       Adrenal Cancer   Colon cancer Mother 28   Prostate cancer Neg Hx    Allergies  Allergen Reactions   Shellfish Allergy Shortness Of Breath and Other (See Comments)    Reaction=tunnel vision   Amlodipine Besy-Benazepril Hcl     REACTION: Generic med did not adequatly control htnh   Sertraline Hcl Nausea Only    REACTION: nausea, ED   Current Outpatient Medications on File Prior to Visit  Medication Sig Dispense Refill   aspirin 81 MG tablet Take 81 mg by mouth daily.       Azelastine HCl (ASTEPRO) 0.15 % SOLN Place 1 spray into both nostrils 2 (two) times daily. (Patient taking differently: Place 1 spray into both nostrils 2 (two) times daily as needed.) 90 mL 3   Docusate Sodium 100 MG capsule Take 100 mg by mouth daily.     escitalopram (LEXAPRO) 20 MG tablet Take 1 tablet (20 mg total) by mouth daily. 90 tablet 3   Fexofenadine HCl (ALLEGRA PO) Take 1 tablet by mouth daily. As directed     fish oil-omega-3 fatty acids 1000 MG capsule Take 1 g  by mouth daily.      fluticasone (FLONASE) 50 MCG/ACT nasal spray Place 2 sprays into both nostrils daily as needed. 48 g 3   gabapentin (NEURONTIN) 300 MG capsule Take 1 capsule (300 mg total) by mouth at bedtime. 90 capsule 1   hydrocortisone (ANUSOL-HC) 25 MG suppository Place 1 suppository (25 mg total) rectally every evening. As needed for 7 days as needed for rectal pain or bleeding 14 suppository 2   LOTREL 5-10 MG capsule Take 1 capsule by mouth daily. 90 capsule 3   Melatonin 5 MG TABS Take 10 mg by mouth at bedtime.     methocarbamol (ROBAXIN) 500 MG tablet Take 1 tablet (500 mg total) by mouth every 8 (eight) hours as needed for muscle spasms. 90 tablet 2   Multiple Vitamin (MULTIVITAMIN) capsule Take 1 capsule by mouth daily.     Semaglutide, 2 MG/DOSE, 8 MG/3ML SOPN Inject 2 mg as  directed once a week. 3 mL 3   simvastatin (ZOCOR) 20 MG tablet Take 1 tablet (20 mg total) by mouth daily. 90 tablet 3   triamcinolone cream (KENALOG) 0.5 % Apply 1 application topically 2 (two) times daily. To affected area 30 g 0   No current facility-administered medications on file prior to visit.     Review of Systems  Constitutional:  Negative for activity change, appetite change, fatigue, fever and unexpected weight change.  HENT:  Positive for hearing loss. Negative for congestion, ear discharge, ear pain, rhinorrhea, sore throat and trouble swallowing.   Eyes:  Negative for pain, redness, itching and visual disturbance.  Respiratory:  Negative for cough, chest tightness, shortness of breath and wheezing.   Cardiovascular:  Negative for chest pain and palpitations.  Gastrointestinal:  Negative for abdominal pain, blood in stool, constipation, diarrhea and nausea.  Endocrine: Negative for cold intolerance, heat intolerance, polydipsia and polyuria.  Genitourinary:  Negative for difficulty urinating, dysuria, frequency and urgency.  Musculoskeletal:  Negative for arthralgias, joint swelling and myalgias.  Skin:  Negative for pallor and rash.  Neurological:  Negative for dizziness, tremors, weakness, numbness and headaches.  Hematological:  Negative for adenopathy. Does not bruise/bleed easily.  Psychiatric/Behavioral:  Negative for decreased concentration and dysphoric mood. The patient is not nervous/anxious.        Objective:   Physical Exam Constitutional:      General: He is not in acute distress.    Appearance: Normal appearance. He is obese. He is not ill-appearing or diaphoretic.  HENT:     Head: Normocephalic and atraumatic.     Right Ear: Tympanic membrane normal. There is impacted cerumen.     Left Ear: Tympanic membrane normal. There is impacted cerumen.     Ears:     Comments: Bilateral dry cerumen impaction   Procedure: Cerumen Disimpaction:  Warm water was  applied and gentle ear lavage performed on   ear.    There were no complications and following the disimpaction the tympanic membrane were visible on the bilateral. Tympanic membranes are intact following the procedure.  Auditory canals are normal.  The patient reported relief of symptoms after removal of cerumen.  S/p simple irrigation- TMs appear clear and normal  Nl appearing canal without erythema or swelling or injury    Mouth/Throat:     Mouth: Mucous membranes are moist.  Skin:    General: Skin is warm and dry.     Findings: No erythema.  Neurological:     Mental Status: He is alert.  Cranial Nerves: No cranial nerve deficit.  Psychiatric:        Mood and Affect: Mood normal.           Assessment & Plan:   Problem List Items Addressed This Visit       Cardiovascular and Mediastinum   Essential hypertension    bp in fair control at this time  BP Readings from Last 1 Encounters:  04/29/22 126/80  No changes needed-has to take DAW lotrel 5-10 mg daily  In past generic does not work  Form /auth sent to his pharmacy for this  Most recent labs reviewed  Disc lifstyle change with low sodium diet and exercise          Nervous and Auditory   Cerumen impaction - Primary    Bilateral recurrent with reduced hearing   After consent obtained simple ear canal irrigation performed with success Pt tolerated procedure well   inst to use debrox or peroxide twice monthly to keep cerumen more mobile         Other   Morbid obesity ([place])    Going up on semaglutide to 2 mg with next dosing cycle Tolerating well so far  Feels good Wt is down another lb

## 2022-04-29 NOTE — Assessment & Plan Note (Signed)
bp in fair control at this time  BP Readings from Last 1 Encounters:  04/29/22 126/80   No changes needed-has to take DAW lotrel 5-10 mg daily  In past generic does not work  Form /auth sent to his pharmacy for this  Most recent labs reviewed  Disc lifstyle change with low sodium diet and exercise

## 2022-04-29 NOTE — Assessment & Plan Note (Signed)
Going up on semaglutide to 2 mg with next dosing cycle Tolerating well so far  Feels good Wt is down another lb

## 2022-05-07 NOTE — Telephone Encounter (Signed)
Dr. Bryan Lemma are you okay with taking on care for patient?

## 2022-05-08 NOTE — Telephone Encounter (Signed)
Ok with me. Thanks.  

## 2022-05-08 NOTE — Telephone Encounter (Signed)
LVM advising patient Dr. Bryan Lemma had approved th transfer of care.

## 2022-05-09 NOTE — Telephone Encounter (Signed)
Unfortunately no, skin tags do not get removed at the time of colonoscopy.  In fact, I do not perform skin tag surgery at all, but instead would require referral to the Colorectal Surgery clinic.  I tend to reserve referral for skin tag removal for patients with significant symptoms or significantly enlarged skin tags due to prolonged recovery process.  Can discuss based on size/appearance at time of colonoscopy.

## 2022-05-09 NOTE — Telephone Encounter (Signed)
Good Afternoon Dr. Bryan Lemma,  Patient returned my call from yesterday about scheduling his colonoscopy. Patient wanted me to mention to you that in the past he had discussed with Dr. Havery Moros about a skin tag on his rectum and is wanting to know if there is anyway you can remove it during his colonoscopy. Please advise.  Thank you!

## 2022-05-13 NOTE — Telephone Encounter (Signed)
Called patient and advised that at the time of colonoscopy the skin tag would not be able to be removed. And that he could be referred. Patient advised understanding.

## 2022-05-16 ENCOUNTER — Telehealth: Payer: Self-pay

## 2022-05-16 NOTE — Telephone Encounter (Signed)
Prior auth started for Ozempic (2 MG/DOSE) '8MG'$ /3ML pen-injectors. Georgiann Mohs (KeyMosie Epstein) Rx #: [id number] Waiting for determination.

## 2022-05-19 ENCOUNTER — Encounter: Payer: Self-pay | Admitting: Family Medicine

## 2022-05-19 NOTE — Telephone Encounter (Signed)
Prior auth for Ozempic (2 MG/DOSE) '8MG'$ /3ML pen-injectors has been denied. Georgiann Mohs (KeyMosie Epstein) Rx #: [id number] Your plan only covers this drug when it is used for certain health conditions. Covered use is for type 2 diabetes mellitus.  Denial letter sent to scanning.

## 2022-05-20 NOTE — Telephone Encounter (Signed)
Renewal prior authorization for Ozempic (2 MG/DOSE) '8MG'$ /3ML pen-injectors has been denied. Georgiann Mohs (Key: [id number]) Your plan only covers this drug when it is used for certain health conditions. Covered use is for type 2 diabetes mellitus.   Denial letter sent to scanning.

## 2022-05-26 ENCOUNTER — Encounter: Payer: Self-pay | Admitting: Gastroenterology

## 2022-05-27 MED ORDER — SEMAGLUTIDE (2 MG/DOSE) 8 MG/3ML ~~LOC~~ SOPN: 2.0000 mg | PEN_INJECTOR | SUBCUTANEOUS | 3 refills | Status: DC

## 2022-06-02 MED ORDER — WEGOVY 1 MG/0.5ML ~~LOC~~ SOAJ: 1.0000 mg | SUBCUTANEOUS | 3 refills | Status: DC

## 2022-06-02 NOTE — Addendum Note (Signed)
Addended by: Loura Pardon A on: 06/02/2022 08:32 PM   Modules accepted: Orders

## 2022-06-26 ENCOUNTER — Ambulatory Visit (AMBULATORY_SURGERY_CENTER): Payer: Self-pay

## 2022-06-26 ENCOUNTER — Telehealth: Payer: Self-pay | Admitting: *Deleted

## 2022-06-26 DIAGNOSIS — Z86010 Personal history of colonic polyps: Secondary | ICD-10-CM

## 2022-06-26 MED ORDER — SEMAGLUTIDE-WEIGHT MANAGEMENT 1.7 MG/0.75ML ~~LOC~~ SOAJ: 1.7000 mg | SUBCUTANEOUS | 3 refills | Status: DC

## 2022-06-26 MED ORDER — NA SULFATE-K SULFATE-MG SULF 17.5-3.13-1.6 GM/177ML PO SOLN: 1.0000 | Freq: Once | ORAL | 0 refills | Status: AC

## 2022-06-26 NOTE — Progress Notes (Signed)

## 2022-06-26 NOTE — Telephone Encounter (Signed)
Pt sent a message saying:  Hi Dr Glori Bickers,  Next week is our last Wegovy shot at '1mg'$ .  We've found the the '1mg'$  Mancel Parsons is not as strong as the '1mg'$  Ozempic.  Can you go ahead a prescribe [place] the next round of Wegovy at 1.'7mg'$  to [facility]?  You might want to put some refills on it too. :-)  Thanks  Gershon Mussel

## 2022-07-04 ENCOUNTER — Encounter: Payer: Managed Care, Other (non HMO) | Admitting: Gastroenterology

## 2022-07-10 ENCOUNTER — Other Ambulatory Visit: Payer: Self-pay | Admitting: Family Medicine

## 2022-07-15 ENCOUNTER — Encounter: Payer: Self-pay | Admitting: Gastroenterology

## 2022-07-21 ENCOUNTER — Encounter: Payer: Managed Care, Other (non HMO) | Admitting: Gastroenterology

## 2022-07-21 ENCOUNTER — Ambulatory Visit (AMBULATORY_SURGERY_CENTER): Payer: Managed Care, Other (non HMO) | Admitting: Gastroenterology

## 2022-07-21 ENCOUNTER — Encounter: Payer: Self-pay | Admitting: Gastroenterology

## 2022-07-21 VITALS — BP 121/82 | HR 77 | Temp 98.9°F | Resp 19 | Ht 70.0 in | Wt 269.0 lb

## 2022-07-21 DIAGNOSIS — Z800 Family history of malignant neoplasm of digestive organs: Secondary | ICD-10-CM

## 2022-07-21 DIAGNOSIS — Z09 Encounter for follow-up examination after completed treatment for conditions other than malignant neoplasm: Secondary | ICD-10-CM | POA: Diagnosis present

## 2022-07-21 DIAGNOSIS — K5730 Diverticulosis of large intestine without perforation or abscess without bleeding: Secondary | ICD-10-CM

## 2022-07-21 DIAGNOSIS — D125 Benign neoplasm of sigmoid colon: Secondary | ICD-10-CM | POA: Diagnosis not present

## 2022-07-21 DIAGNOSIS — Z86010 Personal history of colonic polyps: Secondary | ICD-10-CM | POA: Diagnosis not present

## 2022-07-21 DIAGNOSIS — D122 Benign neoplasm of ascending colon: Secondary | ICD-10-CM | POA: Diagnosis not present

## 2022-07-21 DIAGNOSIS — K635 Polyp of colon: Secondary | ICD-10-CM

## 2022-07-21 MED ORDER — SEMAGLUTIDE-WEIGHT MANAGEMENT 2.4 MG/0.75ML ~~LOC~~ SOAJ: 2.4000 mg | SUBCUTANEOUS | 3 refills | Status: DC

## 2022-07-21 MED ORDER — SODIUM CHLORIDE 0.9 % IV SOLN: 500.0000 mL | INTRAVENOUS | Status: DC

## 2022-07-21 NOTE — Progress Notes (Signed)
Pt's states no medical or surgical changes since previsit or office visit. 

## 2022-07-21 NOTE — Progress Notes (Signed)
Pt resting comfortably. VSS. Airway intact. SBAR complete to RN. All questions answered.   

## 2022-07-21 NOTE — Progress Notes (Signed)
GASTROENTEROLOGY PROCEDURE H&P NOTE   Primary Care Physician: Tower, Wynelle Fanny, MD    Reason for Procedure:  Colon Cancer screening, colon polyp surveillance  Plan:    Colonoscopy  Patient is appropriate for endoscopic procedure(s) in the ambulatory ([facility]) setting.  The nature of the procedure, as well as the risks, benefits, and alternatives were carefully and thoroughly reviewed with the patient. Ample time for discussion and questions allowed. The patient understood, was satisfied, and agreed to proceed.     HPI: Jeffrey Hartman is a 61 y.o. male who presents for colonoscopy for routine Colon Cancer screening and continued colon polyp surveillance.  No active GI symptoms.  Family history notable for mother with colon cancer at age 42.  Last colonoscopy was 07/21/2017 and notable for 3 mm polyp in the ascending colon 4 mm transverse polyp, 3 mm descending polyp (path tubular adenoma x2, HP x1).  Descending diverticulosis, internal hemorrhoids with stigmata of recent banding.  Recommended repeat in 5 years.  Prior to that, colonoscopy 07/23/2012 with 3 mm ascending polyp, 3 mm splenic flexure polyp with recommendation to repeat in 5 years.  Underwent hemorrhoid banding in 2019 with Dr. Havery Moros.  Past Medical History:  Diagnosis Date   Anxiety states    Dermatophytosis of nail    Obsessive-compulsive disorders    Obstructive sleep apnea (adult) (pediatric)    Other and unspecified hyperlipidemia    Seasonal allergic rhinitis    Sleep apnea    Unspecified dermatitis due to sun    Unspecified essential hypertension    Unspecified sleep apnea     Past Surgical History:  Procedure Laterality Date   COLONOSCOPY  11/02, 11/05   polyps   CYSTECTOMY     cyst on hand    Prior to Admission medications   Medication Sig Start Date End Date Taking? Authorizing Provider  aspirin 81 MG tablet Take 81 mg by mouth daily.     Yes [provider]  escitalopram (LEXAPRO)  20 MG tablet Take 1 tablet (20 mg total) by mouth daily. 04/18/22  Yes Tower, Wynelle Fanny, MD  Fexofenadine HCl (ALLEGRA PO) Take 1 tablet by mouth daily. As directed   Yes [provider]  fish oil-omega-3 fatty acids 1000 MG capsule Take 1 g by mouth daily.    Yes [provider]  fluticasone (FLONASE) 50 MCG/ACT nasal spray Place 2 sprays into both nostrils daily as needed. 04/18/22  Yes Tower, Wynelle Fanny, MD  folic acid (FOLVITE) 532 MCG tablet Take 400 mcg by mouth daily.   Yes [provider]  glucosamine-chondroitin 500-400 MG tablet Take 1 tablet by mouth 3 (three) times daily.   Yes [provider]  LOTREL 5-10 MG capsule TAKE 1 CAPSULE BY MOUTH EVERY DAY 07/10/22  Yes Tower, Wynelle Fanny, MD  Multiple Vitamin (MULTIVITAMIN) capsule Take 1 capsule by mouth daily.   Yes [provider]  simvastatin (ZOCOR) 20 MG tablet Take 1 tablet (20 mg total) by mouth daily. 04/18/22  Yes Tower, Wynelle Fanny, MD  Turmeric (QC TUMERIC COMPLEX) 500 MG CAPS Take by mouth.   Yes [provider]  Azelastine HCl (ASTEPRO) 0.15 % SOLN Place 1 spray into both nostrils 2 (two) times daily. Patient not taking: Reported on 07/21/2022 08/24/15   Tower, Wynelle Fanny, MD  Docusate Sodium 100 MG capsule Take 100 mg by mouth daily.    [provider]  gabapentin (NEURONTIN) 300 MG capsule Take 1 capsule (300 mg total) by  mouth at bedtime. Patient not taking: Reported on 06/26/2022 07/31/21   Tower, Wynelle Fanny, MD  hydrocortisone (ANUSOL-HC) 25 MG suppository Place 1 suppository (25 mg total) rectally every evening. As needed for 7 days as needed for rectal pain or bleeding Patient not taking: Reported on 06/26/2022 06/29/12   Inda Castle, MD  Melatonin 5 MG TABS Take 10 mg by mouth at bedtime. Patient not taking: Reported on 06/26/2022    [provider]  methocarbamol (ROBAXIN) 500 MG tablet Take 1 tablet (500 mg total) by mouth every 8 (eight) hours as needed for  muscle spasms. Patient not taking: Reported on 06/26/2022 07/31/21   Tower, Wynelle Fanny, MD  Semaglutide-Weight Management 1.7 MG/0.75ML SOAJ Inject 1.7 mg into the skin once a week. 06/26/22   Tower, Wynelle Fanny, MD  triamcinolone cream (KENALOG) 0.5 % Apply 1 application topically 2 (two) times daily. To affected area Patient not taking: Reported on 06/26/2022 04/05/21   Abner Greenspan, MD    Current Outpatient Medications  Medication Sig Dispense Refill   aspirin 81 MG tablet Take 81 mg by mouth daily.       escitalopram (LEXAPRO) 20 MG tablet Take 1 tablet (20 mg total) by mouth daily. 90 tablet 3   Fexofenadine HCl (ALLEGRA PO) Take 1 tablet by mouth daily. As directed     fish oil-omega-3 fatty acids 1000 MG capsule Take 1 g by mouth daily.      fluticasone (FLONASE) 50 MCG/ACT nasal spray Place 2 sprays into both nostrils daily as needed. 48 g 3   folic acid (FOLVITE) 098 MCG tablet Take 400 mcg by mouth daily.     glucosamine-chondroitin 500-400 MG tablet Take 1 tablet by mouth 3 (three) times daily.     LOTREL 5-10 MG capsule TAKE 1 CAPSULE BY MOUTH EVERY DAY 90 capsule 1   Multiple Vitamin (MULTIVITAMIN) capsule Take 1 capsule by mouth daily.     simvastatin (ZOCOR) 20 MG tablet Take 1 tablet (20 mg total) by mouth daily. 90 tablet 3   Turmeric (QC TUMERIC COMPLEX) 500 MG CAPS Take by mouth.     Azelastine HCl (ASTEPRO) 0.15 % SOLN Place 1 spray into both nostrils 2 (two) times daily. (Patient not taking: Reported on 07/21/2022) 90 mL 3   Docusate Sodium 100 MG capsule Take 100 mg by mouth daily.     gabapentin (NEURONTIN) 300 MG capsule Take 1 capsule (300 mg total) by mouth at bedtime. (Patient not taking: Reported on 06/26/2022) 90 capsule 1   hydrocortisone (ANUSOL-HC) 25 MG suppository Place 1 suppository (25 mg total) rectally every evening. As needed for 7 days as needed for rectal pain or bleeding (Patient not taking: Reported on 06/26/2022) 14 suppository 2   Melatonin 5 MG TABS Take  10 mg by mouth at bedtime. (Patient not taking: Reported on 06/26/2022)     methocarbamol (ROBAXIN) 500 MG tablet Take 1 tablet (500 mg total) by mouth every 8 (eight) hours as needed for muscle spasms. (Patient not taking: Reported on 06/26/2022) 90 tablet 2   Semaglutide-Weight Management 1.7 MG/0.75ML SOAJ Inject 1.7 mg into the skin once a week. 3 mL 3   triamcinolone cream (KENALOG) 0.5 % Apply 1 application topically 2 (two) times daily. To affected area (Patient not taking: Reported on 06/26/2022) 30 g 0   Current Facility-Administered Medications  Medication Dose Route Frequency Provider Last Rate Last Admin   0.9 %  sodium chloride infusion  500 mL Intravenous Continuous Chesni Vos,  Armon Orvis V, DO        Allergies as of 07/21/2022 - Review Complete 07/21/2022  Allergen Reaction Noted   Shellfish allergy Shortness Of Breath and Other (See Comments) 07/12/2012   Amlodipine besy-benazepril hcl     Sertraline hcl Nausea Only 02/01/2007    Family History  Problem Relation Age of Onset   Colon cancer Mother 106   Hypertension Father    Diabetes type II Father    Cancer - Other Father 57       Adrenal Cancer   Prostate cancer Neg Hx    Colon polyps Neg Hx    Esophageal cancer Neg Hx    Stomach cancer Neg Hx    Rectal cancer Neg Hx     Social History   Socioeconomic History   Marital status: Married    Spouse name: Not on file   Number of children: 0   Years of education: Not on file   Highest education level: Not on file  Occupational History   Occupation: Audiological scientist: [employer]    Comment: [employer]  Tobacco Use   Smoking status: Never   Smokeless tobacco: Never  Vaping Use   Vaping Use: Never used  Substance and Sexual Activity   Alcohol use: Yes    Alcohol/week: 0.0 standard drinks of alcohol    Comment: occasionally,1 beer once month   Drug use: No   Sexual activity: Not on file  Other Topics Concern   Not on file  Social  History Narrative   Not on file   Social Determinants of Health   Financial Resource Strain: Not on file  Food Insecurity: Not on file  Transportation Needs: Not on file  Physical Activity: Not on file  Stress: Not on file  Social Connections: Not on file  Intimate Partner Violence: Not on file    Physical Exam: Vital signs in last 24 hours: '@BP'$  (!) 150/94   Pulse 90   Temp 98.9 F (37.2 C) (Skin)   Resp 12   Ht '5\' 10"'$  (1.778 m)   Wt 269 lb (122 kg)   SpO2 97%   BMI 38.60 kg/m  GEN: NAD EYE: Sclerae anicteric ENT: MMM CV: Non-tachycardic Pulm: CTA b/l GI: Soft, NT/ND NEURO:  Alert & Oriented x 3   Gerrit Heck, DO [facility]   07/21/2022 7:58 AM

## 2022-07-21 NOTE — Patient Instructions (Signed)
Resume previous diet and medications. Awaiting pathology results. Repeat Colonoscopy date to be determined based on pathology results. Handouts provided on Colon polyps and Diverticulosis  YOU HAD AN ENDOSCOPIC PROCEDURE TODAY AT THE [facility]:   Refer to the procedure report that was given to you for any specific questions about what was found during the examination.  If the procedure report does not answer your questions, please call your gastroenterologist to clarify.  If you requested that your care partner not be given the details of your procedure findings, then the procedure report has been included in a sealed envelope for you to review at your convenience later.  YOU SHOULD EXPECT: Some feelings of bloating in the abdomen. Passage of more gas than usual.  Walking can help get rid of the air that was put into your GI tract during the procedure and reduce the bloating. If you had a lower endoscopy (such as a colonoscopy or flexible sigmoidoscopy) you may notice spotting of blood in your stool or on the toilet paper. If you underwent a bowel prep for your procedure, you may not have a normal bowel movement for a few days.  Please Note:  You might notice some irritation and congestion in your nose or some drainage.  This is from the oxygen used during your procedure.  There is no need for concern and it should clear up in a day or so.  SYMPTOMS TO REPORT IMMEDIATELY:  Following lower endoscopy (colonoscopy or flexible sigmoidoscopy):  Excessive amounts of blood in the stool  Significant tenderness or worsening of abdominal pains  Swelling of the abdomen that is new, acute  Fever of 100F or higher  For urgent or emergent issues, a gastroenterologist can be reached at any hour by calling [phone]. Do not use MyChart messaging for urgent concerns.    DIET:  We do recommend a small meal at first, but then you may proceed to your regular diet.  Drink plenty of fluids but  you should avoid alcoholic beverages for 24 hours.  ACTIVITY:  You should plan to take it easy for the rest of today and you should NOT DRIVE or use heavy machinery until tomorrow (because of the sedation medicines used during the test).    FOLLOW UP: Our staff will call the number listed on your records the next business day following your procedure.  We will call around 7:15- 8:00 am to check on you and address any questions or concerns that you may have regarding the information given to you following your procedure. If we do not reach you, we will leave a message.     If any biopsies were taken you will be contacted by phone or by letter within the next 1-3 weeks.  Please call us at [phone] if you have not heard about the biopsies in 3 weeks.    SIGNATURES/CONFIDENTIALITY: You and/or your care partner have signed paperwork which will be entered into your electronic medical record.  These signatures attest to the fact that that the information above on your After Visit Summary has been reviewed and is understood.  Full responsibility of the confidentiality of this discharge information lies with you and/or your care-partner. 

## 2022-07-21 NOTE — Progress Notes (Signed)
Called to room to assist during endoscopic procedure.  Patient ID and intended procedure confirmed with present staff. Received instructions for my participation in the procedure from the performing physician.  

## 2022-07-21 NOTE — Addendum Note (Signed)
Addended by: Loura Pardon A on: 07/21/2022 08:49 PM   Modules accepted: Orders

## 2022-07-21 NOTE — Op Note (Signed)
[facility] Patient Name: Jeffrey Hartman Procedure Date: 07/21/2022 7:53 AM MRN: [id number] Endoscopist: Gerrit Heck , MD, [id number] Age: 61 Referring MD:  Date of Birth: 1960/09/06 Gender: Male Account #: [id number] Procedure:                Colonoscopy w/ snare polypectomy Indications:              Screening in patient at increased risk: Colorectal                            cancer in mother before age 3                           Surveillance: Personal history of adenomatous                            polyps on last colonoscopy 5 years ago                           Last colonoscopy was 07/21/2017 and notable for 3                            mm polyp in the ascending colon 4 mm transverse                            polyp, 3 mm descending polyp (path tubular adenoma                            x2, HP x1). Descending diverticulosis, internal                            hemorrhoids with stigmata of recent banding.                            Recommended repeat in 5 years.                           Prior to that, colonoscopy 07/23/2012 with 3 mm                            ascending polyp, 3 mm splenic flexure polyp with                            recommendation to repeat in 5 years.                           Underwent hemorrhoid banding in 2019 with Dr.                            Havery Moros. Medicines:                Monitored Anesthesia Care Procedure:                Pre-Anesthesia Assessment:                           -  Prior to the procedure, a History and Physical                            was performed, and patient medications and                            allergies were reviewed. The patient's tolerance of                            previous anesthesia was also reviewed. The risks                            and benefits of the procedure and the sedation                            options and risks were discussed with the patient.                            All  questions were answered, and informed consent                            was obtained. Prior Anticoagulants: The patient has                            taken no anticoagulant or antiplatelet agents. ASA                            Grade Assessment: II - A patient with mild systemic                            disease. After reviewing the risks and benefits,                            the patient was deemed in satisfactory condition to                            undergo the procedure.                           After obtaining informed consent, the colonoscope                            was passed under direct vision. Throughout the                            procedure, the patient's blood pressure, pulse, and                            oxygen saturations were monitored continuously. The                            CF [id number] #[id number] was introduced through the anus  and advanced to the the terminal ileum. The                            colonoscopy was performed without difficulty. The                            patient tolerated the procedure well. The quality                            of the bowel preparation was good. The terminal                            ileum, ileocecal valve, appendiceal orifice, and                            rectum were photographed. Scope In: 8:07:16 AM Scope Out: 8:25:13 AM Scope Withdrawal Time: 0 hours 15 minutes 1 second  Total Procedure Duration: 0 hours 17 minutes 57 seconds  Findings:                 Skin tags were found on perianal exam.                           Two sessile polyps were found in the ascending                            colon. The polyps were 3 to 4 mm in size. These                            polyps were removed with a cold snare. Resection                            and retrieval were complete. Estimated blood loss                            was minimal.                           A 2 mm polyp was found in the  sigmoid colon. The                            polyp was sessile. The polyp was removed with a                            cold snare. Resection and retrieval were complete.                            Estimated blood loss was minimal.                           A few small-mouthed diverticula were found in the                            ascending colon.  A scar was found in the rectum consistent with                            prior hemorrhoid banding. The scar tissue was                            healthy in appearance.                           The terminal ileum appeared normal. Complications:            No immediate complications. Estimated Blood Loss:     Estimated blood loss was minimal. Impression:               - Perianal skin tags found on perianal exam.                           - Two 3 to 4 mm polyps in the ascending colon,                            removed with a cold snare. Resected and retrieved.                           - One 2 mm polyp in the sigmoid colon, removed with                            a cold snare. Resected and retrieved.                           - Diverticulosis in the ascending colon.                           - Scar in the rectum.                           - The examined portion of the ileum was normal. Recommendation:           - Patient has a contact number available for                            emergencies. The signs and symptoms of potential                            delayed complications were discussed with the                            patient. Return to normal activities tomorrow.                            Written discharge instructions were provided to the                            patient.                           -  Resume previous diet.                           - Continue present medications.                           - Await pathology results.                           - Repeat colonoscopy for surveillance based  on                            pathology results.                           - Return to GI office PRN. Gerrit Heck, MD 07/21/2022 8:32:53 AM

## 2022-07-22 ENCOUNTER — Telehealth: Payer: Self-pay | Admitting: *Deleted

## 2022-07-22 NOTE — Telephone Encounter (Signed)
Attempted to call patient for their post-procedure follow-up call. No answer. Left voicemail.   

## 2022-08-01 ENCOUNTER — Encounter: Payer: Managed Care, Other (non HMO) | Admitting: Gastroenterology

## 2022-08-04 ENCOUNTER — Encounter: Payer: Self-pay | Admitting: Gastroenterology

## 2022-08-27 ENCOUNTER — Telehealth: Payer: Managed Care, Other (non HMO) | Admitting: Physician Assistant

## 2022-08-27 DIAGNOSIS — R6889 Other general symptoms and signs: Secondary | ICD-10-CM

## 2022-08-27 DIAGNOSIS — J208 Acute bronchitis due to other specified organisms: Secondary | ICD-10-CM

## 2022-08-27 MED ORDER — ALBUTEROL SULFATE HFA 108 (90 BASE) MCG/ACT IN AERS: 2.0000 | INHALATION_SPRAY | Freq: Four times a day (QID) | RESPIRATORY_TRACT | 0 refills | Status: AC | PRN

## 2022-08-27 MED ORDER — BENZONATATE 100 MG PO CAPS: 100.0000 mg | ORAL_CAPSULE | Freq: Three times a day (TID) | ORAL | 0 refills | Status: DC | PRN

## 2022-08-27 MED ORDER — OSELTAMIVIR PHOSPHATE 75 MG PO CAPS: 75.0000 mg | ORAL_CAPSULE | Freq: Two times a day (BID) | ORAL | 0 refills | Status: DC

## 2022-08-27 MED ORDER — PREDNISONE 20 MG PO TABS: 40.0000 mg | ORAL_TABLET | Freq: Every day | ORAL | 0 refills | Status: DC

## 2022-08-27 NOTE — Progress Notes (Signed)
Virtual Visit Consent   Jeffrey Hartman, you are scheduled for a virtual visit with a [facility] provider today. Just as with appointments in the office, your consent must be obtained to participate. Your consent will be active for this visit and any virtual visit you may have with one of our providers in the next 365 days. If you have a MyChart account, a copy of this consent can be sent to you electronically.  As this is a virtual visit, video technology does not allow for your provider to perform a traditional examination. This may limit your provider's ability to fully assess your condition. If your provider identifies any concerns that need to be evaluated in person or the need to arrange testing (such as labs, EKG, etc.), we will make arrangements to do so. Although advances in technology are sophisticated, we cannot ensure that it will always work on either your end or our end. If the connection with a video visit is poor, the visit may have to be switched to a telephone visit. With either a video or telephone visit, we are not always able to ensure that we have a secure connection.  By engaging in this virtual visit, you consent to the provision of healthcare and authorize for your insurance to be billed (if applicable) for the services provided during this visit. Depending on your insurance coverage, you may receive a charge related to this service.  I need to obtain your verbal consent now. Are you willing to proceed with your visit today? Jeffrey Hartman has provided verbal consent on 08/27/2022 for a virtual visit (video or telephone). Leeanne Rio, [place]  Date: 08/27/2022 5:51 PM  Virtual Visit via Video Note   I, Leeanne Rio, connected with  Jeffrey Hartman  ([id number], 03/30/1961) on 08/27/22 at  5:30 PM EST by a video-enabled telemedicine application and verified that I am speaking with the correct person using two identifiers.  Location: Patient: Virtual Visit  Location Patient: Home Provider: Virtual Visit Location Provider: Home Office   I discussed the limitations of evaluation and management by telemedicine and the availability of in person appointments. The patient expressed understanding and agreed to proceed.    History of Present Illness: Jeffrey Hartman is a 61 y.o. who identifies as a male who was assigned male at birth, and is being seen today for URI symptoms starting Sunday PM with abrupt onset of fatigue, fever, aches, chills, sinus congestion and rhinorrhea. Notes fever is not intermittent but continues to have other symptoms. Now with wheezing and some SOB with exertion. Took home COVID test which was negative. Wife starting with symptoms Monday evening.  HPI: HPI  Problems:  Patient Active Problem List   Diagnosis Date Noted   Family history of colon cancer 07/21/2022   Insomnia 04/17/2021   Neck pain 04/17/2021   Prediabetes 04/10/2020   Eczema 08/24/2015   Cerumen impaction 06/16/2014   Low testosterone 11/09/2013   Prostate cancer screening 11/02/2013   Adenomatous colon polyp 07/28/2012   Family history of malignant neoplasm of gastrointestinal tract 06/29/2012   Anal fissure 06/29/2012   Personal history of colonic polyps 06/29/2012   Routine general medical examination at a health care facility 09/17/2011   Morbid obesity ([place]) 01/01/2010   Allergic rhinitis 10/13/2008   Obstructive sleep apnea 08/11/2007   Obsessive-compulsive disorder 01/21/2007   ACTINIC SKIN DAMAGE 01/21/2007   Hyperlipidemia 01/13/2007   Essential hypertension 01/13/2007    Allergies:  Allergies  Allergen  Reactions   Shellfish Allergy Shortness Of Breath and Other (See Comments)    Reaction=tunnel vision   Amlodipine Besy-Benazepril Hcl     REACTION: Generic med did not adequatly control htnh   Sertraline Hcl Nausea Only    REACTION: nausea, ED   Medications:  Current Outpatient Medications:    albuterol (VENTOLIN HFA) 108 (90 Base)  MCG/ACT inhaler, Inhale 2 puffs into the lungs every 6 (six) hours as needed for wheezing or shortness of breath., Disp: 8 g, Rfl: 0   benzonatate (TESSALON) 100 MG capsule, Take 1 capsule (100 mg total) by mouth 3 (three) times daily as needed for cough., Disp: 30 capsule, Rfl: 0   oseltamivir (TAMIFLU) 75 MG capsule, Take 1 capsule (75 mg total) by mouth 2 (two) times daily., Disp: 10 capsule, Rfl: 0   predniSONE (DELTASONE) 20 MG tablet, Take 2 tablets (40 mg total) by mouth daily with breakfast., Disp: 10 tablet, Rfl: 0   aspirin 81 MG tablet, Take 81 mg by mouth daily.  , Disp: , Rfl:    Azelastine HCl (ASTEPRO) 0.15 % SOLN, Place 1 spray into both nostrils 2 (two) times daily. (Patient not taking: Reported on 07/21/2022), Disp: 90 mL, Rfl: 3   Docusate Sodium 100 MG capsule, Take 100 mg by mouth daily., Disp: , Rfl:    escitalopram (LEXAPRO) 20 MG tablet, Take 1 tablet (20 mg total) by mouth daily., Disp: 90 tablet, Rfl: 3   Fexofenadine HCl (ALLEGRA PO), Take 1 tablet by mouth daily. As directed, Disp: , Rfl:    fish oil-omega-3 fatty acids 1000 MG capsule, Take 1 g by mouth daily. , Disp: , Rfl:    fluticasone (FLONASE) 50 MCG/ACT nasal spray, Place 2 sprays into both nostrils daily as needed., Disp: 48 g, Rfl: 3   folic acid (FOLVITE) 921 MCG tablet, Take 400 mcg by mouth daily., Disp: , Rfl:    gabapentin (NEURONTIN) 300 MG capsule, Take 1 capsule (300 mg total) by mouth at bedtime. (Patient not taking: Reported on 06/26/2022), Disp: 90 capsule, Rfl: 1   glucosamine-chondroitin 500-400 MG tablet, Take 1 tablet by mouth 3 (three) times daily., Disp: , Rfl:    hydrocortisone (ANUSOL-HC) 25 MG suppository, Place 1 suppository (25 mg total) rectally every evening. As needed for 7 days as needed for rectal pain or bleeding (Patient not taking: Reported on 06/26/2022), Disp: 14 suppository, Rfl: 2   LOTREL 5-10 MG capsule, TAKE 1 CAPSULE BY MOUTH EVERY DAY, Disp: 90 capsule, Rfl: 1   Melatonin 5  MG TABS, Take 10 mg by mouth at bedtime. (Patient not taking: Reported on 06/26/2022), Disp: , Rfl:    methocarbamol (ROBAXIN) 500 MG tablet, Take 1 tablet (500 mg total) by mouth every 8 (eight) hours as needed for muscle spasms. (Patient not taking: Reported on 06/26/2022), Disp: 90 tablet, Rfl: 2   Multiple Vitamin (MULTIVITAMIN) capsule, Take 1 capsule by mouth daily., Disp: , Rfl:    Semaglutide-Weight Management 1.7 MG/0.75ML SOAJ, Inject 1.7 mg into the skin once a week., Disp: 3 mL, Rfl: 3   Semaglutide-Weight Management 2.4 MG/0.75ML SOAJ, Inject 2.4 mg into the skin once a week., Disp: 3 mL, Rfl: 3   simvastatin (ZOCOR) 20 MG tablet, Take 1 tablet (20 mg total) by mouth daily., Disp: 90 tablet, Rfl: 3   triamcinolone cream (KENALOG) 0.5 %, Apply 1 application topically 2 (two) times daily. To affected area (Patient not taking: Reported on 06/26/2022), Disp: 30 g, Rfl: 0   Turmeric (  QC TUMERIC COMPLEX) 500 MG CAPS, Take by mouth., Disp: , Rfl:   Observations/Objective: Patient is well-developed, well-nourished in no acute distress.  Resting comfortably at home.  Head is normocephalic, atraumatic.  No labored breathing. Speech is clear and coherent with logical content.  Patient is alert and oriented at baseline.   Assessment and Plan: 1. Flu-like symptoms - albuterol (VENTOLIN HFA) 108 (90 Base) MCG/ACT inhaler; Inhale 2 puffs into the lungs every 6 (six) hours as needed for wheezing or shortness of breath.  Dispense: 8 g; Refill: 0 - benzonatate (TESSALON) 100 MG capsule; Take 1 capsule (100 mg total) by mouth 3 (three) times daily as needed for cough.  Dispense: 30 capsule; Refill: 0 - oseltamivir (TAMIFLU) 75 MG capsule; Take 1 capsule (75 mg total) by mouth 2 (two) times daily.  Dispense: 10 capsule; Refill: 0  2. Viral bronchitis - albuterol (VENTOLIN HFA) 108 (90 Base) MCG/ACT inhaler; Inhale 2 puffs into the lungs every 6 (six) hours as needed for wheezing or shortness of  breath.  Dispense: 8 g; Refill: 0 - predniSONE (DELTASONE) 20 MG tablet; Take 2 tablets (40 mg total) by mouth daily with breakfast.  Dispense: 10 tablet; Refill: 0  Concern for influenza and developing viral bronchitis. COVID negative. Supportive measures, OTC medications reviewed. Will begin Tamiflu as he is right at end of antiviral window. Tessalon and albuterol per orders. Giving substantial wheeze, if not improving with albuterol, will have him start prednisone, taking as directed.   Follow Up Instructions: I discussed the assessment and treatment plan with the patient. The patient was provided an opportunity to ask questions and all were answered. The patient agreed with the plan and demonstrated an understanding of the instructions.  A copy of instructions were sent to the patient via MyChart unless otherwise noted below.   The patient was advised to call back or seek an in-person evaluation if the symptoms worsen or if the condition fails to improve as anticipated.  Time:  I spent 10 minutes with the patient via telehealth technology discussing the above problems/concerns.    Leeanne Rio, PA-C

## 2022-08-27 NOTE — Patient Instructions (Signed)
Jeffrey Hartman, thank you for joining Leeanne Rio, PA-C for today's virtual visit.  While this provider is not your primary care provider (PCP), if your PCP is located in our provider database this encounter information will be shared with them immediately following your visit.   [facility] account gives you access to today's visit and all your visits, tests, and labs performed at [facility] " click here if you don't have a [facility] account or go to [email or web address]  Consent: (Patient) Jeffrey Hartman provided verbal consent for this virtual visit at the beginning of the encounter.  Current Medications:  Current Outpatient Medications:    albuterol (VENTOLIN HFA) 108 (90 Base) MCG/ACT inhaler, Inhale 2 puffs into the lungs every 6 (six) hours as needed for wheezing or shortness of breath., Disp: 8 g, Rfl: 0   benzonatate (TESSALON) 100 MG capsule, Take 1 capsule (100 mg total) by mouth 3 (three) times daily as needed for cough., Disp: 30 capsule, Rfl: 0   oseltamivir (TAMIFLU) 75 MG capsule, Take 1 capsule (75 mg total) by mouth 2 (two) times daily., Disp: 10 capsule, Rfl: 0   predniSONE (DELTASONE) 20 MG tablet, Take 2 tablets (40 mg total) by mouth daily with breakfast., Disp: 10 tablet, Rfl: 0   aspirin 81 MG tablet, Take 81 mg by mouth daily.  , Disp: , Rfl:    Azelastine HCl (ASTEPRO) 0.15 % SOLN, Place 1 spray into both nostrils 2 (two) times daily. (Patient not taking: Reported on 07/21/2022), Disp: 90 mL, Rfl: 3   Docusate Sodium 100 MG capsule, Take 100 mg by mouth daily., Disp: , Rfl:    escitalopram (LEXAPRO) 20 MG tablet, Take 1 tablet (20 mg total) by mouth daily., Disp: 90 tablet, Rfl: 3   Fexofenadine HCl (ALLEGRA PO), Take 1 tablet by mouth daily. As directed, Disp: , Rfl:    fish oil-omega-3 fatty acids 1000 MG capsule, Take 1 g by mouth daily. , Disp: , Rfl:    fluticasone (FLONASE) 50 MCG/ACT nasal spray, Place 2 sprays  into both nostrils daily as needed., Disp: 48 g, Rfl: 3   folic acid (FOLVITE) 413 MCG tablet, Take 400 mcg by mouth daily., Disp: , Rfl:    gabapentin (NEURONTIN) 300 MG capsule, Take 1 capsule (300 mg total) by mouth at bedtime. (Patient not taking: Reported on 06/26/2022), Disp: 90 capsule, Rfl: 1   glucosamine-chondroitin 500-400 MG tablet, Take 1 tablet by mouth 3 (three) times daily., Disp: , Rfl:    hydrocortisone (ANUSOL-HC) 25 MG suppository, Place 1 suppository (25 mg total) rectally every evening. As needed for 7 days as needed for rectal pain or bleeding (Patient not taking: Reported on 06/26/2022), Disp: 14 suppository, Rfl: 2   LOTREL 5-10 MG capsule, TAKE 1 CAPSULE BY MOUTH EVERY DAY, Disp: 90 capsule, Rfl: 1   Melatonin 5 MG TABS, Take 10 mg by mouth at bedtime. (Patient not taking: Reported on 06/26/2022), Disp: , Rfl:    methocarbamol (ROBAXIN) 500 MG tablet, Take 1 tablet (500 mg total) by mouth every 8 (eight) hours as needed for muscle spasms. (Patient not taking: Reported on 06/26/2022), Disp: 90 tablet, Rfl: 2   Multiple Vitamin (MULTIVITAMIN) capsule, Take 1 capsule by mouth daily., Disp: , Rfl:    Semaglutide-Weight Management 1.7 MG/0.75ML SOAJ, Inject 1.7 mg into the skin once a week., Disp: 3 mL, Rfl: 3   Semaglutide-Weight Management 2.4 MG/0.75ML SOAJ, Inject 2.4 mg into the skin once a  week., Disp: 3 mL, Rfl: 3   simvastatin (ZOCOR) 20 MG tablet, Take 1 tablet (20 mg total) by mouth daily., Disp: 90 tablet, Rfl: 3   triamcinolone cream (KENALOG) 0.5 %, Apply 1 application topically 2 (two) times daily. To affected area (Patient not taking: Reported on 06/26/2022), Disp: 30 g, Rfl: 0   Turmeric (QC TUMERIC COMPLEX) 500 MG CAPS, Take by mouth., Disp: , Rfl:    Medications ordered in this encounter:  Meds ordered this encounter  Medications   albuterol (VENTOLIN HFA) 108 (90 Base) MCG/ACT inhaler    Sig: Inhale 2 puffs into the lungs every 6 (six) hours as needed for  wheezing or shortness of breath.    Dispense:  8 g    Refill:  0    Order Specific Question:   Supervising Provider    Answer:   Chase Picket [[id number]]   predniSONE (DELTASONE) 20 MG tablet    Sig: Take 2 tablets (40 mg total) by mouth daily with breakfast.    Dispense:  10 tablet    Refill:  0    Order Specific Question:   Supervising Provider    Answer:   Chase Picket [[id number]]   benzonatate (TESSALON) 100 MG capsule    Sig: Take 1 capsule (100 mg total) by mouth 3 (three) times daily as needed for cough.    Dispense:  30 capsule    Refill:  0    Order Specific Question:   Supervising Provider    Answer:   Chase Picket [id number]   oseltamivir (TAMIFLU) 75 MG capsule    Sig: Take 1 capsule (75 mg total) by mouth 2 (two) times daily.    Dispense:  10 capsule    Refill:  0    Order Specific Question:   Supervising Provider    Answer:   Chase Picket [id number]     *If you need refills on other medications prior to your next appointment, please contact your pharmacy*  Follow-Up: Call back or seek an in-person evaluation if the symptoms worsen or if the condition fails to improve as anticipated.  [place] [phone]  Other Instructions Please keep well-hydrated and try to get plenty of rest. If you have a humidifier, place it in the bedroom and run it at night. Start a saline nasal rinse for nasal congestion. You can consider use of a nasal steroid spray like Flonase or Nasacort OTC. You can alternate between Tylenol and Ibuprofen if needed for fever, body aches, headache and/or throat pain. Salt water-gargles and chloraseptic spray can be very beneficial for sore throat. Mucinex-DM for congestion or cough. Please take all prescribed medications as directed.  Remain out of work until [employer] for 24 hours without a fever-reducing medication, and you are feeling better.  You should mask until symptoms are resolved.  If anything worsens  despite treatment, you need to be evaluated in-person. Please do not delay care.  Influenza, Adult Influenza is also called "the flu." It is an infection in the lungs, nose, and throat (respiratory tract). It spreads easily from person to person (is contagious). The flu causes symptoms that are like a cold, along with high fever and body aches. What are the causes? This condition is caused by the influenza virus. You can get the virus by: Breathing in droplets that are in the air after a person infected with the flu coughed or sneezed. Touching something that has the virus on it and then  touching your mouth, nose, or eyes. What increases the risk? Certain things may make you more likely to get the flu. These include: Not washing your hands often. Having close contact with many people during cold and flu season. Touching your mouth, eyes, or nose without first washing your hands. Not getting a flu shot every year. You may have a higher risk for the flu, and serious problems, such as a lung infection (pneumonia), if you: Are older than 65. Are pregnant. Have a weakened disease-fighting system (immune system) because of a disease or because you are taking certain medicines. Have a long-term (chronic) condition, such as: Heart, kidney, or lung disease. Diabetes. Asthma. Have a liver disorder. Are very overweight (morbidly obese). Have anemia. What are the signs or symptoms? Symptoms usually begin suddenly and last 4-14 days. They may include: Fever and chills. Headaches, body aches, or muscle aches. Sore throat. Cough. Runny or stuffy (congested) nose. Feeling discomfort in your chest. Not wanting to eat as much as normal. Feeling weak or tired. Feeling dizzy. Feeling sick to your stomach or throwing up. How is this treated? If the flu is found early, you can be treated with antiviral medicine. This can help to reduce how bad the illness is and how long it lasts. This may be given  by mouth or through an IV tube. Taking care of yourself at home can help your symptoms get better. Your doctor may want you to: Take over-the-counter medicines. Drink plenty of fluids. The flu often goes away on its own. If you have very bad symptoms or other problems, you may be treated in a hospital. Follow these instructions at home:     Activity Rest as needed. Get plenty of sleep. Stay home from work or school as told by your doctor. Do not leave home until you do not have a fever for 24 hours without taking medicine. Leave home only to go to your doctor. Eating and drinking Take an ORS (oral rehydration solution). This is a drink that is sold at pharmacies and stores. Drink enough fluid to keep your pee pale yellow. Drink clear fluids in small amounts as you are able. Clear fluids include: Water. Ice chips. Fruit juice mixed with water. Low-calorie sports drinks. Eat bland foods that are easy to digest. Eat small amounts as you are able. These foods include: Bananas. Applesauce. Rice. Lean meats. Toast. Crackers. Do not eat or drink: Fluids that have a lot of sugar or caffeine. Alcohol. Spicy or fatty foods. General instructions Take over-the-counter and prescription medicines only as told by your doctor. Use a cool mist humidifier to add moisture to the air in your home. This can make it easier for you to breathe. When using a cool mist humidifier, clean it daily. Empty water and replace with clean water. Cover your mouth and nose when you cough or sneeze. Wash your hands with soap and water often and for at least 20 seconds. This is also important after you cough or sneeze. If you cannot use soap and water, use alcohol-based hand sanitizer. Keep all follow-up visits. How is this prevented?  Get a flu shot every year. You may get the flu shot in late summer, fall, or winter. Ask your doctor when you should get your flu shot. Avoid contact with people who are sick  during fall and winter. This is cold and flu season. Contact a doctor if: You get new symptoms. You have: Chest pain. Watery poop (diarrhea). A fever. Your cough  gets worse. You start to have more mucus. You feel sick to your stomach. You throw up. Get help right away if you: Have shortness of breath. Have trouble breathing. Have skin or nails that turn a bluish color. Have very bad pain or stiffness in your neck. Get a sudden headache. Get sudden pain in your face or ear. Cannot eat or drink without throwing up. These symptoms may represent a serious problem that is an emergency. Get medical help right away. Call your local emergency services (911 in the [place] Do not wait to see if the symptoms will go away. Do not drive yourself to the hospital. Summary Influenza is also called "the flu." It is an infection in the lungs, nose, and throat. It spreads easily from person to person. Take over-the-counter and prescription medicines only as told by your doctor. Getting a flu shot every year is the best way to not get the flu. This information is not intended to replace advice given to you by your health care provider. Make sure you discuss any questions you have with your health care provider. Document Revised: 04/06/2020 Document Reviewed: 04/06/2020 Elsevier Patient Education  [place].      If you have been instructed to have an in-person evaluation today at a local Urgent Care facility, please use the link below. It will take you to a list of all of our available [facility], including address, phone number and hours of operation. Please do not delay care.  [facility]  If you or a family member do not have a primary care provider, use the link below to schedule a visit and establish care. When you choose a [facility] primary care physician or advanced practice provider, you gain a long-term partner in health. Find a Primary Care  Provider  Learn more about [facility]'s in-office and virtual care options: [facility]

## 2022-09-09 ENCOUNTER — Ambulatory Visit (HOSPITAL_BASED_OUTPATIENT_CLINIC_OR_DEPARTMENT_OTHER): Payer: Managed Care, Other (non HMO) | Admitting: Pulmonary Disease

## 2022-09-13 ENCOUNTER — Encounter: Payer: Self-pay | Admitting: Family Medicine

## 2022-09-15 MED ORDER — TIRZEPATIDE 7.5 MG/0.5ML ~~LOC~~ SOAJ: 7.5000 mg | SUBCUTANEOUS | 0 refills | Status: DC

## 2022-09-26 ENCOUNTER — Telehealth: Payer: Self-pay | Admitting: Family Medicine

## 2022-09-26 NOTE — Telephone Encounter (Signed)
Patient called and stated he gets lightheaded and dizziness when he stands up. Patient was sent to access nurse.

## 2022-09-26 NOTE — Telephone Encounter (Signed)
Thanks for attempting triage

## 2022-09-26 NOTE — Telephone Encounter (Signed)
[facility] - Client TELEPHONE ADVICE RECORD [facility] Patient Name: Jeffrey Hartman Gender: Male DOB: 05-12-1961 Age: 62 Y 85 M 7 D Return Phone Number: [phone] (Primary) Address: City/ State/ Zip: [place]  [address] Client [facility] - Client Client Site [facility] Provider Glori Bickers, Roque Lias - MD Contact Type Call Who Is Calling Patient / Member / Family / Caregiver Call Type Triage / Clinical Relationship To Patient Self Return Phone Number [phone] (Primary) Chief Complaint Dizziness Reason for Call Symptomatic / Request for Health Information Initial Comment Caller he is lightheaded and dizzy. Translation No Disp. Time Eilene Ghazi Time) Disposition Final User 09/26/2022 1:18:27 PM Attempt made - message left Rebecca Eaton 09/26/2022 1:29:18 PM Attempt made - message left Rebecca Eaton 09/26/2022 1:41:14 PM FINAL ATTEMPT MADE - message left Yes Glean Salvo RN, Magda Paganini Final Disposition 09/26/2022 1:41:14 PM FINAL ATTEMPT MADE - message left Yes Glean Salvo, RN,

## 2022-09-26 NOTE — Telephone Encounter (Signed)
Agree with advisement  Will see him then

## 2022-09-26 NOTE — Telephone Encounter (Signed)
I left v/m requesting pt to call [facility] [phone] for triage. Sending note to [facility] triage; Dr Glori Bickers and [employer].

## 2022-09-26 NOTE — Telephone Encounter (Addendum)
I spoke with pt; pt said he had had issue with lightheadedness once a month for years when he stands from seated position. Pt said since had flu in Dec 2023 episodes are happening more often the last 2 wks. Pt said after seated position for a while when stands is lightheaded, room does not spin around. No N&V and no CP or SOB. No H/A. Pt said he took his BP x 2 today. 139/82 P93 and recked 122/89 P 93. Pt is working at desk now and feels fine. Pt said he does not need to go to UC and wants appt with Dr Glori Bickers. Pt scheduled appt with Dr Glori Bickers 09/29/22 at 12 noon with UC & ED precautions. Advised pt when standing to stand slowly and not to rush. Pt will also drink to stay hydrated.Pt voiced understanding. Sending note to Dr Glori Bickers and [employer].

## 2022-09-29 ENCOUNTER — Encounter: Payer: Self-pay | Admitting: Family Medicine

## 2022-09-29 ENCOUNTER — Ambulatory Visit (INDEPENDENT_AMBULATORY_CARE_PROVIDER_SITE_OTHER): Payer: Managed Care, Other (non HMO) | Admitting: Family Medicine

## 2022-09-29 VITALS — BP 128/78 | HR 52 | Temp 97.7°F | Ht 70.0 in | Wt 262.2 lb

## 2022-09-29 DIAGNOSIS — Z6837 Body mass index (BMI) 37.0-37.9, adult: Secondary | ICD-10-CM | POA: Diagnosis not present

## 2022-09-29 DIAGNOSIS — R42 Dizziness and giddiness: Secondary | ICD-10-CM | POA: Insufficient documentation

## 2022-09-29 DIAGNOSIS — I10 Essential (primary) hypertension: Secondary | ICD-10-CM

## 2022-09-29 NOTE — Patient Instructions (Signed)
Your exam and vitals are re assuring today   Stay hydrated !  At least 60 oz of fluid (water) daily  Stand up slowly (from sitting or lying) - always take your time   Keep working on healthy diet and exercise for weight loss   If your bp goes down below 100/60 let me know    Let's see how you do on mounjaro - let me know how you feel with it    When you eat your small meals-focus on protein and produce   If symptoms worsen or do not improve let us know

## 2022-09-29 NOTE — Assessment & Plan Note (Addendum)
This is postural and tends to happen 2-3 times per week , usually when he stands from prolonged sitting Is brief, no syncope  No palpitations or cp or sob  Reassuring exam  Neg orthostatics today but is feasible that ortho changes in bp on the lotrel could cause (in setting of baseline lower pulse and lower weight recently)  Inst to stand /get up more slowly  Stay hydrated Monitor bp at home and consider cutting back on med if it gets lower  Watch for any new neuro s/s Watch for s/s of vertigo  Update if not starting to improve in a week or if worsening

## 2022-09-29 NOTE — Progress Notes (Signed)
Subjective:    Patient ID: Jeffrey Hartman, male    DOB: [birth year], 62 y.o.   MRN: [id number]  HPI Pt presents with c/o light headedness   (acute on chronic)   Wt Readings from Last 3 Encounters:  09/29/22 262 lb 4 oz (119 kg)  07/21/22 269 lb (122 kg)  04/29/22 285 lb 4 oz (129.4 kg)   37.63 kg/m  Had the flu in December This used to happen about once per mo Since then more often (2-3 times per week)   Last episode Friday   When he stands up Feels light headed/ a pressure sensation in head  Has not fainted Takes a deep breath and it is gone in 30-60 seconds usually  Makes him feel a bit weak in arms and legs for just a seconds   ? Orthostatic hypotension    No cp or palpitations No nausea /vomiting No sob    HTN bp is stable today  No cp or palpitations or headaches or edema  No side effects to medicines  BP Readings from Last 3 Encounters:  09/29/22 128/78  07/21/22 121/82  04/29/22 126/80     Daw lotrel 5-10 mg daily   Pulse Readings from Last 3 Encounters:  09/29/22 (!) 52  07/21/22 77  04/29/22 74   Pulse is slower than usual today   No change with standing   Bp is 125/72 lying and then 128/75 sitting  Had RSV, flu and covid shots in oct   Patient Active Problem List   Diagnosis Date Noted   Light headedness 09/29/2022   Family history of colon cancer 07/21/2022   Insomnia 04/17/2021   Neck pain 04/17/2021   Prediabetes 04/10/2020   Eczema 08/24/2015   Cerumen impaction 06/16/2014   Low testosterone 11/09/2013   Prostate cancer screening 11/02/2013   Adenomatous colon polyp 07/28/2012   Family history of malignant neoplasm of gastrointestinal tract 06/29/2012   Anal fissure 06/29/2012   Personal history of colonic polyps 06/29/2012   Routine general medical examination at a health care facility 09/17/2011   Morbid obesity ([place]) 01/01/2010   Allergic rhinitis 10/13/2008   Obstructive sleep apnea 08/11/2007   Obsessive-compulsive  disorder 01/21/2007   ACTINIC SKIN DAMAGE 01/21/2007   Hyperlipidemia 01/13/2007   Essential hypertension 01/13/2007   Past Medical History:  Diagnosis Date   Anxiety states    Dermatophytosis of nail    Obsessive-compulsive disorders    Obstructive sleep apnea (adult) (pediatric)    Other and unspecified hyperlipidemia    Seasonal allergic rhinitis    Sleep apnea    Unspecified dermatitis due to sun    Unspecified essential hypertension    Unspecified sleep apnea    Past Surgical History:  Procedure Laterality Date   COLONOSCOPY  11/02, 11/05   polyps   CYSTECTOMY     cyst on hand   Social History   Tobacco Use   Smoking status: Never   Smokeless tobacco: Never  Vaping Use   Vaping Use: Never used  Substance Use Topics   Alcohol use: Yes    Alcohol/week: 0.0 standard drinks of alcohol    Comment: occasionally,1 beer once month   Drug use: No   Family History  Problem Relation Age of Onset   Colon cancer Mother 30   Hypertension Father    Diabetes type II Father    Cancer - Other Father 70       Adrenal Cancer   Prostate cancer Neg Hx  Colon polyps Neg Hx    Esophageal cancer Neg Hx    Stomach cancer Neg Hx    Rectal cancer Neg Hx    Allergies  Allergen Reactions   Shellfish Allergy Shortness Of Breath and Other (See Comments)    Reaction=tunnel vision   Amlodipine Besy-Benazepril Hcl     REACTION: Generic med did not adequatly control htnh   Sertraline Hcl Nausea Only    REACTION: nausea, ED   Current Outpatient Medications on File Prior to Visit  Medication Sig Dispense Refill   albuterol (VENTOLIN HFA) 108 (90 Base) MCG/ACT inhaler Inhale 2 puffs into the lungs every 6 (six) hours as needed for wheezing or shortness of breath. 8 g 0   aspirin 81 MG tablet Take 81 mg by mouth daily.       Azelastine HCl (ASTEPRO) 0.15 % SOLN Place 1 spray into both nostrils 2 (two) times daily. 90 mL 3   benzonatate (TESSALON) 100 MG capsule Take 1 capsule (100 mg  total) by mouth 3 (three) times daily as needed for cough. 30 capsule 0   Docusate Sodium 100 MG capsule Take 100 mg by mouth daily.     escitalopram (LEXAPRO) 20 MG tablet Take 1 tablet (20 mg total) by mouth daily. 90 tablet 3   Fexofenadine HCl (ALLEGRA PO) Take 1 tablet by mouth daily. As directed     fish oil-omega-3 fatty acids 1000 MG capsule Take 1 g by mouth daily.      fluticasone (FLONASE) 50 MCG/ACT nasal spray Place 2 sprays into both nostrils daily as needed. 48 g 3   folic acid (FOLVITE) 998 MCG tablet Take 400 mcg by mouth daily.     gabapentin (NEURONTIN) 300 MG capsule Take 1 capsule (300 mg total) by mouth at bedtime. 90 capsule 1   glucosamine-chondroitin 500-400 MG tablet Take 1 tablet by mouth 3 (three) times daily.     hydrocortisone (ANUSOL-HC) 25 MG suppository Place 1 suppository (25 mg total) rectally every evening. As needed for 7 days as needed for rectal pain or bleeding 14 suppository 2   LOTREL 5-10 MG capsule TAKE 1 CAPSULE BY MOUTH EVERY DAY 90 capsule 1   Melatonin 5 MG TABS Take 10 mg by mouth at bedtime.     methocarbamol (ROBAXIN) 500 MG tablet Take 1 tablet (500 mg total) by mouth every 8 (eight) hours as needed for muscle spasms. 90 tablet 2   Multiple Vitamin (MULTIVITAMIN) capsule Take 1 capsule by mouth daily.     Semaglutide-Weight Management 2.4 MG/0.75ML SOAJ Inject 2.4 mg into the skin once a week. 3 mL 3   simvastatin (ZOCOR) 20 MG tablet Take 1 tablet (20 mg total) by mouth daily. 90 tablet 3   tirzepatide (MOUNJARO) 7.5 MG/0.5ML Pen Inject 7.5 mg into the skin once a week. 3 mL 0   triamcinolone cream (KENALOG) 0.5 % Apply 1 application topically 2 (two) times daily. To affected area 30 g 0   Turmeric (QC TUMERIC COMPLEX) 500 MG CAPS Take by mouth.     No current facility-administered medications on file prior to visit.    Review of Systems  Constitutional:  Negative for activity change, appetite change, fatigue, fever and unexpected weight  change.  HENT:  Negative for congestion, rhinorrhea, sore throat and trouble swallowing.   Eyes:  Negative for pain, redness, itching and visual disturbance.  Respiratory:  Negative for cough, chest tightness, shortness of breath and wheezing.   Cardiovascular:  Negative for chest  pain and palpitations.  Gastrointestinal:  Negative for abdominal pain, blood in stool, constipation, diarrhea and nausea.  Endocrine: Negative for cold intolerance, heat intolerance, polydipsia and polyuria.  Genitourinary:  Negative for difficulty urinating, dysuria, frequency and urgency.  Musculoskeletal:  Negative for arthralgias, joint swelling and myalgias.  Skin:  Negative for pallor and rash.  Neurological:  Positive for light-headedness. Negative for dizziness, tremors, seizures, syncope, facial asymmetry, speech difficulty, weakness, numbness and headaches.  Hematological:  Negative for adenopathy. Does not bruise/bleed easily.  Psychiatric/Behavioral:  Negative for decreased concentration and dysphoric mood. The patient is not nervous/anxious.        Objective:   Physical Exam Constitutional:      General: He is not in acute distress.    Appearance: Normal appearance. He is well-developed. He is obese. He is not ill-appearing or diaphoretic.  HENT:     Head: Normocephalic and atraumatic.     Right Ear: Tympanic membrane, ear canal and external ear normal. There is impacted cerumen.     Left Ear: Tympanic membrane, ear canal and external ear normal. There is impacted cerumen.     Ears:     Comments: Partial cerumen imp bilat      Nose: Nose normal.     Mouth/Throat:     Pharynx: No oropharyngeal exudate.  Eyes:     General: No scleral icterus.       Right eye: No discharge.        Left eye: No discharge.     Conjunctiva/sclera: Conjunctivae normal.     Pupils: Pupils are equal, round, and reactive to light.     Comments: No nystagmus  Neck:     Thyroid: No thyromegaly.     Vascular: No  carotid bruit or JVD.     Trachea: No tracheal deviation.  Cardiovascular:     Rate and Rhythm: Regular rhythm. Bradycardia present.     Pulses: Normal pulses.     Heart sounds: Normal heart sounds. No murmur heard. Pulmonary:     Effort: Pulmonary effort is normal. No respiratory distress.     Breath sounds: Normal breath sounds. No wheezing or rales.  Abdominal:     General: Bowel sounds are normal. There is no distension.     Palpations: Abdomen is soft. There is no mass.     Tenderness: There is no abdominal tenderness.  Musculoskeletal:        General: No tenderness.     Cervical back: Full passive range of motion without pain, normal range of motion and neck supple.     Right lower leg: No edema.     Left lower leg: No edema.  Lymphadenopathy:     Cervical: No cervical adenopathy.  Skin:    General: Skin is warm and dry.     Coloration: Skin is not pale.     Findings: No rash.  Neurological:     Mental Status: He is alert and oriented to person, place, and time.     Cranial Nerves: Cranial nerves 2-12 are intact. No cranial nerve deficit or facial asymmetry.     Sensory: Sensation is intact. No sensory deficit.     Motor: No weakness, tremor, atrophy or abnormal muscle tone.     Coordination: Coordination is intact. Romberg sign negative. Coordination normal.     Gait: Gait normal.     Deep Tendon Reflexes: Reflexes are normal and symmetric. Reflexes normal.     Comments: No focal cerebellar signs   Psychiatric:  Behavior: Behavior normal.        Thought Content: Thought content normal.           Assessment & Plan:   Problem List Items Addressed This Visit       Cardiovascular and Mediastinum   Essential hypertension    bp in fair control at this time  BP Readings from Last 1 Encounters:  09/29/22 128/78  No changes needed-has to take DAW lotrel 5-10 mg daily  In past generic does not work  Most recent labs reviewed  Disc lifstyle change with low  sodium diet and exercise  I do not think low bp is causing his intermittent light headedness but will follow closely  Enc good hydration  Enc him to continue working on weight loss        Other   Class 2 obesity due to excess calories with body mass index (BMI) of 37.0 to 37.9 in adult    Doing well with gradual wt loss with wegovy Will need to change to mounjaro soon due to ins pref Will update re: side eff and progress  Discussed how this problem influences overall health and the risks it imposes  Reviewed plan for weight loss with lower calorie diet (via better food choices and also portion control or program like weight watchers) and exercise building up to or more than 30 minutes 5 days per week including some aerobic activity        Light headedness - Primary    This is postural and tends to happen 2-3 times per week , usually when he stands from prolonged sitting Is brief, no syncope  No palpitations or cp or sob  Reassuring exam  Neg orthostatics today but is feasible that ortho changes in bp on the lotrel could cause (in setting of baseline lower pulse and lower weight recently)  Inst to stand /get up more slowly  Stay hydrated Monitor bp at home and consider cutting back on med if it gets lower  Watch for any new neuro s/s Watch for s/s of vertigo  Update if not starting to improve in a week or if worsening

## 2022-09-29 NOTE — Assessment & Plan Note (Signed)
bp in fair control at this time  BP Readings from Last 1 Encounters:  09/29/22 128/78   No changes needed-has to take DAW lotrel 5-10 mg daily  In past generic does not work  Most recent labs reviewed  Disc lifstyle change with low sodium diet and exercise  I do not think low bp is causing his intermittent light headedness but will follow closely  Enc good hydration  Enc him to continue working on weight loss

## 2022-09-29 NOTE — Assessment & Plan Note (Signed)
Doing well with gradual wt loss with wegovy Will need to change to mounjaro soon due to ins pref Will update re: side eff and progress  Discussed how this problem influences overall health and the risks it imposes  Reviewed plan for weight loss with lower calorie diet (via better food choices and also portion control or program like weight watchers) and exercise building up to or more than 30 minutes 5 days per week including some aerobic activity

## 2022-10-13 ENCOUNTER — Other Ambulatory Visit: Payer: Self-pay | Admitting: Family Medicine

## 2022-10-14 NOTE — Telephone Encounter (Signed)
Last filled on 09/15/22 # 3 mL / 0 refill, CPE scheduled for 04/21/23

## 2022-10-14 NOTE — Telephone Encounter (Signed)
Before I refill this please ask pt how he is doing with it and if any side effects Thanks

## 2022-10-16 ENCOUNTER — Ambulatory Visit (HOSPITAL_BASED_OUTPATIENT_CLINIC_OR_DEPARTMENT_OTHER): Payer: Managed Care, Other (non HMO) | Admitting: Pulmonary Disease

## 2022-10-16 ENCOUNTER — Telehealth: Payer: Managed Care, Other (non HMO) | Admitting: Family Medicine

## 2022-10-16 ENCOUNTER — Encounter: Payer: Self-pay | Admitting: Family Medicine

## 2022-10-16 VITALS — BP 128/82 | Temp 100.0°F

## 2022-10-16 DIAGNOSIS — U071 COVID-19: Secondary | ICD-10-CM | POA: Insufficient documentation

## 2022-10-16 MED ORDER — NIRMATRELVIR/RITONAVIR (PAXLOVID)TABLET: 3.0000 | ORAL_TABLET | Freq: Two times a day (BID) | ORAL | 0 refills | Status: AC

## 2022-10-16 NOTE — Telephone Encounter (Signed)
If he just started it I would wait to refill until he is closer to being out and we can see if he needs a dose adjustment

## 2022-10-16 NOTE — Patient Instructions (Addendum)
Drink fluids and rest  mucinex DM is good for cough and congestion  Nasal saline for congestion as needed  Tylenol for fever or pain or headache  Please alert [place] if symptoms worsen (if severe or short of breath please go to the ER)   Isolate until your symptoms are better (a minimum of 5 days) Then mask for an additional 10 days when you go back into public  Update if not starting to improve in a week or if worsening   Take Paxlovid as directed  Hold you simvastatin for a week  If any intolerable side effects let me know

## 2022-10-16 NOTE — Progress Notes (Signed)
Virtual Visit via Video Note  I connected with Jeffrey Hartman on 10/16/22 at  9:00 AM EST by a video enabled telemedicine application and verified that I am speaking with the correct person using two identifiers.  Location: Patient: home Provider: office    I discussed the limitations of evaluation and management by telemedicine and the availability of in person appointments. The patient expressed understanding and agreed to proceed.  Parties involved in encounter  Patient: Jeffrey Hartman   Provider:  Loura Pardon MD   History of Present Illness: Pt presents with covid 32  Wife has it also  He is immunized -had booster this fall   Runny nose Monday evening /tues am Then sore throat  Did covid test yesterday and it was pos   Runny nose  Congestion   clear mucous Headache - last few days , better now  ST  Temp - went up over 100 , keeping down with tylenol  Some cough - dry so far  No wheezing  No sob  A little dizzy now and then   No appetite  Drinking lots of water  No loss of taste or smell     Otc Mucinex D  Tylenol   Still has tessalon at home for cough     Lab Results  Component Value Date   CREATININE 1.22 04/11/2022   BUN 17 04/11/2022   NA 139 04/11/2022   K 4.4 04/11/2022   CL 103 04/11/2022   CO2 30 04/11/2022  GFR 65.02  Patient Active Problem List   Diagnosis Date Noted   COVID-19 10/16/2022   Light headedness 09/29/2022   Family history of colon cancer 07/21/2022   Insomnia 04/17/2021   Neck pain 04/17/2021   Prediabetes 04/10/2020   Eczema 08/24/2015   Cerumen impaction 06/16/2014   Low testosterone 11/09/2013   Prostate cancer screening 11/02/2013   Adenomatous colon polyp 07/28/2012   Family history of malignant neoplasm of gastrointestinal tract 06/29/2012   Anal fissure 06/29/2012   Personal history of colonic polyps 06/29/2012   Routine general medical examination at a health care facility 09/17/2011   Class 2 obesity due  to excess calories with body mass index (BMI) of 37.0 to 37.9 in adult 01/01/2010   Allergic rhinitis 10/13/2008   Obstructive sleep apnea 08/11/2007   Obsessive-compulsive disorder 01/21/2007   ACTINIC SKIN DAMAGE 01/21/2007   Hyperlipidemia 01/13/2007   Essential hypertension 01/13/2007   Past Medical History:  Diagnosis Date   Anxiety states    Dermatophytosis of nail    Obsessive-compulsive disorders    Obstructive sleep apnea (adult) (pediatric)    Other and unspecified hyperlipidemia    Seasonal allergic rhinitis    Sleep apnea    Unspecified dermatitis due to sun    Unspecified essential hypertension    Unspecified sleep apnea    Past Surgical History:  Procedure Laterality Date   COLONOSCOPY  11/02, 11/05   polyps   CYSTECTOMY     cyst on hand   Social History   Tobacco Use   Smoking status: Never   Smokeless tobacco: Never  Vaping Use   Vaping Use: Never used  Substance Use Topics   Alcohol use: Yes    Alcohol/week: 0.0 standard drinks of alcohol    Comment: occasionally,1 beer once month   Drug use: No   Family History  Problem Relation Age of Onset   Colon cancer Mother 59   Hypertension Father    Diabetes type II Father  Cancer - Other Father 51       Adrenal Cancer   Prostate cancer Neg Hx    Colon polyps Neg Hx    Esophageal cancer Neg Hx    Stomach cancer Neg Hx    Rectal cancer Neg Hx    Allergies  Allergen Reactions   Shellfish Allergy Shortness Of Breath and Other (See Comments)    Reaction=tunnel vision   Amlodipine Besy-Benazepril Hcl     REACTION: Generic med did not adequatly control htnh   Sertraline Hcl Nausea Only    REACTION: nausea, ED   Current Outpatient Medications on File Prior to Visit  Medication Sig Dispense Refill   albuterol (VENTOLIN HFA) 108 (90 Base) MCG/ACT inhaler Inhale 2 puffs into the lungs every 6 (six) hours as needed for wheezing or shortness of breath. 8 g 0   aspirin 81 MG tablet Take 81 mg by mouth  daily.       Azelastine HCl (ASTEPRO) 0.15 % SOLN Place 1 spray into both nostrils 2 (two) times daily. 90 mL 3   Docusate Sodium 100 MG capsule Take 100 mg by mouth daily.     escitalopram (LEXAPRO) 20 MG tablet Take 1 tablet (20 mg total) by mouth daily. 90 tablet 3   Fexofenadine HCl (ALLEGRA PO) Take 1 tablet by mouth daily. As directed     fish oil-omega-3 fatty acids 1000 MG capsule Take 1 g by mouth daily.      fluticasone (FLONASE) 50 MCG/ACT nasal spray Place 2 sprays into both nostrils daily as needed. 48 g 3   folic acid (FOLVITE) A999333 MCG tablet Take 400 mcg by mouth daily.     gabapentin (NEURONTIN) 300 MG capsule Take 1 capsule (300 mg total) by mouth at bedtime. 90 capsule 1   glucosamine-chondroitin 500-400 MG tablet Take 1 tablet by mouth 3 (three) times daily.     hydrocortisone (ANUSOL-HC) 25 MG suppository Place 1 suppository (25 mg total) rectally every evening. As needed for 7 days as needed for rectal pain or bleeding 14 suppository 2   LOTREL 5-10 MG capsule TAKE 1 CAPSULE BY MOUTH EVERY DAY 90 capsule 1   Melatonin 5 MG TABS Take 10 mg by mouth at bedtime.     methocarbamol (ROBAXIN) 500 MG tablet Take 1 tablet (500 mg total) by mouth every 8 (eight) hours as needed for muscle spasms. 90 tablet 2   Multiple Vitamin (MULTIVITAMIN) capsule Take 1 capsule by mouth daily.     Semaglutide-Weight Management 2.4 MG/0.75ML SOAJ Inject 2.4 mg into the skin once a week. 3 mL 3   simvastatin (ZOCOR) 20 MG tablet Take 1 tablet (20 mg total) by mouth daily. 90 tablet 3   tirzepatide (MOUNJARO) 7.5 MG/0.5ML Pen Inject 7.5 mg into the skin once a week. 3 mL 0   triamcinolone cream (KENALOG) 0.5 % Apply 1 application topically 2 (two) times daily. To affected area 30 g 0   Turmeric (QC TUMERIC COMPLEX) 500 MG CAPS Take by mouth.     No current facility-administered medications on file prior to visit.    Review of Systems  Constitutional:  Positive for malaise/fatigue. Negative for  chills and fever.  HENT:  Positive for congestion and sore throat. Negative for ear pain and sinus pain.   Eyes:  Negative for blurred vision, discharge and redness.  Respiratory:  Positive for sputum production. Negative for cough, shortness of breath, wheezing and stridor.   Cardiovascular:  Negative for chest pain, palpitations  and leg swelling.  Gastrointestinal:  Negative for abdominal pain, diarrhea, nausea and vomiting.  Musculoskeletal:  Negative for myalgias.  Skin:  Negative for rash.  Neurological:  Positive for headaches. Negative for dizziness.    Observations/Objective:   Assessment and Plan: Problem List Items Addressed This Visit       Other   COVID-19 - Primary    Day 3 of symptoms mild to moderate in 62 yo with obesity and HTN Wife has it as well  After disc, opt for anti viral to lessen symptoms and prevent hospitalization risk  Sent in paxlovid, disc poss side eff and inst to hold simvastatin for 1 week  Discussed sympt control and fluid intake-see AVS Disc ER precautions  Disc isolation/masking rec  Update if not starting to improve in a week or if worsening          Relevant Medications   nirmatrelvir/ritonavir (PAXLOVID) 20 x 150 MG & 10 x 100MG TABS     Follow Up Instructions:   Drink fluids and rest  mucinex DM is good for cough and congestion  Nasal saline for congestion as needed  Tylenol for fever or pain or headache  Please alert [place] if symptoms worsen (if severe or short of breath please go to the ER)   Isolate until your symptoms are better (a minimum of 5 days) Then mask for an additional 10 days when you go back into public  Update if not starting to improve in a week or if worsening   Take Paxlovid as directed  Hold you simvastatin for a week  If any intolerable side effects let me know  I discussed the assessment and treatment plan with the patient. The patient was provided an opportunity to ask questions and all were answered. The  patient agreed with the plan and demonstrated an understanding of the instructions.   The patient was advised to call back or seek an in-person evaluation if the symptoms worsen or if the condition fails to improve as anticipated.     Loura Pardon, MD

## 2022-10-16 NOTE — Assessment & Plan Note (Signed)
Day 3 of symptoms mild to moderate in 62 yo with obesity and HTN Wife has it as well  After disc, opt for anti viral to lessen symptoms and prevent hospitalization risk  Sent in paxlovid, disc poss side eff and inst to hold simvastatin for 1 week  Discussed sympt control and fluid intake-see AVS Disc ER precautions  Disc isolation/masking rec  Update if not starting to improve in a week or if worsening

## 2022-10-16 NOTE — Telephone Encounter (Signed)
Pt said they just started med they had some wegovy left over and also they decided to wait until they were recovered from the flu to start med. Will route to PCP

## 2022-10-26 ENCOUNTER — Encounter: Payer: Self-pay | Admitting: Family Medicine

## 2022-10-27 ENCOUNTER — Telehealth: Payer: Self-pay

## 2022-10-27 MED ORDER — TIRZEPATIDE 10 MG/0.5ML ~~LOC~~ SOAJ: 10.0000 mg | SUBCUTANEOUS | 0 refills | Status: AC

## 2022-10-27 NOTE — Telephone Encounter (Signed)
The next step up is 10  I sent it to the pharmacy   If Raquel Sarna also needs it she will need to reply separately, thanks   Sorry- I have been out of town

## 2022-10-27 NOTE — Telephone Encounter (Signed)
Sent from patient in my chart.    Hi Dr. Glori Bickers, We have 1 more round of the 7.5 Mounjaro.  Can you go ahead and put in a prescription for the 12.'5mg'$  strength since the 7.'5mg'$  is only helping a little?  Please send it to the [facility] on [address] (non-[facility]) location? Thanks Gershon Mussel

## 2022-10-27 NOTE — Telephone Encounter (Signed)
Sent to provider to review in a phone note.  No further action needed at this time.

## 2022-10-28 ENCOUNTER — Encounter (HOSPITAL_BASED_OUTPATIENT_CLINIC_OR_DEPARTMENT_OTHER): Payer: Self-pay | Admitting: Pulmonary Disease

## 2022-10-28 ENCOUNTER — Ambulatory Visit (HOSPITAL_BASED_OUTPATIENT_CLINIC_OR_DEPARTMENT_OTHER): Payer: Managed Care, Other (non HMO) | Admitting: Pulmonary Disease

## 2022-10-28 VITALS — BP 110/70 | HR 84 | Ht 70.0 in | Wt 262.0 lb

## 2022-10-28 DIAGNOSIS — G4733 Obstructive sleep apnea (adult) (pediatric): Secondary | ICD-10-CM

## 2022-10-28 NOTE — Telephone Encounter (Signed)
I have sent message to patient in my chart.  No further action needed at this time.

## 2022-10-28 NOTE — Patient Instructions (Signed)
CPAP 11 cm is working well Supplies will be renewed x 1 year

## 2022-10-28 NOTE — Progress Notes (Signed)
   Subjective:    Patient ID: Jeffrey Hartman, male    DOB: Oct 06, 1960, 62 y.o.   MRN: [id number]  HPI  62 year old for follow-up for OSA  I last saw him in 2018 but he has been following up with APP, last seen 09/2021.  He had a CPAP for 10 years and this was replaced in 2023.  He is on fixed pressure of 11 cm.  He is very compliant, DME is [employer].  He is settled down with nasal pillows. He is on Mounjaro now and has lost about 40 pounds to his current weight of 262. He has a [employer]Med air mini that he uses for travel He denies any problems with mask or pressure  He forgot to use his machine 1 night and he immediately felt like he was choking and gasping is convinced that he still has OSA  Significant tests/ events reviewed  NPSG 2000:  AHI 15/hr.    Review of Systems neg for any significant sore throat, dysphagia, itching, sneezing, nasal congestion or excess/ purulent secretions, fever, chills, sweats, unintended wt loss, pleuritic or exertional cp, hempoptysis, orthopnea pnd or change in chronic leg swelling. Also denies presyncope, palpitations, heartburn, abdominal pain, nausea, vomiting, diarrhea or change in bowel or urinary habits, dysuria,hematuria, rash, arthralgias, visual complaints, headache, numbness weakness or ataxia.     Objective:   Physical Exam  Gen. Pleasant, obese, in no distress ENT - no lesions, no post nasal drip Neck: No JVD, no thyromegaly, no carotid bruits Lungs: no use of accessory muscles, no dullness to percussion, decreased without rales or rhonchi  Cardiovascular: Rhythm regular, heart sounds  normal, no murmurs or gallops, no peripheral edema Musculoskeletal: No deformities, no cyanosis or clubbing , no tremors       Assessment & Plan:   Morbid obesity -on Mounjaro, has lost 40 pounds, further weight loss encouraged

## 2022-10-28 NOTE — Assessment & Plan Note (Signed)
CPAP download was reviewed which shows good control of events 11 cm, excellent compliance almost 9 hours every night with minimal leak.  He is very compliant and CPAP is only helped improve his daytime somnolence and fatigue Based on his experience without CPAP, he certainly has residual OSA so although he has lost 40 pounds, would defer repeat sleep study for now  Weight loss encouraged, compliance with goal of at least 4-6 hrs every night is the expectation. Advised against medications with sedative side effects Cautioned against driving when sleepy - understanding that sleepiness will vary on a day to day basis

## 2022-11-10 MED ORDER — TIRZEPATIDE 10 MG/0.5ML ~~LOC~~ SOAJ: 10.0000 mg | SUBCUTANEOUS | 0 refills | Status: DC

## 2022-11-10 NOTE — Addendum Note (Signed)
Addended by: Loura Pardon A on: 11/10/2022 08:57 PM   Modules accepted: Orders

## 2022-11-12 ENCOUNTER — Other Ambulatory Visit: Payer: Self-pay | Admitting: Family Medicine

## 2022-11-13 NOTE — Telephone Encounter (Signed)
I don't think I have more options at this point for continued medication of this class unless insurance changes and decides to cover something new  we have been back and forth so much Between back order and insurance issues it is looking imposisble  His wife takes this as well

## 2022-11-13 NOTE — Telephone Encounter (Signed)
See comments from pharmacy:  MOUNJARO (4) PEN 10/0.5 is on back order.   To prescribe an alternate, please respond with appropriate changes.

## 2022-11-14 NOTE — Telephone Encounter (Signed)
Called and advised patient of message below he stated he was able to get some sent from [place]

## 2022-11-27 MED ORDER — TIRZEPATIDE 15 MG/0.5ML ~~LOC~~ SOAJ: 15.0000 mg | SUBCUTANEOUS | 1 refills | Status: DC

## 2022-11-27 NOTE — Addendum Note (Signed)
Addended by: Loura Pardon A on: 11/27/2022 08:43 PM   Modules accepted: Orders

## 2022-12-31 MED ORDER — TIRZEPATIDE 15 MG/0.5ML ~~LOC~~ SOAJ: 15.0000 mg | SUBCUTANEOUS | 1 refills | Status: DC

## 2022-12-31 NOTE — Addendum Note (Signed)
Addended by: Roxy Manns A on: 12/31/2022 08:57 PM   Modules accepted: Orders

## 2023-01-30 ENCOUNTER — Telehealth: Payer: Self-pay

## 2023-01-30 MED ORDER — TIRZEPATIDE 15 MG/0.5ML ~~LOC~~ SOAJ: 15.0000 mg | SUBCUTANEOUS | 1 refills | Status: DC

## 2023-01-30 NOTE — Telephone Encounter (Signed)
Called and informed pt of this information. 

## 2023-01-30 NOTE — Addendum Note (Signed)
Addended by: Donnamarie Poag on: 01/30/2023 01:33 PM   Modules accepted: Orders

## 2023-01-30 NOTE — Telephone Encounter (Signed)
Hi Dr. Milinda Antis,  [facility] gone by [facility] [address] multiple times over the month of May for the [facility] (15ml), and they keep telling me it's on backorder so I guess they'll never get any in stock.  Can you put in the 15ml prescription at [facility] for a 3 months with refills?  I'll send this same message for Irving Burton so you can prescribe for her too.  I was hoping that [facility] would get it so we could use the $25 card we have but if they never get it then it's a waste of time.  Sorry to keep pestering you over it.   Hope you have a wonderful weekend.  :-)  Thanks.  Jeffrey Hartman

## 2023-01-30 NOTE — Telephone Encounter (Signed)
done

## 2023-01-30 NOTE — Addendum Note (Signed)
Addended by: Roxy Manns A on: 01/30/2023 04:51 PM   Modules accepted: Orders

## 2023-02-02 ENCOUNTER — Other Ambulatory Visit: Payer: Self-pay

## 2023-02-02 MED ORDER — TIRZEPATIDE 15 MG/0.5ML ~~LOC~~ SOAJ: 15.0000 mg | SUBCUTANEOUS | 1 refills | Status: DC

## 2023-02-10 ENCOUNTER — Telehealth: Payer: Self-pay

## 2023-02-10 ENCOUNTER — Other Ambulatory Visit (HOSPITAL_COMMUNITY): Payer: Self-pay

## 2023-02-10 NOTE — Telephone Encounter (Signed)
*  Primary  PA request received via CMM for Mounjaro 15MG /0.5ML pen-injectors  PA submitted to [employer] and is pending additional questions/determination  Key: [id number]  *patient does not have DX of diabetes

## 2023-02-11 NOTE — Telephone Encounter (Signed)
Denial letter in media:

## 2023-04-01 ENCOUNTER — Encounter (INDEPENDENT_AMBULATORY_CARE_PROVIDER_SITE_OTHER): Payer: Self-pay

## 2023-04-08 ENCOUNTER — Encounter: Payer: Self-pay | Admitting: Family Medicine

## 2023-04-13 ENCOUNTER — Telehealth: Payer: Self-pay | Admitting: Family Medicine

## 2023-04-13 DIAGNOSIS — R7303 Prediabetes: Secondary | ICD-10-CM

## 2023-04-13 DIAGNOSIS — Z125 Encounter for screening for malignant neoplasm of prostate: Secondary | ICD-10-CM

## 2023-04-13 DIAGNOSIS — I10 Essential (primary) hypertension: Secondary | ICD-10-CM

## 2023-04-13 DIAGNOSIS — R7989 Other specified abnormal findings of blood chemistry: Secondary | ICD-10-CM

## 2023-04-13 DIAGNOSIS — E7800 Pure hypercholesterolemia, unspecified: Secondary | ICD-10-CM

## 2023-04-13 NOTE — Telephone Encounter (Signed)
-----   Message from Lovena Neighbours sent at 03/27/2023  1:40 PM EDT ----- Regarding: Labs for 8.13.24 Pt is on lab schedule for 8.13.24, please put orders in future. Thank you, Denny Peon

## 2023-04-14 ENCOUNTER — Other Ambulatory Visit (INDEPENDENT_AMBULATORY_CARE_PROVIDER_SITE_OTHER): Payer: Managed Care, Other (non HMO)

## 2023-04-14 DIAGNOSIS — I10 Essential (primary) hypertension: Secondary | ICD-10-CM

## 2023-04-14 DIAGNOSIS — Z125 Encounter for screening for malignant neoplasm of prostate: Secondary | ICD-10-CM

## 2023-04-14 DIAGNOSIS — E7800 Pure hypercholesterolemia, unspecified: Secondary | ICD-10-CM

## 2023-04-14 DIAGNOSIS — R7303 Prediabetes: Secondary | ICD-10-CM | POA: Diagnosis not present

## 2023-04-14 DIAGNOSIS — R7989 Other specified abnormal findings of blood chemistry: Secondary | ICD-10-CM | POA: Diagnosis not present

## 2023-04-14 LAB — CBC WITH DIFFERENTIAL/PLATELET
Basophils Absolute: 0 10*3/uL (ref 0.0–0.1)
Basophils Relative: 0.7 % (ref 0.0–3.0)
Eosinophils Absolute: 0.2 10*3/uL (ref 0.0–0.7)
Eosinophils Relative: 4 % (ref 0.0–5.0)
HCT: 40.5 % (ref 39.0–52.0)
Hemoglobin: 13.2 g/dL (ref 13.0–17.0)
Lymphocytes Relative: 25.1 % (ref 12.0–46.0)
Lymphs Abs: 1.4 10*3/uL (ref 0.7–4.0)
MCHC: 32.6 g/dL (ref 30.0–36.0)
MCV: 89.5 fl (ref 78.0–100.0)
Monocytes Absolute: 0.5 10*3/uL (ref 0.1–1.0)
Monocytes Relative: 9.1 % (ref 3.0–12.0)
Neutro Abs: 3.4 10*3/uL (ref 1.4–7.7)
Neutrophils Relative %: 61.1 % (ref 43.0–77.0)
Platelets: 272 10*3/uL (ref 150.0–400.0)
RBC: 4.53 Mil/uL (ref 4.22–5.81)
RDW: 14.5 % (ref 11.5–15.5)
WBC: 5.6 10*3/uL (ref 4.0–10.5)

## 2023-04-14 LAB — COMPREHENSIVE METABOLIC PANEL
ALT: 30 U/L (ref 0–53)
AST: 25 U/L (ref 0–37)
Albumin: 4.2 g/dL (ref 3.5–5.2)
Alkaline Phosphatase: 55 U/L (ref 39–117)
BUN: 19 mg/dL (ref 6–23)
CO2: 24 mEq/L (ref 19–32)
Calcium: 9.3 mg/dL (ref 8.4–10.5)
Chloride: 103 mEq/L (ref 96–112)
Creatinine, Ser: 1.06 mg/dL (ref 0.40–1.50)
GFR: 76.43 mL/min (ref >60.00)
Glucose, Bld: 92 mg/dL (ref 70–99)
Potassium: 4.1 mEq/L (ref 3.5–5.1)
Sodium: 138 mEq/L (ref 135–145)
Total Bilirubin: 0.4 mg/dL (ref 0.2–1.2)
Total Protein: 6.6 g/dL (ref 6.0–8.3)

## 2023-04-14 LAB — LIPID PANEL
Cholesterol: 186 mg/dL (ref 0–200)
HDL: 39.7 mg/dL (ref >39.00)
LDL Cholesterol: 122 mg/dL — ABNORMAL HIGH (ref 0–99)
NonHDL: 146.01
Total CHOL/HDL Ratio: 5
Triglycerides: 119 mg/dL (ref 0.0–149.0)
VLDL: 23.8 mg/dL (ref 0.0–40.0)

## 2023-04-14 LAB — HEMOGLOBIN A1C: Hgb A1c MFr Bld: 5.6 % (ref 4.6–6.5)

## 2023-04-14 LAB — TSH: TSH: 2.8 u[IU]/mL (ref 0.35–5.50)

## 2023-04-14 LAB — PSA: PSA: 0.87 ng/mL (ref 0.10–4.00)

## 2023-04-14 LAB — TESTOSTERONE: Testosterone: 314.8 ng/dL (ref 300.00–890.00)

## 2023-04-21 ENCOUNTER — Encounter: Payer: Self-pay | Admitting: Family Medicine

## 2023-04-21 ENCOUNTER — Ambulatory Visit (INDEPENDENT_AMBULATORY_CARE_PROVIDER_SITE_OTHER): Payer: Managed Care, Other (non HMO) | Admitting: Family Medicine

## 2023-04-21 VITALS — BP 123/65 | HR 80 | Temp 98.2°F | Ht 69.75 in | Wt 272.0 lb

## 2023-04-21 DIAGNOSIS — R7989 Other specified abnormal findings of blood chemistry: Secondary | ICD-10-CM

## 2023-04-21 DIAGNOSIS — Z800 Family history of malignant neoplasm of digestive organs: Secondary | ICD-10-CM

## 2023-04-21 DIAGNOSIS — Z6839 Body mass index (BMI) 39.0-39.9, adult: Secondary | ICD-10-CM

## 2023-04-21 DIAGNOSIS — Z125 Encounter for screening for malignant neoplasm of prostate: Secondary | ICD-10-CM

## 2023-04-21 DIAGNOSIS — F429 Obsessive-compulsive disorder, unspecified: Secondary | ICD-10-CM

## 2023-04-21 DIAGNOSIS — H6123 Impacted cerumen, bilateral: Secondary | ICD-10-CM

## 2023-04-21 DIAGNOSIS — E7800 Pure hypercholesterolemia, unspecified: Secondary | ICD-10-CM

## 2023-04-21 DIAGNOSIS — I10 Essential (primary) hypertension: Secondary | ICD-10-CM | POA: Diagnosis not present

## 2023-04-21 DIAGNOSIS — R7303 Prediabetes: Secondary | ICD-10-CM

## 2023-04-21 DIAGNOSIS — G4733 Obstructive sleep apnea (adult) (pediatric): Secondary | ICD-10-CM

## 2023-04-21 DIAGNOSIS — Z23 Encounter for immunization: Secondary | ICD-10-CM

## 2023-04-21 DIAGNOSIS — Z0000 Encounter for general adult medical examination without abnormal findings: Secondary | ICD-10-CM | POA: Diagnosis not present

## 2023-04-21 NOTE — Assessment & Plan Note (Signed)
Lab Results  Component Value Date   HGBA1C 5.6 04/14/2023   disc imp of low glycemic diet and wt loss to prevent DM2  Plan to continue generic mounjaro if covered and available

## 2023-04-21 NOTE — Patient Instructions (Addendum)
Td vaccine today   Get flu shot and covid booster in the fall   You can get the RSV vaccine at the pharmacy    Take a look at the info on cardiac calcium score  Usual cost is 200$ out of pocket  Read about it and let me know   Continue walk/elliptical Add some strength training to your routine, this is important for bone and brain health and can reduce your risk of falls and help your body use insulin properly and regulate weight  Light weights, exercise bands , and internet videos are a good way to start  Yoga (chair or regular), machines , floor exercises or a gym with machines are also good options   Stop the glucosamine for now -it may be worsening your cholesterol    Follow up for ear irrigation when you can

## 2023-04-21 NOTE — Assessment & Plan Note (Signed)
Lab Results  Component Value Date   PSA 0.87 04/14/2023   PSA 0.71 04/11/2022   PSA 0.63 04/10/2021    No clinical changes

## 2023-04-21 NOTE — Assessment & Plan Note (Signed)
Disc goals for lipids and reasons to control them Rev last labs with pt Rev low sat fat diet in detail  LDL up to 122  ? If glucosamine is increasing this -plans to hold it  Diet not optimal this mo due to elder care Encouraged to continues simvastatin  Taking omega 3

## 2023-04-21 NOTE — Assessment & Plan Note (Signed)
Discussed how this problem influences overall health and the risks it imposes  Reviewed plan for weight loss with lower calorie diet (via better food choices (lower glycemic and portion control) along with exercise building up to or more than 30 minutes 5 days per week including some aerobic activity and strength training   Pt will continue mounjaro as long as it is covered Jeffrey Hartman

## 2023-04-21 NOTE — Assessment & Plan Note (Signed)
Due for irrigation Will return for this

## 2023-04-21 NOTE — Assessment & Plan Note (Signed)
Continues cpap and weight loss effort

## 2023-04-21 NOTE — Assessment & Plan Note (Signed)
bp in fair control at this time  BP Readings from Last 1 Encounters:  04/21/23 123/65   No changes needed-has to take DAW lotrel 5-10 mg daily  In past generic does not work  Most recent labs reviewed  Disc lifstyle change with low sodium diet and exercise  I do not think low bp is causing his intermittent light headedness but will follow closely  Enc good hydration  Enc him to continue working on weight loss

## 2023-04-21 NOTE — Assessment & Plan Note (Signed)
Reviewed health habits including diet and exercise and skin cancer prevention Reviewed appropriate screening tests for age  Also reviewed health mt list, fam hx and immunization status , as well as social and family history   See HPI Labs reviewed and ordered Td updated  Will get flu shot in the fall Desires RSV vaccine-plans ot get in pharmacy Psa stable Colonoscopy screening due 07/2027 Discussed bone health - incl vit D and exercise  PHQ0   doing well with elder care stress

## 2023-04-21 NOTE — Assessment & Plan Note (Signed)
Continues to do well with lexapro despite high stress  Encouraged self care Reviewed stressors/ coping techniques/symptoms/ support sources/ tx options and side effects in detail today  Encouraged good self care

## 2023-04-21 NOTE — Assessment & Plan Note (Signed)
Next colonoscopy due 07/2027  Mother had colon cancer at 25

## 2023-04-21 NOTE — Progress Notes (Signed)
Subjective:    Patient ID: Jeffrey Hartman, male    DOB: 24-Jul-1961, 62 y.o.   MRN: [id number]  HPI  Here for health maintenance exam and to review chronic medical problems   Wt Readings from Last 3 Encounters:  04/21/23 272 lb (123.4 kg)  10/28/22 262 lb (118.8 kg)  09/29/22 262 lb 4 oz (119 kg)   39.31 kg/m  Vitals:   04/21/23 1408 04/21/23 1440  BP: (!) 144/80 123/65  Pulse: 80   Temp: 98.2 F (36.8 C)   SpO2: 93%     Immunization History  Administered Date(s) Administered   Covid-19, Mrna,Vaccine(Spikevax)76yrs and older 06/19/2022   Influenza Split 06/07/2012, 07/02/2017   Influenza Whole 06/02/2007, 04/28/2009, 04/23/2010, 05/03/2011   Influenza, Seasonal, Injecte, Preservative Fre 05/28/2016   Influenza,inj,Quad PF,6+ Mos 05/09/2013, 05/30/2019   Influenza-Unspecified 06/01/2014, 06/21/2015, 07/02/2018, 05/13/2021, 06/19/2022   PFIZER(Purple Top)SARS-COV-2 Vaccination 11/09/2019, 11/30/2019, 06/18/2020   Pfizer Covid-19 Vaccine Bivalent Booster 44yrs & up 05/13/2021   Respiratory Syncytial Virus Vaccine,Recomb Aduvanted(Arexvy) 06/19/2022   Td 03/31/2003, 04/21/2023   Tdap 12/29/2012   Zoster Recombinant(Shingrix) 04/15/2019, 06/21/2019   Zoster, Live 07/04/2015    There are no preventive care reminders to display for this patient.  Doing fine  Busy caring for inlaws  A lot of work  Not much time for marriage and self care   Hopeful to get back into a routine    Tetanus booster -wants today  Flu shot -will get in fall with covid shot   Wants to get the RSV vaccine in the future    Prostate health Lab Results  Component Value Date   PSA 0.87 04/14/2023   PSA 0.71 04/11/2022   PSA 0.63 04/10/2021   Sees urology for low testosterone    Lab Results  Component Value Date   TESTOSTERONE 314.80 04/14/2023  Low normal range    Colon cancer screening -colonoscopy due 07/2027 Mother had colon cancer at 55  Bone health  Falls -none  (did  have a fainting episode once)  Fractures-none  Supplements  -vit D  Exercise : when schedule allows/ walks the neighborhood and uses elliptial    Mood    04/21/2023    2:53 PM 09/29/2022   12:03 PM 04/17/2021    4:17 PM 04/16/2020    3:34 PM 04/15/2019    2:38 PM  Depression screen PHQ 2/9  Decreased Interest 0 0 0 0 0  Down, Depressed, Hopeless 0 0 0 0 0  PHQ - 2 Score 0 0 0 0 0  Altered sleeping 0 0 1    Tired, decreased energy 0 0 0    Change in appetite 0 0 0    Feeling bad or failure about yourself  0 0 0    Trouble concentrating 0 0 0    Moving slowly or fidgety/restless 0 0 0    Suicidal thoughts 0 0 0    PHQ-9 Score 0 0 1    Difficult doing work/chores Not difficult at all Not difficult at all Not difficult at all      Takes lexapro for mood and OCD   HTN bp is stable today  No cp or palpitations or headaches or edema  No side effects to medicines  BP Readings from Last 3 Encounters:  04/21/23 123/65  10/28/22 110/70  10/16/22 128/82    DAW lotrel 5-10 mg daily  Generic in past did not work   OSA Cpap   Hyperlipidemia Lab Results  Component  Value Date   CHOL 186 04/14/2023   CHOL 145 04/11/2022   CHOL 150 04/10/2021   Lab Results  Component Value Date   HDL 39.70 04/14/2023   HDL 32.60 (L) 04/11/2022   HDL 31.60 (L) 04/10/2021   Lab Results  Component Value Date   LDLCALC 122 (H) 04/14/2023   LDLCALC 84 04/11/2022   LDLCALC 92 04/10/2021   Lab Results  Component Value Date   TRIG 119.0 04/14/2023   TRIG 139.0 04/11/2022   TRIG 134.0 04/10/2021   Lab Results  Component Value Date   CHOLHDL 5 04/14/2023   CHOLHDL 4 04/11/2022   CHOLHDL 5 04/10/2021   Lab Results  Component Value Date   LDLDIRECT 109.0 09/21/2009   Omega 3 supplement Simvastatin 20 mg daily  Not eating as well with elder care- but will get back to it   On glucosamine     Prediabetes Lab Results  Component Value Date   HGBA1C 5.6 04/14/2023  Down from 6.0   Was on GLP for weight when he could afford   Mounjaro when he can get   Cerumen impaction - may need irritation   Some arthritis issues   Bump on foot - right lateral / callus     Patient Active Problem List   Diagnosis Date Noted   Light headedness 09/29/2022   Family history of colon cancer 07/21/2022   Insomnia 04/17/2021   Prediabetes 04/10/2020   Eczema 08/24/2015   Cerumen impaction 06/16/2014   Low testosterone 11/09/2013   Prostate cancer screening 11/02/2013   Adenomatous colon polyp 07/28/2012   Family history of malignant neoplasm of gastrointestinal tract 06/29/2012   Anal fissure 06/29/2012   Personal history of colonic polyps 06/29/2012   Routine general medical examination at a health care facility 09/17/2011   Class 2 obesity due to excess calories with body mass index (BMI) of 39.0 to 39.9 in adult 01/01/2010   Allergic rhinitis 10/13/2008   Obstructive sleep apnea 08/11/2007   Obsessive-compulsive disorder 01/21/2007   ACTINIC SKIN DAMAGE 01/21/2007   Hyperlipidemia 01/13/2007   Essential hypertension 01/13/2007   Past Medical History:  Diagnosis Date   Anxiety states    Dermatophytosis of nail    Obsessive-compulsive disorders    Obstructive sleep apnea (adult) (pediatric)    Other and unspecified hyperlipidemia    Seasonal allergic rhinitis    Sleep apnea    Unspecified dermatitis due to sun    Unspecified essential hypertension    Unspecified sleep apnea    Past Surgical History:  Procedure Laterality Date   COLONOSCOPY  11/02, 11/05   polyps   CYSTECTOMY     cyst on hand   Social History   Tobacco Use   Smoking status: Never   Smokeless tobacco: Never  Vaping Use   Vaping status: Never Used  Substance Use Topics   Alcohol use: Yes    Alcohol/week: 0.0 standard drinks of alcohol    Comment: occasionally,1 beer once month   Drug use: No   Family History  Problem Relation Age of Onset   Colon cancer Mother 80   Hypertension  Father    Diabetes type II Father    Cancer - Other Father 75       Adrenal Cancer   Prostate cancer Neg Hx    Colon polyps Neg Hx    Esophageal cancer Neg Hx    Stomach cancer Neg Hx    Rectal cancer Neg Hx    Allergies  Allergen Reactions   Shellfish Allergy Shortness Of Breath and Other (See Comments)    Reaction=tunnel vision   Amlodipine Besy-Benazepril Hcl     REACTION: Generic med did not adequatly control htnh   Sertraline Hcl Nausea Only    REACTION: nausea, ED   Current Outpatient Medications on File Prior to Visit  Medication Sig Dispense Refill   albuterol (VENTOLIN HFA) 108 (90 Base) MCG/ACT inhaler Inhale 2 puffs into the lungs every 6 (six) hours as needed for wheezing or shortness of breath. 8 g 0   aspirin 81 MG tablet Take 81 mg by mouth daily.       Azelastine HCl (ASTEPRO) 0.15 % SOLN Place 1 spray into both nostrils 2 (two) times daily. 90 mL 3   Docusate Sodium 100 MG capsule Take 100 mg by mouth daily.     escitalopram (LEXAPRO) 20 MG tablet Take 1 tablet (20 mg total) by mouth daily. 90 tablet 3   Fexofenadine HCl (ALLEGRA PO) Take 1 tablet by mouth daily. As directed     fish oil-omega-3 fatty acids 1000 MG capsule Take 1 g by mouth daily.      fluticasone (FLONASE) 50 MCG/ACT nasal spray Place 2 sprays into both nostrils daily as needed. 48 g 3   folic acid (FOLVITE) 400 MCG tablet Take 400 mcg by mouth daily.     gabapentin (NEURONTIN) 300 MG capsule Take 1 capsule (300 mg total) by mouth at bedtime. 90 capsule 1   glucosamine-chondroitin 500-400 MG tablet Take 1 tablet by mouth 3 (three) times daily.     hydrocortisone (ANUSOL-HC) 25 MG suppository Place 1 suppository (25 mg total) rectally every evening. As needed for 7 days as needed for rectal pain or bleeding 14 suppository 2   LOTREL 5-10 MG capsule TAKE 1 CAPSULE BY MOUTH EVERY DAY 90 capsule 1   Melatonin 5 MG TABS Take 10 mg by mouth at bedtime.     methocarbamol (ROBAXIN) 500 MG tablet Take 1  tablet (500 mg total) by mouth every 8 (eight) hours as needed for muscle spasms. 90 tablet 2   Multiple Vitamin (MULTIVITAMIN) capsule Take 1 capsule by mouth daily.     simvastatin (ZOCOR) 20 MG tablet Take 1 tablet (20 mg total) by mouth daily. 90 tablet 3   tirzepatide (MOUNJARO) 15 MG/0.5ML Pen Inject 15 mg into the skin once a week. 6 mL 1   triamcinolone cream (KENALOG) 0.5 % Apply 1 application topically 2 (two) times daily. To affected area 30 g 0   Turmeric (QC TUMERIC COMPLEX) 500 MG CAPS Take by mouth.     No current facility-administered medications on file prior to visit.    Review of Systems  Constitutional:  Negative for activity change, appetite change, fatigue, fever and unexpected weight change.  HENT:  Positive for hearing loss. Negative for congestion, rhinorrhea, sore throat and trouble swallowing.   Eyes:  Negative for pain, redness, itching and visual disturbance.  Respiratory:  Negative for cough, chest tightness, shortness of breath and wheezing.   Cardiovascular:  Negative for chest pain and palpitations.  Gastrointestinal:  Negative for abdominal pain, blood in stool, constipation, diarrhea and nausea.  Endocrine: Negative for cold intolerance, heat intolerance, polydipsia and polyuria.  Genitourinary:  Negative for difficulty urinating, dysuria, frequency and urgency.  Musculoskeletal:  Positive for arthralgias. Negative for joint swelling and myalgias.  Skin:  Negative for pallor and rash.  Neurological:  Negative for dizziness, tremors, weakness, numbness and headaches.  Hematological:  Negative for adenopathy. Does not bruise/bleed easily.  Psychiatric/Behavioral:  Negative for decreased concentration and dysphoric mood. The patient is not nervous/anxious.        Stressors noted        Objective:   Physical Exam Constitutional:      General: He is not in acute distress.    Appearance: Normal appearance. He is well-developed. He is not ill-appearing or  diaphoretic.  HENT:     Head: Normocephalic and atraumatic.     Right Ear: Tympanic membrane, ear canal and external ear normal. There is impacted cerumen.     Left Ear: Tympanic membrane, ear canal and external ear normal. There is impacted cerumen.     Nose: Nose normal. No congestion.     Mouth/Throat:     Mouth: Mucous membranes are moist.     Pharynx: Oropharynx is clear. No posterior oropharyngeal erythema.  Eyes:     General: No scleral icterus.       Right eye: No discharge.        Left eye: No discharge.     Conjunctiva/sclera: Conjunctivae normal.     Pupils: Pupils are equal, round, and reactive to light.  Neck:     Thyroid: No thyromegaly.     Vascular: No carotid bruit or JVD.  Cardiovascular:     Rate and Rhythm: Normal rate and regular rhythm.     Pulses: Normal pulses.     Heart sounds: Normal heart sounds.     No gallop.  Pulmonary:     Effort: Pulmonary effort is normal. No respiratory distress.     Breath sounds: Normal breath sounds. No wheezing or rales.     Comments: Good air exch Chest:     Chest wall: No tenderness.  Abdominal:     General: Bowel sounds are normal. There is no distension or abdominal bruit.     Palpations: Abdomen is soft. There is no mass.     Tenderness: There is no abdominal tenderness.     Hernia: No hernia is present.  Musculoskeletal:        General: No tenderness.     Cervical back: Normal range of motion and neck supple. No rigidity. No muscular tenderness.     Right lower leg: No edema.     Left lower leg: No edema.     Comments: No acute joint changes   Lymphadenopathy:     Cervical: No cervical adenopathy.  Skin:    General: Skin is warm and dry.     Coloration: Skin is not pale.     Findings: No erythema or rash.     Comments: Solar lentigines diffusely  Callus on medial left foot near MTP  Neurological:     Mental Status: He is alert.     Cranial Nerves: No cranial nerve deficit.     Motor: No abnormal muscle  tone.     Coordination: Coordination normal.     Gait: Gait normal.     Deep Tendon Reflexes: Reflexes are normal and symmetric. Reflexes normal.  Psychiatric:        Mood and Affect: Mood normal.        Cognition and Memory: Cognition and memory normal.           Assessment & Plan:   Problem List Items Addressed This Visit       Cardiovascular and Mediastinum   Essential hypertension    bp in fair control at this time  BP Readings from  Last 1 Encounters:  04/21/23 123/65   No changes needed-has to take DAW lotrel 5-10 mg daily  In past generic does not work  Most recent labs reviewed  Disc lifstyle change with low sodium diet and exercise  I do not think low bp is causing his intermittent light headedness but will follow closely  Enc good hydration  Enc him to continue working on weight loss        Respiratory   Obstructive sleep apnea    Continues cpap and weight loss effort         Nervous and Auditory   Cerumen impaction    Due for irrigation Will return for this          Other   Routine general medical examination at a health care facility - Primary    Reviewed health habits including diet and exercise and skin cancer prevention Reviewed appropriate screening tests for age  Also reviewed health mt list, fam hx and immunization status , as well as social and family history   See HPI Labs reviewed and ordered Td updated  Will get flu shot in the fall Desires RSV vaccine-plans ot get in pharmacy Psa stable Colonoscopy screening due 07/2027 Discussed bone health - incl vit D and exercise  PHQ0   doing well with elder care stress       Relevant Orders   Td : Tetanus/diphtheria >7yo Preservative  free (Completed)   Prostate cancer screening    Lab Results  Component Value Date   PSA 0.87 04/14/2023   PSA 0.71 04/11/2022   PSA 0.63 04/10/2021    No clinical changes       Prediabetes    Lab Results  Component Value Date   HGBA1C 5.6  04/14/2023   disc imp of low glycemic diet and wt loss to prevent DM2  Plan to continue generic mounjaro if covered and available      Obsessive-compulsive disorder    Continues to do well with lexapro despite high stress  Encouraged self care Reviewed stressors/ coping techniques/symptoms/ support sources/ tx options and side effects in detail today  Encouraged good self care       Low testosterone    Lab Results  Component Value Date   TESTOSTERONE 314.80 04/14/2023   This is low normal range       Hyperlipidemia    Disc goals for lipids and reasons to control them Rev last labs with pt Rev low sat fat diet in detail  LDL up to 122  ? If glucosamine is increasing this -plans to hold it  Diet not optimal this mo due to elder care Encouraged to continues simvastatin  Taking omega 3        Family history of malignant neoplasm of gastrointestinal tract    Next colonoscopy due 07/2027  Mother had colon cancer at 28       Class 2 obesity due to excess calories with body mass index (BMI) of 39.0 to 39.9 in adult    Discussed how this problem influences overall health and the risks it imposes  Reviewed plan for weight loss with lower calorie diet (via better food choices (lower glycemic and portion control) along with exercise building up to or more than 30 minutes 5 days per week including some aerobic activity and strength training   Pt will continue mounjaro as long as it is covered Jeffrey Hartman       Other Visit Diagnoses     Need  for Td vaccine       Relevant Orders   Td : Tetanus/diphtheria >7yo Preservative  free (Completed)

## 2023-04-21 NOTE — Assessment & Plan Note (Signed)
Lab Results  Component Value Date   TESTOSTERONE 314.80 04/14/2023   This is low normal range

## 2023-04-27 ENCOUNTER — Ambulatory Visit: Payer: Managed Care, Other (non HMO) | Admitting: Family Medicine

## 2023-04-28 ENCOUNTER — Ambulatory Visit: Payer: Managed Care, Other (non HMO) | Admitting: Family Medicine

## 2023-04-30 MED ORDER — GABAPENTIN 300 MG PO CAPS: 300.0000 mg | ORAL_CAPSULE | Freq: Every day | ORAL | 3 refills | Status: DC

## 2023-04-30 MED ORDER — METHOCARBAMOL 500 MG PO TABS: 500.0000 mg | ORAL_TABLET | Freq: Three times a day (TID) | ORAL | 2 refills | Status: DC | PRN

## 2023-04-30 MED ORDER — LOTREL 5-10 MG PO CAPS: 1.0000 | ORAL_CAPSULE | Freq: Every day | ORAL | 3 refills | Status: DC

## 2023-04-30 MED ORDER — SIMVASTATIN 20 MG PO TABS: 20.0000 mg | ORAL_TABLET | Freq: Every day | ORAL | 3 refills | Status: DC

## 2023-04-30 MED ORDER — ESCITALOPRAM OXALATE 20 MG PO TABS: 20.0000 mg | ORAL_TABLET | Freq: Every day | ORAL | 3 refills | Status: DC

## 2023-05-05 ENCOUNTER — Encounter: Payer: Self-pay | Admitting: Family Medicine

## 2023-05-05 ENCOUNTER — Ambulatory Visit (INDEPENDENT_AMBULATORY_CARE_PROVIDER_SITE_OTHER): Payer: Managed Care, Other (non HMO) | Admitting: Family Medicine

## 2023-05-05 VITALS — BP 126/78 | HR 76 | Temp 97.8°F | Ht 69.75 in | Wt 271.0 lb

## 2023-05-05 DIAGNOSIS — H6123 Impacted cerumen, bilateral: Secondary | ICD-10-CM

## 2023-05-05 DIAGNOSIS — E7800 Pure hypercholesterolemia, unspecified: Secondary | ICD-10-CM | POA: Diagnosis not present

## 2023-05-05 NOTE — Assessment & Plan Note (Signed)
Bilateral  Irrigated today with relief and improvement in hearing    Encouraged to continue peroxide or debrox on a schedule

## 2023-05-05 NOTE — Assessment & Plan Note (Signed)
Pt is interested in a cardiac ca score test Ordered today   No angina symptoms   Disc goals for lipids and reasons to control them Rev last labs with pt Rev low sat fat diet in detail  Continues simvastatin

## 2023-05-05 NOTE — Patient Instructions (Signed)
I placed an order for the cardiac calcium score test  I put the referral in  Please let us know if you don't hear in 1-2 weeks    Ears look better Keep using peroxide or debrox on a regular basis to keep it in control   You will continue to improve as water drains out (hearing wise)

## 2023-05-05 NOTE — Progress Notes (Signed)
Subjective:    Patient ID: Jeffrey Hartman, male    DOB: 08/06/1961, 62 y.o.   MRN: [id number]  HPI  Wt Readings from Last 3 Encounters:  05/05/23 271 lb (122.9 kg)  04/21/23 272 lb (123.4 kg)  10/28/22 262 lb (118.8 kg)   39.16 kg/m  Vitals:   05/05/23 1545  BP: 126/78  Pulse: 76  Temp: 97.8 F (36.6 C)  SpO2: 93%    Pt presents for follow up of ear fullness from cerumen impaction  Also wants to schedule cardiac ca score   Making it hard to hear   Uses peroxide in ears monthly to loosen the cerumen      Patient Active Problem List   Diagnosis Date Noted   Light headedness 09/29/2022   Family history of colon cancer 07/21/2022   Insomnia 04/17/2021   Prediabetes 04/10/2020   Eczema 08/24/2015   Cerumen impaction 06/16/2014   Low testosterone 11/09/2013   Prostate cancer screening 11/02/2013   Adenomatous colon polyp 07/28/2012   Family history of malignant neoplasm of gastrointestinal tract 06/29/2012   Anal fissure 06/29/2012   Personal history of colonic polyps 06/29/2012   Routine general medical examination at a health care facility 09/17/2011   Class 2 obesity due to excess calories with body mass index (BMI) of 39.0 to 39.9 in adult 01/01/2010   Allergic rhinitis 10/13/2008   Obstructive sleep apnea 08/11/2007   Obsessive-compulsive disorder 01/21/2007   ACTINIC SKIN DAMAGE 01/21/2007   Hyperlipidemia 01/13/2007   Essential hypertension 01/13/2007   Past Medical History:  Diagnosis Date   Anxiety states    Dermatophytosis of nail    Obsessive-compulsive disorders    Obstructive sleep apnea (adult) (pediatric)    Other and unspecified hyperlipidemia    Seasonal allergic rhinitis    Sleep apnea    Unspecified dermatitis due to sun    Unspecified essential hypertension    Unspecified sleep apnea    Past Surgical History:  Procedure Laterality Date   COLONOSCOPY  11/02, 11/05   polyps   CYSTECTOMY     cyst on hand   Social History    Tobacco Use   Smoking status: Never   Smokeless tobacco: Never  Vaping Use   Vaping status: Never Used  Substance Use Topics   Alcohol use: Yes    Alcohol/week: 0.0 standard drinks of alcohol    Comment: occasionally,1 beer once month   Drug use: No   Family History  Problem Relation Age of Onset   Colon cancer Mother 59   Hypertension Father    Diabetes type II Father    Cancer - Other Father 75       Adrenal Cancer   Prostate cancer Neg Hx    Colon polyps Neg Hx    Esophageal cancer Neg Hx    Stomach cancer Neg Hx    Rectal cancer Neg Hx    Allergies  Allergen Reactions   Shellfish Allergy Shortness Of Breath and Other (See Comments)    Reaction=tunnel vision   Amlodipine Besy-Benazepril Hcl     REACTION: Generic med did not adequatly control htnh   Sertraline Hcl Nausea Only    REACTION: nausea, ED   Current Outpatient Medications on File Prior to Visit  Medication Sig Dispense Refill   albuterol (VENTOLIN HFA) 108 (90 Base) MCG/ACT inhaler Inhale 2 puffs into the lungs every 6 (six) hours as needed for wheezing or shortness of breath. 8 g 0   aspirin 81 MG  tablet Take 81 mg by mouth daily.       Azelastine HCl (ASTEPRO) 0.15 % SOLN Place 1 spray into both nostrils 2 (two) times daily. 90 mL 3   Docusate Sodium 100 MG capsule Take 100 mg by mouth daily.     escitalopram (LEXAPRO) 20 MG tablet Take 1 tablet (20 mg total) by mouth daily. 90 tablet 3   Fexofenadine HCl (ALLEGRA PO) Take 1 tablet by mouth daily. As directed     fish oil-omega-3 fatty acids 1000 MG capsule Take 1 g by mouth daily.      fluticasone (FLONASE) 50 MCG/ACT nasal spray Place 2 sprays into both nostrils daily as needed. 48 g 3   folic acid (FOLVITE) 400 MCG tablet Take 400 mcg by mouth daily.     gabapentin (NEURONTIN) 300 MG capsule Take 1 capsule (300 mg total) by mouth at bedtime. 90 capsule 3   glucosamine-chondroitin 500-400 MG tablet Take 1 tablet by mouth 3 (three) times daily.      hydrocortisone (ANUSOL-HC) 25 MG suppository Place 1 suppository (25 mg total) rectally every evening. As needed for 7 days as needed for rectal pain or bleeding 14 suppository 2   LOTREL 5-10 MG capsule Take 1 capsule by mouth daily. 90 capsule 3   Melatonin 5 MG TABS Take 10 mg by mouth at bedtime.     methocarbamol (ROBAXIN) 500 MG tablet Take 1 tablet (500 mg total) by mouth every 8 (eight) hours as needed for muscle spasms. 90 tablet 2   Multiple Vitamin (MULTIVITAMIN) capsule Take 1 capsule by mouth daily.     simvastatin (ZOCOR) 20 MG tablet Take 1 tablet (20 mg total) by mouth daily. 90 tablet 3   tirzepatide (MOUNJARO) 15 MG/0.5ML Pen Inject 15 mg into the skin once a week. 6 mL 1   triamcinolone cream (KENALOG) 0.5 % Apply 1 application topically 2 (two) times daily. To affected area 30 g 0   Turmeric (QC TUMERIC COMPLEX) 500 MG CAPS Take by mouth.     No current facility-administered medications on file prior to visit.    Review of Systems  Constitutional:  Negative for activity change, appetite change, fatigue, fever and unexpected weight change.  HENT:  Positive for hearing loss. Negative for congestion, ear discharge, ear pain, facial swelling, rhinorrhea, sore throat and trouble swallowing.   Eyes:  Negative for pain, redness, itching and visual disturbance.  Respiratory:  Negative for cough, chest tightness, shortness of breath and wheezing.   Cardiovascular:  Negative for chest pain and palpitations.  Gastrointestinal:  Negative for abdominal pain, blood in stool, constipation, diarrhea and nausea.  Endocrine: Negative for cold intolerance, heat intolerance, polydipsia and polyuria.  Genitourinary:  Negative for difficulty urinating, dysuria, frequency and urgency.  Musculoskeletal:  Negative for arthralgias, joint swelling and myalgias.  Skin:  Negative for pallor and rash.  Neurological:  Negative for dizziness, tremors, weakness, numbness and headaches.  Hematological:   Negative for adenopathy. Does not bruise/bleed easily.  Psychiatric/Behavioral:  Negative for decreased concentration and dysphoric mood. The patient is not nervous/anxious.        Objective:   Physical Exam Constitutional:      General: He is not in acute distress.    Appearance: Normal appearance. He is obese. He is not ill-appearing.  HENT:     Head: Normocephalic and atraumatic.     Right Ear: There is impacted cerumen.     Left Ear: There is impacted cerumen.  Ears:     Comments: Bilateral impacted dry cerument   Procedure: Cerumen Disimpaction After consent obtained  Warm water was applied and gentle ear lavage performed on both ears.    There were no complications and following the disimpaction the tympanic membrane were visible on the bilateral. Tympanic membranes are intact following the procedure.  Auditory canals are normal.  The patient reported relief of symptoms after removal of cerumen.  TMs were visible and clear on re check  Normal appearing canals     Mouth/Throat:     Mouth: Mucous membranes are moist.  Eyes:     Conjunctiva/sclera: Conjunctivae normal.     Pupils: Pupils are equal, round, and reactive to light.  Cardiovascular:     Rate and Rhythm: Normal rate and regular rhythm.  Pulmonary:     Effort: Pulmonary effort is normal. No respiratory distress.  Skin:    General: Skin is warm and dry.     Findings: No erythema or rash.  Neurological:     Mental Status: He is alert.  Psychiatric:        Mood and Affect: Mood normal.           Assessment & Plan:   Problem List Items Addressed This Visit       Nervous and Auditory   Cerumen impaction - Primary    Bilateral  Irrigated today with relief and improvement in hearing    Encouraged to continue peroxide or debrox on a schedule         Other   Hyperlipidemia    Pt is interested in a cardiac ca score test Ordered today   No angina symptoms   Disc goals for lipids and reasons to  control them Rev last labs with pt Rev low sat fat diet in detail  Continues simvastatin         Relevant Orders   CT CARDIAC SCORING (SELF PAY ONLY)

## 2023-05-14 ENCOUNTER — Other Ambulatory Visit: Payer: Managed Care, Other (non HMO)

## 2023-05-20 ENCOUNTER — Encounter: Payer: Managed Care, Other (non HMO) | Admitting: Family Medicine

## 2023-06-15 ENCOUNTER — Ambulatory Visit (HOSPITAL_COMMUNITY)
Admission: RE | Admit: 2023-06-15 | Discharge: 2023-06-15 | Disposition: A | Discharge status: Home / Self Care | Payer: Managed Care, Other (non HMO) | Source: Ambulatory Visit | Attending: Family Medicine | Admitting: Family Medicine

## 2023-06-15 DIAGNOSIS — E7800 Pure hypercholesterolemia, unspecified: Secondary | ICD-10-CM | POA: Insufficient documentation

## 2023-06-16 ENCOUNTER — Encounter: Payer: Self-pay | Admitting: Family Medicine

## 2023-06-16 DIAGNOSIS — I10 Essential (primary) hypertension: Secondary | ICD-10-CM

## 2023-06-16 DIAGNOSIS — R931 Abnormal findings on diagnostic imaging of heart and coronary circulation: Secondary | ICD-10-CM

## 2023-06-16 DIAGNOSIS — E7800 Pure hypercholesterolemia, unspecified: Secondary | ICD-10-CM

## 2023-06-17 DIAGNOSIS — R931 Abnormal findings on diagnostic imaging of heart and coronary circulation: Secondary | ICD-10-CM | POA: Insufficient documentation

## 2023-06-17 MED ORDER — ROSUVASTATIN CALCIUM 10 MG PO TABS: 10.0000 mg | ORAL_TABLET | Freq: Every day | ORAL | 3 refills | Status: DC

## 2023-06-18 NOTE — Telephone Encounter (Signed)
Please abstract imms Thanks

## 2023-06-18 NOTE — Telephone Encounter (Signed)
Imm updated

## 2023-07-01 ENCOUNTER — Encounter: Payer: Self-pay | Admitting: Family Medicine

## 2023-07-08 ENCOUNTER — Encounter: Payer: Self-pay | Admitting: Family Medicine

## 2023-08-02 ENCOUNTER — Encounter: Payer: Self-pay | Admitting: Family Medicine

## 2023-08-03 ENCOUNTER — Telehealth: Payer: Self-pay | Admitting: Family Medicine

## 2023-08-03 DIAGNOSIS — E7800 Pure hypercholesterolemia, unspecified: Secondary | ICD-10-CM

## 2023-08-03 NOTE — Telephone Encounter (Signed)
-----   Message from Alvina Chou sent at 07/23/2023 12:49 PM EST ----- Regarding: Lab orders for Jeffrey Hartman, 12.5.24 Lab orders, thanks

## 2023-08-06 ENCOUNTER — Other Ambulatory Visit (INDEPENDENT_AMBULATORY_CARE_PROVIDER_SITE_OTHER): Payer: Managed Care, Other (non HMO)

## 2023-08-06 DIAGNOSIS — E7800 Pure hypercholesterolemia, unspecified: Secondary | ICD-10-CM | POA: Diagnosis not present

## 2023-08-06 LAB — LIPID PANEL
Cholesterol: 153 mg/dL (ref 0–200)
HDL: 33.3 mg/dL — ABNORMAL LOW (ref >39.00)
LDL Cholesterol: 91 mg/dL (ref 0–99)
NonHDL: 119.31
Total CHOL/HDL Ratio: 5
Triglycerides: 144 mg/dL (ref 0.0–149.0)
VLDL: 28.8 mg/dL (ref 0.0–40.0)

## 2023-08-06 LAB — AST: AST: 25 U/L (ref 0–37)

## 2023-08-06 LAB — ALT: ALT: 33 U/L (ref 0–53)

## 2023-08-06 MED ORDER — ROSUVASTATIN CALCIUM 20 MG PO TABS: 20.0000 mg | ORAL_TABLET | Freq: Every day | ORAL | 0 refills | Status: DC

## 2023-08-10 MED ORDER — LOTREL 5-10 MG PO CAPS: 1.0000 | ORAL_CAPSULE | Freq: Every day | ORAL | 1 refills | Status: DC

## 2023-08-10 NOTE — Addendum Note (Signed)
Addended by: Shon Millet on: 08/10/2023 08:12 AM   Modules accepted: Orders

## 2023-08-21 ENCOUNTER — Encounter: Payer: Self-pay | Admitting: Cardiology

## 2023-08-21 ENCOUNTER — Other Ambulatory Visit: Payer: Self-pay | Admitting: *Deleted

## 2023-08-21 ENCOUNTER — Ambulatory Visit: Payer: Managed Care, Other (non HMO) | Attending: Cardiology | Admitting: Cardiology

## 2023-08-21 VITALS — BP 128/86 | HR 72 | Ht 70.0 in | Wt 295.0 lb

## 2023-08-21 DIAGNOSIS — E7800 Pure hypercholesterolemia, unspecified: Secondary | ICD-10-CM | POA: Diagnosis not present

## 2023-08-21 DIAGNOSIS — R931 Abnormal findings on diagnostic imaging of heart and coronary circulation: Secondary | ICD-10-CM | POA: Diagnosis not present

## 2023-08-21 DIAGNOSIS — I10 Essential (primary) hypertension: Secondary | ICD-10-CM

## 2023-08-21 DIAGNOSIS — Z01812 Encounter for preprocedural laboratory examination: Secondary | ICD-10-CM

## 2023-08-21 DIAGNOSIS — R9431 Abnormal electrocardiogram [ECG] [EKG]: Secondary | ICD-10-CM

## 2023-08-21 MED ORDER — METOPROLOL TARTRATE 100 MG PO TABS: 100.0000 mg | ORAL_TABLET | ORAL | 0 refills | Status: AC

## 2023-08-21 NOTE — Patient Instructions (Signed)
Medication Instructions:  The current medical regimen is effective;  continue present plan and medications.  *If you need a refill on your cardiac medications before your next appointment, please call your pharmacy*  Lab Work: Please have blood work today at your closest [employer]. ([facility], [facility])  If you have labs (blood work) drawn today and your tests are completely normal, you will receive your results only by: MyChart Message (if you have MyChart) OR A paper copy in the mail If you have any lab test that is abnormal or we need to change your treatment, we will call you to review the results.   Testing/Procedures: Your physician has requested that you have an echocardiogram. Echocardiography is a painless test that uses sound waves to create images of your heart. It provides your doctor with information about the size and shape of your heart and how well your heart's chambers and valves are working. This procedure takes approximately one hour. There are no restrictions for this procedure. Please do NOT wear cologne, perfume, aftershave, or lotions (deodorant is allowed). Please arrive 15 minutes prior to your appointment time.  Please note: We ask at that you not bring children with you during ultrasound (echo/ vascular) testing. Due to room size and safety concerns, children are not allowed in the ultrasound rooms during exams. Our front office staff cannot provide observation of children in our lobby area while testing is being conducted. An adult accompanying a patient to their appointment will only be allowed in the ultrasound room at the discretion of the ultrasound technician under special circumstances. We apologize for any inconvenience.    Your cardiac CT will be scheduled at:   [facility] [address] [place], [place] [address] [phone]  Please arrive at the [facility] and Children's Entrance (Entrance C2) of [facility] 30 minutes prior to test start  time. You can use the FREE valet parking offered at entrance C (encouraged to control the heart rate for the test)  Proceed to the [facility] Radiology Department (first floor) to check-in and test prep.  All radiology patients and guests should use entrance C2 at [facility], accessed from [facility], even though the hospital's physical address listed is [address].     There is spacious parking and easy access to the radiology department from the [facility] Heart and Vascular entrance. Please enter here and check-in with the desk attendant.   Please follow these instructions carefully (unless otherwise directed):  An IV will be required for this test and Nitroglycerin will be given.  Hold all erectile dysfunction medications at least 3 days (72 hrs) prior to test. (Ie viagra, cialis, sildenafil, tadalafil, etc)   On the Night Before the Test: Be sure to Drink plenty of water. Do not consume any caffeinated/decaffeinated beverages or chocolate 12 hours prior to your test. Do not take any antihistamines 12 hours prior to your test.  On the Day of the Test: Drink plenty of water until 1 hour prior to the test. Do not eat any food 1 hour prior to test. You may take your regular medications prior to the test.  Take metoprolol (Lopressor) two hours prior to test. If you take Furosemide/Hydrochlorothiazide/Spironolactone/Chlorthalidone, please HOLD on the morning of the test. Patients who wear a continuous glucose monitor MUST remove the device prior to scanning.      After the Test: Drink plenty of water. After receiving IV contrast, you may experience a mild flushed feeling.  This is normal. On occasion, you may experience a mild rash up to 24 hours after the test. This is not dangerous. If this occurs, you can take Benadryl 25 mg and increase your fluid intake. If you experience trouble breathing, this can be serious. If it is severe call 911 IMMEDIATELY. If  it is mild, please call our office.  We will call to schedule your test 2-4 weeks out understanding that some insurance companies will need an authorization prior to the service being performed.   For more information and frequently asked questions, please visit our website : [email or web address]  For non-scheduling related questions, please contact the cardiac imaging nurse navigator should you have any questions/concerns: Cardiac Imaging Nurse Navigators Direct Office Dial: [phone]   For scheduling needs, including cancellations and rescheduling, please call [place], [phone].   Follow-Up: At [facility], you and your health needs are our priority.  As part of our continuing mission to provide you with exceptional heart care, we have created designated Provider Care Teams.  These Care Teams include your primary Cardiologist (physician) and Advanced Practice Providers (APPs -  Physician Assistants and Nurse Practitioners) who all work together to provide you with the care you need, when you need it.  We recommend signing up for the patient portal called "MyChart".  Sign up information is provided on this After Visit Summary.  MyChart is used to connect with patients for Virtual Visits (Telemedicine).  Patients are able to view lab/test results, encounter notes, upcoming appointments, etc.  Non-urgent messages can be sent to your provider as well.   To learn more about what you can do with MyChart, go to [email or web address].    Your next appointment:   Follow up will be based on the results of the above testing.

## 2023-08-21 NOTE — Progress Notes (Signed)
Cardiology Office Note:  .   Date:  08/21/2023  ID:  Jeffrey Hartman, DOB 1961/01/29, MRN [id number] PCP: Judy Pimple, MD  [facility] Providers Cardiologist:  Donato Schultz, MD     History of Present Illness: .   Jeffrey Hartman is a 62 y.o. male Discussed with the use of AI scribe   History of Present Illness   The patient, a 62 year old male, presents for evaluation of an elevated coronary calcium score of 501, placing him in the 91st percentile. The majority of the calcification was found in the right coronary artery (400), with lesser amounts in the proximal LAD (38) and left circumflex (62). The patient's LDL was 112, triglycerides were 144, and creatinine was 1.06.    An EKG showed a sinus rhythm of 72 beats per minute with a right bundle branch block.  The patient denies any chest pain or significant shortness of breath. However, he reported a fainting episode earlier in the year, which was attributed to a vasovagal response. The patient has a family history of heart disease, with his father having had a heart attack and open heart surgery in his mid-sixties. The patient has been taking a low dose aspirin for over 20 years due to a family history of colon cancer.  The patient is a non-smoker and does not consume alcohol. He is interested in proactive measures to prevent a heart attack, even if it means the placement of a stent. The patient is currently on a regimen of Crestor and aspirin for cholesterol management and heart disease prevention, respectively.           Studies Reviewed: Marland Kitchen   EKG Interpretation Date/Time:  Friday August 21 2023 09:06:36 EST Ventricular Rate:  72 PR Interval:  160 QRS Duration:  128 QT Interval:  396 QTC Calculation: 433 R Axis:   66  Text Interpretation: Normal sinus rhythm Right bundle branch block No previous ECGs available Confirmed by Donato Schultz ([id number]) on 08/21/2023 9:17:12 AM    Results   LABS LDL: 91  (07/27/2023) Triglycerides: 144 (07/27/2023) Creatinine: 1.06 (07/27/2023) ALT: 30 (07/27/2023)  RADIOLOGY Coronary calcium score: 501, 91st percentile Right coronary artery calcium score: 400 LAD calcium score: 38 Left circumflex calcium score: 62  DIAGNOSTIC EKG: Sinus rhythm, 72 bpm, right bundle branch block (08/21/2023)     Risk Assessment/Calculations:            Physical Exam:   VS:  BP 128/86   Pulse 72   Ht 5\' 10"  (1.778 m)   Wt 295 lb (133.8 kg)   SpO2 96%   BMI 42.33 kg/m    Wt Readings from Last 3 Encounters:  08/21/23 295 lb (133.8 kg)  05/05/23 271 lb (122.9 kg)  04/21/23 272 lb (123.4 kg)    GEN: Well nourished, well developed in no acute distress NECK: No JVD; No carotid bruits CARDIAC: RRR, no murmurs, no rubs, no gallops RESPIRATORY:  Clear to auscultation without rales, wheezing or rhonchi  ABDOMEN: Soft, non-tender, non-distended EXTREMITIES:  No edema; No deformity   ASSESSMENT AND PLAN: .    Assessment and Plan    Coronary Artery Disease (CAD) Elevated coronary calcium score of 501 (91st percentile). Significant calcification in RCA (400), LAD (38), and LCx (62). No current symptoms. Family history of heart disease. LDL is 91. EKG shows sinus rhythm with RBBB. Goal: reduce LDL to <70 to lower inflammation and stabilize plaque. Discussed higher calcium scores' association with increased heart event risk.  Explained Crestor's potency and safety over simvastatin, and benefits of aggressive cholesterol management. Recommended Mediterranean diet and blood pressure control. Coronary CT scan with contrast to assess lumen and blood flow, and echocardiogram to evaluate heart function and valves. - Transition from simvastatin to Crestor 20 mg - Continue low-dose aspirin - Recommend Mediterranean diet - Order coronary CT scan with contrast - Order echocardiogram - Add LP(a) test to next blood work - PRN follow-up  Right Bundle Branch Block (RBBB) EKG  shows sinus rhythm with RBBB. No current symptoms. Discussed that RBBB is an electrical change and does not always worsen prognosis. - Order echocardiogram to assess heart function and valves (heart murmur as a Jeffrey)  General Health Maintenance Discussed the importance of maintaining a healthy lifestyle, including diet and exercise (wife present). - Recommend Mediterranean diet - Continue low-dose aspirin  Follow-up - PRN follow-up after coronary CT scan and echocardiogram results.               Signed, Donato Schultz, MD

## 2023-08-22 ENCOUNTER — Encounter: Payer: Self-pay | Admitting: Cardiology

## 2023-08-25 LAB — BASIC METABOLIC PANEL
BUN/Creatinine Ratio: 17 (ref 10–24)
BUN: 19 mg/dL (ref 8–27)
CO2: 22 mmol/L (ref 20–29)
Calcium: 9.9 mg/dL (ref 8.6–10.2)
Chloride: 104 mmol/L (ref 96–106)
Creatinine, Ser: 1.11 mg/dL (ref 0.76–1.27)
Glucose: 89 mg/dL (ref 70–99)
Potassium: 4.4 mmol/L (ref 3.5–5.2)
Sodium: 141 mmol/L (ref 134–144)
eGFR: 76 mL/min/{1.73_m2} (ref >59)

## 2023-08-25 LAB — LIPOPROTEIN A (LPA): Lipoprotein (a): 23.2 nmol/L (ref <75.0)

## 2023-08-27 ENCOUNTER — Encounter: Payer: Self-pay | Admitting: Cardiology

## 2023-08-27 DIAGNOSIS — E7800 Pure hypercholesterolemia, unspecified: Secondary | ICD-10-CM

## 2023-08-27 DIAGNOSIS — R931 Abnormal findings on diagnostic imaging of heart and coronary circulation: Secondary | ICD-10-CM

## 2023-08-27 DIAGNOSIS — I10 Essential (primary) hypertension: Secondary | ICD-10-CM

## 2023-09-11 ENCOUNTER — Other Ambulatory Visit (HOSPITAL_COMMUNITY): Payer: Managed Care, Other (non HMO)

## 2023-09-13 ENCOUNTER — Encounter: Payer: Self-pay | Admitting: Family Medicine

## 2023-09-14 ENCOUNTER — Telehealth: Payer: Self-pay | Admitting: Family Medicine

## 2023-09-14 DIAGNOSIS — E7800 Pure hypercholesterolemia, unspecified: Secondary | ICD-10-CM

## 2023-09-14 NOTE — Telephone Encounter (Signed)
-----   Message from Alvina Chou sent at 08/31/2023  4:04 PM EST ----- Regarding: lab orders for [place], 1.16.25 Lab orders, thanks

## 2023-09-17 ENCOUNTER — Encounter: Payer: Self-pay | Admitting: Family Medicine

## 2023-09-17 ENCOUNTER — Other Ambulatory Visit (INDEPENDENT_AMBULATORY_CARE_PROVIDER_SITE_OTHER): Payer: Managed Care, Other (non HMO)

## 2023-09-17 DIAGNOSIS — E7800 Pure hypercholesterolemia, unspecified: Secondary | ICD-10-CM

## 2023-09-17 LAB — LIPID PANEL
Cholesterol: 142 mg/dL (ref 0–200)
HDL: 36.2 mg/dL — ABNORMAL LOW (ref >39.00)
LDL Cholesterol: 80 mg/dL (ref 0–99)
NonHDL: 106.06
Total CHOL/HDL Ratio: 4
Triglycerides: 129 mg/dL (ref 0.0–149.0)
VLDL: 25.8 mg/dL (ref 0.0–40.0)

## 2023-09-17 LAB — ALT: ALT: 34 U/L (ref 0–53)

## 2023-09-17 LAB — AST: AST: 24 U/L (ref 0–37)

## 2023-09-18 ENCOUNTER — Encounter: Payer: Self-pay | Admitting: Cardiology

## 2023-09-22 NOTE — Telephone Encounter (Signed)
Pam Please set up for VASCUSCREEN (3 tests) Thanks Donato Schultz, MD

## 2023-09-23 ENCOUNTER — Encounter (HOSPITAL_COMMUNITY): Payer: Self-pay

## 2023-09-25 ENCOUNTER — Other Ambulatory Visit: Payer: Self-pay | Admitting: Cardiology

## 2023-09-25 ENCOUNTER — Ambulatory Visit (HOSPITAL_COMMUNITY)
Admission: RE | Admit: 2023-09-25 | Discharge: 2023-09-25 | Disposition: A | Discharge status: Home / Self Care | Payer: Managed Care, Other (non HMO) | Source: Ambulatory Visit | Attending: Cardiology | Admitting: Cardiology

## 2023-09-25 ENCOUNTER — Ambulatory Visit (HOSPITAL_BASED_OUTPATIENT_CLINIC_OR_DEPARTMENT_OTHER): Payer: Managed Care, Other (non HMO)

## 2023-09-25 ENCOUNTER — Ambulatory Visit (HOSPITAL_BASED_OUTPATIENT_CLINIC_OR_DEPARTMENT_OTHER)
Admission: RE | Admit: 2023-09-25 | Discharge: 2023-09-25 | Disposition: A | Discharge status: Home / Self Care | Payer: Managed Care, Other (non HMO) | Source: Ambulatory Visit | Attending: Cardiology

## 2023-09-25 DIAGNOSIS — R9431 Abnormal electrocardiogram [ECG] [EKG]: Secondary | ICD-10-CM

## 2023-09-25 DIAGNOSIS — R931 Abnormal findings on diagnostic imaging of heart and coronary circulation: Secondary | ICD-10-CM

## 2023-09-25 DIAGNOSIS — I2510 Atherosclerotic heart disease of native coronary artery without angina pectoris: Secondary | ICD-10-CM | POA: Diagnosis not present

## 2023-09-25 LAB — ECHOCARDIOGRAM COMPLETE
Area-P 1/2: 3.48 cm2
Calc EF: 50.3 %
S' Lateral: 2.92 cm
Single Plane A2C EF: 53.7 %
Single Plane A4C EF: 46.5 %

## 2023-09-25 MED ORDER — NITROGLYCERIN 0.4 MG SL SUBL
SUBLINGUAL_TABLET | SUBLINGUAL | Status: AC
  Filled 2023-09-25: qty 2

## 2023-09-25 MED ORDER — IOHEXOL 350 MG/ML SOLN
95.0000 mL | Freq: Once | INTRAVENOUS | Status: AC | PRN
  Administered 2023-09-25: 95 mL via INTRAVENOUS

## 2023-09-25 MED ORDER — NITROGLYCERIN 0.4 MG SL SUBL
0.8000 mg | SUBLINGUAL_TABLET | Freq: Once | SUBLINGUAL | Status: AC
  Administered 2023-09-25: 0.8 mg via SUBLINGUAL

## 2023-09-30 ENCOUNTER — Encounter: Payer: Self-pay | Admitting: Cardiology

## 2023-10-05 ENCOUNTER — Encounter (HOSPITAL_BASED_OUTPATIENT_CLINIC_OR_DEPARTMENT_OTHER): Payer: Self-pay | Admitting: Pulmonary Disease

## 2023-10-05 ENCOUNTER — Ambulatory Visit (INDEPENDENT_AMBULATORY_CARE_PROVIDER_SITE_OTHER): Payer: Managed Care, Other (non HMO) | Admitting: Pulmonary Disease

## 2023-10-05 VITALS — BP 124/88 | HR 101 | Ht 70.0 in | Wt 295.7 lb

## 2023-10-05 DIAGNOSIS — R911 Solitary pulmonary nodule: Secondary | ICD-10-CM | POA: Diagnosis not present

## 2023-10-05 DIAGNOSIS — G4733 Obstructive sleep apnea (adult) (pediatric): Secondary | ICD-10-CM

## 2023-10-05 NOTE — Patient Instructions (Signed)
CPAP is working well  X CT chest wo con to follow up on nodule in July 2025

## 2023-10-05 NOTE — Progress Notes (Addendum)
Subjective:    Patient ID: Jeffrey Hartman, male    DOB: 1961-06-03, 63 y.o.   MRN: [id number]  HPI  63 yo for follow-up for OSA   Discussed the use of AI scribe software for clinical note transcription with the patient, who gave verbal consent to proceed.  History of Present Illness   The patient, with a history of sleep apnea managed with CPAP therapy, presents for a yearly follow-up. He reports consistent use of his CPAP machine every night for over twenty years, with good results and low event numbers. The patient also discusses his struggle with weight gain. He had previously lost significant weight while on Mounjaro, but after discontinuation due to insurance issues, he regained weight. The patient expresses frustration with this, as he was making progress towards his weight loss goals.  In addition to his sleep apnea and weight concerns, the patient mentions a spot on his right lung that was discovered during a CAC scan. The spot, described as a ground glass nodule in the lingula, measures approximately eight millimeters. The patient reports no history of smoking and no known family history of lung cancer. He is scheduled for a follow-up scan in six months to monitor the spot.     CPAP download shows excellent control of events 11 cm with good compliance more than 9 hours per night without a single missed night.  He is very compliant.  CPAP is certainly helped improve his daytime somnolence and fatigue  Significant tests/ events reviewed   NPSG 2000:  AHI 15/hr.   CT: Ground glass nodule in the lingula, 8 mm (06/2023) CT: Ground glass nodule in the lingula, 8 mm, stable (09/2023)  Review of Systems neg for any significant sore throat, dysphagia, itching, sneezing, nasal congestion or excess/ purulent secretions, fever, chills, sweats, unintended wt loss, pleuritic or exertional cp, hempoptysis, orthopnea pnd or change in chronic leg swelling. Also denies presyncope, palpitations,  heartburn, abdominal pain, nausea, vomiting, diarrhea or change in bowel or urinary habits, dysuria,hematuria, rash, arthralgias, visual complaints, headache, numbness weakness or ataxia.     Objective:   Physical Exam  Gen. Pleasant, obese, in no distress ENT - no lesions, no post nasal drip Neck: No JVD, no thyromegaly, no carotid bruits Lungs: no use of accessory muscles, no dullness to percussion, decreased without rales or rhonchi  Cardiovascular: Rhythm regular, heart sounds  normal, no murmurs or gallops, no peripheral edema Musculoskeletal: No deformities, no cyanosis or clubbing , no tremors       Assessment & Plan:    Assessment and Plan    Obstructive Sleep Apnea Long-standing obstructive sleep apnea managed with CPAP at 11 cm H2O. Reports excellent compliance with nightly use, good seal, and low event rate. CPAP therapy remains effective. Discussed alternative treatments, including hypoglossal nerve stimulation, which is less effective in patients with higher BMI due to lateral airway collapse. Patient prefers to continue CPAP therapy. - Continue CPAP therapy at 11 cm H2O - Annual follow-up  Weight Gain Experienced significant weight loss on Mounjaro but regained weight after discontinuation due to insurance issues. Discussed potential use of Ozempic or Zepbound, now approved for sleep apnea, to aid in weight loss. Significant weight loss can improve sleep apnea. Patient prefers medication over surgical options like gastric bypass. - Discuss with primary care physician (Dr. Milinda Antis) about prescribing Ozempic or Zepbound for weight loss in the context of sleep apnea  Pulmonary Nodule 8 mm ground-glass nodule in the right lung (lingula) identified  on CAC scan. No smoking history. Nodule stable in size on January scan. Differential includes infection or inflammation. Low risk for malignancy given stability and history. Explained that stable or decreasing size is reassuring;  growth may require further investigation, including biopsy. - Schedule follow-up CT scan in July 2025 - Follow-up appointment after CT scan to review results  General Health Maintenance Under cardiologist care for CAD and on Crestor. No other specific health maintenance issues discussed. - Continue current medications as prescribed by cardiologist  Follow-up - Annual follow-up for sleep apnea - Follow-up appointment after July CT scan to review pulmonary nodule.

## 2023-10-06 MED ORDER — TIRZEPATIDE-WEIGHT MANAGEMENT 5 MG/0.5ML ~~LOC~~ SOLN: 5.0000 mg | SUBCUTANEOUS | 0 refills | Status: DC

## 2023-10-07 NOTE — Telephone Encounter (Signed)
 For prior auth staff-he not only has obesity but also sleep apnea  Thanks

## 2023-10-08 ENCOUNTER — Telehealth: Payer: Self-pay

## 2023-10-08 ENCOUNTER — Other Ambulatory Visit (HOSPITAL_COMMUNITY): Payer: Self-pay

## 2023-10-08 NOTE — Telephone Encounter (Signed)
 He lost on the medicine from 299 to 262 lb  Now has gained much of it back

## 2023-10-08 NOTE — Telephone Encounter (Signed)
 Back up to 295 lb now

## 2023-10-08 NOTE — Telephone Encounter (Signed)
 Pharmacy Patient Advocate Encounter   Received notification from Patient Advice Request messages that prior authorization for Zepbound  5MG /0.5ML pen-injectors is required/requested.   Insurance verification completed.   The patient is insured through [facility] .   Per test claim: PA required; PA started via CoverMyMeds. KEY [id number] . Please see clinical question(s) below that I am not finding the answer to in her chart and advise.    This has been asked to clinical care team in original request encounter.

## 2023-10-08 NOTE — Telephone Encounter (Signed)
 Please see below question, as far as I can tell, pt's weight has gone up since medication was prescribed, please advise  "Has the pt lose at least 5% of baseline body weight OR has the patient continued to maintain their initial 5% weight loss?"

## 2023-10-09 ENCOUNTER — Ambulatory Visit (HOSPITAL_COMMUNITY)
Admission: RE | Admit: 2023-10-09 | Discharge: 2023-10-09 | Disposition: A | Discharge status: Home / Self Care | Payer: Managed Care, Other (non HMO) | Source: Ambulatory Visit | Attending: Cardiology | Admitting: Cardiology

## 2023-10-09 ENCOUNTER — Inpatient Hospital Stay (HOSPITAL_COMMUNITY): Admission: RE | Admit: 2023-10-09 | Payer: Managed Care, Other (non HMO) | Source: Ambulatory Visit

## 2023-10-09 DIAGNOSIS — R931 Abnormal findings on diagnostic imaging of heart and coronary circulation: Secondary | ICD-10-CM

## 2023-10-09 DIAGNOSIS — E7800 Pure hypercholesterolemia, unspecified: Secondary | ICD-10-CM

## 2023-10-09 DIAGNOSIS — I10 Essential (primary) hypertension: Secondary | ICD-10-CM

## 2023-10-11 ENCOUNTER — Encounter: Payer: Self-pay | Admitting: Cardiology

## 2023-10-16 NOTE — Telephone Encounter (Signed)
Is this a restart of the medication or did the pt gain the weight back while still on the medication? This will be important to insurance, thank you.

## 2023-10-18 NOTE — Telephone Encounter (Signed)
 She gained weight while off medicine  Trying to get back on it to help weight loss again  Thanks

## 2023-10-22 ENCOUNTER — Other Ambulatory Visit: Payer: Self-pay | Admitting: Family Medicine

## 2023-10-30 ENCOUNTER — Telehealth: Payer: Self-pay

## 2023-10-30 ENCOUNTER — Other Ambulatory Visit (HOSPITAL_COMMUNITY): Payer: Self-pay

## 2023-10-30 NOTE — Telephone Encounter (Signed)
 PA request has been Approved. New Encounter created for follow up. For additional info see Pharmacy Prior Auth telephone encounter from 10/30/2023.

## 2023-10-30 NOTE — Telephone Encounter (Signed)
 Pharmacy Patient Advocate Encounter   Received notification from Patient Advice Request messages that prior authorization for Zepbound is required/requested.   Insurance verification completed.   The patient is insured through [facility] .   Per test claim: PA required and submitted KEY/EOC/Request #: [id number] APPROVED from 10/30/2023 to 06/26/2024. Ran test claim, Copay is $376.41 WITH MANUFACTURER COUPON. This test claim was processed through [facility]- copay amounts may vary at other pharmacies due to pharmacy/plan contracts, or as the patient moves through the different stages of their insurance plan.

## 2023-11-24 ENCOUNTER — Encounter (HOSPITAL_COMMUNITY): Payer: Self-pay | Admitting: Cardiology

## 2024-01-17 ENCOUNTER — Other Ambulatory Visit: Payer: Self-pay | Admitting: Family Medicine

## 2024-02-10 ENCOUNTER — Encounter: Payer: Self-pay | Admitting: Family Medicine

## 2024-02-10 ENCOUNTER — Encounter: Payer: Self-pay | Admitting: Cardiology

## 2024-03-09 ENCOUNTER — Other Ambulatory Visit

## 2024-03-09 ENCOUNTER — Ambulatory Visit
Admission: RE | Admit: 2024-03-09 | Discharge: 2024-03-09 | Disposition: A | Discharge status: Home / Self Care | Source: Ambulatory Visit | Attending: Pulmonary Disease | Admitting: Pulmonary Disease

## 2024-03-09 DIAGNOSIS — R911 Solitary pulmonary nodule: Secondary | ICD-10-CM

## 2024-03-14 ENCOUNTER — Ambulatory Visit: Payer: Self-pay | Admitting: Pulmonary Disease

## 2024-04-02 ENCOUNTER — Other Ambulatory Visit: Payer: Self-pay | Admitting: Family Medicine

## 2024-04-11 ENCOUNTER — Encounter: Payer: Self-pay | Admitting: Family Medicine

## 2024-04-11 ENCOUNTER — Telehealth: Payer: Self-pay | Admitting: Family Medicine

## 2024-04-11 DIAGNOSIS — I10 Essential (primary) hypertension: Secondary | ICD-10-CM

## 2024-04-11 DIAGNOSIS — R7303 Prediabetes: Secondary | ICD-10-CM

## 2024-04-11 DIAGNOSIS — Z125 Encounter for screening for malignant neoplasm of prostate: Secondary | ICD-10-CM

## 2024-04-11 DIAGNOSIS — E7800 Pure hypercholesterolemia, unspecified: Secondary | ICD-10-CM

## 2024-04-11 NOTE — Telephone Encounter (Signed)
-----   Message from Veva JINNY Ferrari sent at 03/25/2024  3:11 PM EDT ----- Regarding: Lab orders for [facility], 8.15.25 Patient is scheduled for CPX labs, please order future labs, Thanks , Veva

## 2024-04-15 ENCOUNTER — Ambulatory Visit: Payer: Self-pay | Admitting: Family Medicine

## 2024-04-15 ENCOUNTER — Other Ambulatory Visit (INDEPENDENT_AMBULATORY_CARE_PROVIDER_SITE_OTHER): Payer: Managed Care, Other (non HMO)

## 2024-04-15 DIAGNOSIS — I10 Essential (primary) hypertension: Secondary | ICD-10-CM | POA: Diagnosis not present

## 2024-04-15 DIAGNOSIS — Z125 Encounter for screening for malignant neoplasm of prostate: Secondary | ICD-10-CM

## 2024-04-15 DIAGNOSIS — E7800 Pure hypercholesterolemia, unspecified: Secondary | ICD-10-CM

## 2024-04-15 DIAGNOSIS — R7303 Prediabetes: Secondary | ICD-10-CM | POA: Diagnosis not present

## 2024-04-15 LAB — COMPREHENSIVE METABOLIC PANEL WITH GFR
ALT: 32 U/L (ref 0–53)
AST: 22 U/L (ref 0–37)
Albumin: 4.3 g/dL (ref 3.5–5.2)
Alkaline Phosphatase: 63 U/L (ref 39–117)
BUN: 16 mg/dL (ref 6–23)
CO2: 25 meq/L (ref 19–32)
Calcium: 8.9 mg/dL (ref 8.4–10.5)
Chloride: 105 meq/L (ref 96–112)
Creatinine, Ser: 1.06 mg/dL (ref 0.40–1.50)
GFR: 75.89 mL/min (ref >60.00)
Glucose, Bld: 82 mg/dL (ref 70–99)
Potassium: 4.4 meq/L (ref 3.5–5.1)
Sodium: 140 meq/L (ref 135–145)
Total Bilirubin: 0.4 mg/dL (ref 0.2–1.2)
Total Protein: 6.9 g/dL (ref 6.0–8.3)

## 2024-04-15 LAB — CBC WITH DIFFERENTIAL/PLATELET
Basophils Absolute: 0 K/uL (ref 0.0–0.1)
Basophils Relative: 0.7 % (ref 0.0–3.0)
Eosinophils Absolute: 0.3 K/uL (ref 0.0–0.7)
Eosinophils Relative: 4.6 % (ref 0.0–5.0)
HCT: 41 % (ref 39.0–52.0)
Hemoglobin: 13.7 g/dL (ref 13.0–17.0)
Lymphocytes Relative: 23.8 % (ref 12.0–46.0)
Lymphs Abs: 1.5 K/uL (ref 0.7–4.0)
MCHC: 33.5 g/dL (ref 30.0–36.0)
MCV: 87.4 fl (ref 78.0–100.0)
Monocytes Absolute: 0.6 K/uL (ref 0.1–1.0)
Monocytes Relative: 8.6 % (ref 3.0–12.0)
Neutro Abs: 4 K/uL (ref 1.4–7.7)
Neutrophils Relative %: 62.3 % (ref 43.0–77.0)
Platelets: 244 K/uL (ref 150.0–400.0)
RBC: 4.69 Mil/uL (ref 4.22–5.81)
RDW: 14.1 % (ref 11.5–15.5)
WBC: 6.5 K/uL (ref 4.0–10.5)

## 2024-04-15 LAB — LIPID PANEL
Cholesterol: 125 mg/dL (ref 0–200)
HDL: 34.9 mg/dL — ABNORMAL LOW (ref >39.00)
LDL Cholesterol: 61 mg/dL (ref 0–99)
NonHDL: 90.4
Total CHOL/HDL Ratio: 4
Triglycerides: 146 mg/dL (ref 0.0–149.0)
VLDL: 29.2 mg/dL (ref 0.0–40.0)

## 2024-04-15 LAB — TSH: TSH: 1.79 u[IU]/mL (ref 0.35–5.50)

## 2024-04-15 LAB — HEMOGLOBIN A1C: Hgb A1c MFr Bld: 6.1 % (ref 4.6–6.5)

## 2024-04-15 LAB — PSA: PSA: 0.84 ng/mL (ref 0.10–4.00)

## 2024-04-19 ENCOUNTER — Encounter: Payer: Self-pay | Admitting: Cardiology

## 2024-04-19 ENCOUNTER — Other Ambulatory Visit: Payer: Self-pay | Admitting: Family Medicine

## 2024-04-22 ENCOUNTER — Encounter: Payer: Self-pay | Admitting: Family Medicine

## 2024-04-22 ENCOUNTER — Ambulatory Visit (INDEPENDENT_AMBULATORY_CARE_PROVIDER_SITE_OTHER): Payer: Managed Care, Other (non HMO) | Admitting: Family Medicine

## 2024-04-22 VITALS — BP 124/78 | HR 81 | Temp 98.8°F | Ht 70.0 in | Wt 294.4 lb

## 2024-04-22 DIAGNOSIS — Z0000 Encounter for general adult medical examination without abnormal findings: Secondary | ICD-10-CM

## 2024-04-22 DIAGNOSIS — F429 Obsessive-compulsive disorder, unspecified: Secondary | ICD-10-CM

## 2024-04-22 DIAGNOSIS — Z636 Dependent relative needing care at home: Secondary | ICD-10-CM

## 2024-04-22 DIAGNOSIS — I10 Essential (primary) hypertension: Secondary | ICD-10-CM | POA: Diagnosis not present

## 2024-04-22 DIAGNOSIS — Z125 Encounter for screening for malignant neoplasm of prostate: Secondary | ICD-10-CM

## 2024-04-22 DIAGNOSIS — R7303 Prediabetes: Secondary | ICD-10-CM | POA: Diagnosis not present

## 2024-04-22 DIAGNOSIS — E7800 Pure hypercholesterolemia, unspecified: Secondary | ICD-10-CM | POA: Diagnosis not present

## 2024-04-22 NOTE — Progress Notes (Signed)
 Subjective:    Patient ID: Jeffrey Hartman, male    DOB: Oct 01, 1960, 63 y.o.   MRN: [id number]  HPI  Here for health maintenance exam and to review chronic medical problems   Wt Readings from Last 3 Encounters:  04/22/24 294 lb 6 oz (133.5 kg)  10/05/23 295 lb 11.2 oz (134.1 kg)  08/21/23 295 lb (133.8 kg)   42.24 kg/m  Vitals:   04/22/24 1358  BP: 124/78  Pulse: 81  Temp: 98.8 F (37.1 C)  SpO2: 96%    Immunization History  Administered Date(s) Administered   Influenza Split 06/07/2012, 07/02/2017   Influenza Whole 06/02/2007, 04/28/2009, 04/23/2010, 05/03/2011   Influenza, Seasonal, Injecte, Preservative Fre 05/28/2016, 06/17/2023   Influenza,inj,Quad PF,6+ Mos 05/09/2013, 05/30/2019   Influenza-Unspecified 06/01/2014, 06/21/2015, 07/02/2018, 05/13/2021, 06/19/2022, 06/17/2023   Moderna Covid-19 Fall Seasonal Vaccine 23yrs & older 06/19/2022   PFIZER(Purple Top)SARS-COV-2 Vaccination 11/09/2019, 11/30/2019, 06/18/2020   PNEUMOCOCCAL CONJUGATE-20 06/17/2023   Pfizer Covid-19 Vaccine Bivalent Booster 39yrs & up 05/13/2021   Pfizer(Comirnaty)Fall Seasonal Vaccine 12 years and older 06/17/2023   Respiratory Syncytial Virus Vaccine,Recomb Aduvanted(Arexvy) 06/19/2022   Td 03/31/2003, 04/21/2023   Tdap 12/29/2012   Zoster Recombinant(Shingrix ) 04/15/2019, 06/21/2019   Zoster, Live 07/04/2015    Health Maintenance Due  Topic Date Due   COVID-19 Vaccine (7 - Pfizer risk 2024-25 season) 12/16/2023     Prostate health Lab Results  Component Value Date   PSA 0.84 04/15/2024   PSA 0.87 04/14/2023   PSA 0.71 04/11/2022  No change in urination     Colon cancer screening  Colonoscopy 07/2022  Due colonoscopy 07/2027  Mother had colon cancer at 52    Bone health   Falls-none  Fractures-none  Supplements -vitamin D3 with K    Exercise : Not a lot of time  Regular activity  Needs to make time     Mood    04/22/2024    2:01 PM 05/05/2023    3:50 PM  04/21/2023    2:53 PM 09/29/2022   12:03 PM 04/17/2021    4:17 PM  Depression screen PHQ 2/9  Decreased Interest 0 0 0 0 0  Down, Depressed, Hopeless 0 0 0 0 0  PHQ - 2 Score 0 0 0 0 0  Altered sleeping 0 0 0 0 1  Tired, decreased energy 0 0 0 0 0  Change in appetite 0 0 0 0 0  Feeling bad or failure about yourself  0 0 0 0 0  Trouble concentrating 0 0 0 0 0  Moving slowly or fidgety/restless 0 0 0 0 0  Suicidal thoughts 0 0 0 0 0  PHQ-9 Score 0 0 0 0 1  Difficult doing work/chores Not difficult at all Not difficult at all Not difficult at all Not difficult at all Not difficult at all   History of OCD Lexapro  20 mg daily  Does have problems getting to sleep at night  Was curious about THC products   Uses z quil for sleep   A lot of stress caring for his in laws   Was on  tirzepatide  5 mg weekly for weight  Now taking wife's left over wegovy    Started seeing a nutritionist with [facility]  Has follow up  Is a healthy weight clinic    Sees pulmonary for OSA and follow up of lung nodule    HTN bp is stable today  No cp or palpitations or headaches or edema  No side effects to  medicines  BP Readings from Last 3 Encounters:  04/22/24 124/78  10/05/23 124/88  09/25/23 104/65    Lotrel  5-10 mg daily  DAW  Lab Results  Component Value Date   NA 140 04/15/2024   K 4.4 04/15/2024   CO2 25 04/15/2024   GLUCOSE 82 04/15/2024   BUN 16 04/15/2024   CREATININE 1.06 04/15/2024   CALCIUM  8.9 04/15/2024   GFR 75.89 04/15/2024   EGFR 76 08/21/2023   GFRNONAA 68 (L) 07/11/2014   Sees cardiology for CAD Elevated CCA score to 885ile   Also mild plaque in LCA    Hyperlipidemia Lab Results  Component Value Date   CHOL 125 04/15/2024   CHOL 142 09/17/2023   CHOL 153 08/06/2023   Lab Results  Component Value Date   HDL 34.90 (L) 04/15/2024   HDL 36.20 (L) 09/17/2023   HDL 33.30 (L) 08/06/2023   Lab Results  Component Value Date   LDLCALC 61 04/15/2024   LDLCALC 80  09/17/2023   LDLCALC 91 08/06/2023   Lab Results  Component Value Date   TRIG 146.0 04/15/2024   TRIG 129.0 09/17/2023   TRIG 144.0 08/06/2023   Lab Results  Component Value Date   CHOLHDL 4 04/15/2024   CHOLHDL 4 09/17/2023   CHOLHDL 5 08/06/2023   Lab Results  Component Value Date   LDLDIRECT 109.0 09/21/2009   Crestor  20 mg daily   Prediabetes Lab Results  Component Value Date   HGBA1C 6.1 04/15/2024   HGBA1C 5.6 04/14/2023   HGBA1C 6.0 04/11/2022   Not eating better No time   Lab Results  Component Value Date   ALT 32 04/15/2024   AST 22 04/15/2024   ALKPHOS 63 04/15/2024   BILITOT 0.4 04/15/2024    Lab Results  Component Value Date   TSH 1.79 04/15/2024   Lab Results  Component Value Date   WBC 6.5 04/15/2024   HGB 13.7 04/15/2024   HCT 41.0 04/15/2024   MCV 87.4 04/15/2024   PLT 244.0 04/15/2024      Patient Active Problem List   Diagnosis Date Noted   Caregiver stress 04/24/2024   Elevated coronary artery calcium  score 06/17/2023   Light headedness 09/29/2022   Family history of colon cancer 07/21/2022   Insomnia 04/17/2021   Prediabetes 04/10/2020   Eczema 08/24/2015   Cerumen impaction 06/16/2014   Low testosterone  11/09/2013   Prostate cancer screening 11/02/2013   Adenomatous colon polyp 07/28/2012   Family history of malignant neoplasm of gastrointestinal tract 06/29/2012   Anal fissure 06/29/2012   History of colonic polyps 06/29/2012   Routine general medical examination at a health care facility 09/17/2011   Morbid obesity (HCC) 01/01/2010   Allergic rhinitis 10/13/2008   Obstructive sleep apnea 08/11/2007   Obsessive-compulsive disorder 01/21/2007   ACTINIC SKIN DAMAGE 01/21/2007   Hyperlipidemia 01/13/2007   Essential hypertension 01/13/2007   Past Medical History:  Diagnosis Date   Anxiety states    Dermatophytosis of nail    Obsessive-compulsive disorders    Obstructive sleep apnea (adult) (pediatric)    Other and  unspecified hyperlipidemia    Seasonal allergic rhinitis    Sleep apnea    Unspecified dermatitis due to sun    Unspecified essential hypertension    Unspecified sleep apnea    Past Surgical History:  Procedure Laterality Date   COLONOSCOPY  11/02, 11/05   polyps   CYSTECTOMY     cyst on hand   Social History   Tobacco  Use   Smoking status: Never   Smokeless tobacco: Never  Vaping Use   Vaping status: Never Used  Substance Use Topics   Alcohol use: Yes    Alcohol/week: 0.0 standard drinks of alcohol    Comment: occasionally,1 beer once month   Drug use: No   Family History  Problem Relation Age of Onset   Colon cancer Mother 60   Hypertension Father    Diabetes type II Father    Cancer - Other Father 14       Adrenal Cancer   Prostate cancer Neg Hx    Colon polyps Neg Hx    Esophageal cancer Neg Hx    Stomach cancer Neg Hx    Rectal cancer Neg Hx    Allergies  Allergen Reactions   Shellfish Allergy Shortness Of Breath and Other (See Comments)    Reaction=tunnel vision   Amlodipine  Besy-Benazepril  Hcl     REACTION: Generic med did not adequatly control htnh   Sertraline Hcl Nausea Only    REACTION: nausea, ED   Current Outpatient Medications on File Prior to Visit  Medication Sig Dispense Refill   albuterol  (VENTOLIN  HFA) 108 (90 Base) MCG/ACT inhaler Inhale 2 puffs into the lungs every 6 (six) hours as needed for wheezing or shortness of breath. 8 g 0   aspirin 81 MG tablet Take 81 mg by mouth daily.       Azelastine  HCl (ASTEPRO ) 0.15 % SOLN Place 1 spray into both nostrils 2 (two) times daily. 90 mL 3   Coenzyme Q10 (CO Q-10 PO) Take by mouth daily.     Docusate Sodium 100 MG capsule Take 100 mg by mouth as needed.     escitalopram  (LEXAPRO ) 20 MG tablet Take 1 tablet (20 mg total) by mouth daily. 90 tablet 3   Fexofenadine HCl (ALLEGRA PO) Take 1 tablet by mouth daily. As directed     fish oil-omega-3 fatty acids 1000 MG capsule Take 1 g by mouth daily.       fluticasone  (FLONASE ) 50 MCG/ACT nasal spray Place 2 sprays into both nostrils daily as needed. 48 g 3   folic acid (FOLVITE) 400 MCG tablet Take 400 mcg by mouth daily.     gabapentin  (NEURONTIN ) 300 MG capsule Take 1 capsule (300 mg total) by mouth at bedtime. (Patient taking differently: Take 300 mg by mouth as needed.) 90 capsule 3   hydrocortisone  (ANUSOL -HC) 25 MG suppository Place 1 suppository (25 mg total) rectally every evening. As needed for 7 days as needed for rectal pain or bleeding 14 suppository 2   LOTREL  5-10 MG capsule TAKE 1 CAPSULE DAILY 90 capsule 0   methocarbamol  (ROBAXIN ) 500 MG tablet Take 1 tablet (500 mg total) by mouth every 8 (eight) hours as needed for muscle spasms. 90 tablet 2   metoprolol  tartrate (LOPRESSOR ) 100 MG tablet Take 1 tablet (100 mg total) by mouth as directed. Take one tablet (2) hours before your CT scan 1 tablet 0   Multiple Vitamin (MULTIVITAMIN) capsule Take 1 capsule by mouth daily.     Multiple Vitamins-Minerals (PRESERVISION AREDS PO) Take by mouth in the morning and at bedtime.     OVER THE COUNTER MEDICATION Per patient taking Vitamin D3 with K2     OVER THE COUNTER MEDICATION Metamucil Fiber taking     OVER THE COUNTER MEDICATION Bio Astin 12 mg taking daily     OVER THE COUNTER MEDICATION Sleep Aid nightly     rosuvastatin  (CRESTOR )  20 MG tablet TAKE 1 TABLET DAILY 90 tablet 1   tirzepatide  5 MG/0.5ML injection vial Inject 5 mg into the skin once a week. 2 mL 0   zinc gluconate 50 MG tablet Take 50 mg by mouth every other day.     No current facility-administered medications on file prior to visit.    Review of Systems  Constitutional:  Positive for fatigue. Negative for activity change, appetite change, fever and unexpected weight change.  HENT:  Negative for congestion, rhinorrhea, sore throat and trouble swallowing.   Eyes:  Negative for pain, redness, itching and visual disturbance.  Respiratory:  Negative for cough, chest  tightness, shortness of breath and wheezing.   Cardiovascular:  Negative for chest pain and palpitations.  Gastrointestinal:  Negative for abdominal pain, blood in stool, constipation, diarrhea and nausea.  Endocrine: Negative for cold intolerance, heat intolerance, polydipsia and polyuria.  Genitourinary:  Negative for difficulty urinating, dysuria, frequency and urgency.  Musculoskeletal:  Negative for arthralgias, joint swelling and myalgias.  Skin:  Negative for pallor and rash.  Neurological:  Negative for dizziness, tremors, weakness, numbness and headaches.  Hematological:  Negative for adenopathy. Does not bruise/bleed easily.  Psychiatric/Behavioral:  Negative for decreased concentration and dysphoric mood. The patient is not nervous/anxious.        Very stressed caring for his in laws        Objective:   Physical Exam Constitutional:      General: He is not in acute distress.    Appearance: Normal appearance. He is well-developed. He is obese. He is not ill-appearing or diaphoretic.  HENT:     Head: Normocephalic and atraumatic.     Right Ear: Tympanic membrane, ear canal and external ear normal.     Left Ear: Tympanic membrane, ear canal and external ear normal.     Nose: Nose normal. No congestion.     Mouth/Throat:     Mouth: Mucous membranes are moist.     Pharynx: Oropharynx is clear. No posterior oropharyngeal erythema.  Eyes:     General: No scleral icterus.       Right eye: No discharge.        Left eye: No discharge.     Conjunctiva/sclera: Conjunctivae normal.     Pupils: Pupils are equal, round, and reactive to light.  Neck:     Thyroid : No thyromegaly.     Vascular: No carotid bruit or JVD.  Cardiovascular:     Rate and Rhythm: Normal rate and regular rhythm.     Pulses: Normal pulses.     Heart sounds: Normal heart sounds.     No gallop.  Pulmonary:     Effort: Pulmonary effort is normal. No respiratory distress.     Breath sounds: Normal breath  sounds. No wheezing or rales.     Comments: Good air exch Chest:     Chest wall: No tenderness.  Abdominal:     General: Bowel sounds are normal. There is no distension or abdominal bruit.     Palpations: Abdomen is soft. There is no mass.     Tenderness: There is no abdominal tenderness.     Hernia: No hernia is present.  Musculoskeletal:        General: No tenderness.     Cervical back: Normal range of motion and neck supple. No rigidity. No muscular tenderness.     Right lower leg: No edema.     Left lower leg: No edema.  Lymphadenopathy:  Cervical: No cervical adenopathy.  Skin:    General: Skin is warm and dry.     Coloration: Skin is not pale.     Findings: No erythema or rash.     Comments: Solar lentigines diffusely   Neurological:     Mental Status: He is alert.     Cranial Nerves: No cranial nerve deficit.     Motor: No abnormal muscle tone.     Coordination: Coordination normal.     Gait: Gait normal.     Deep Tendon Reflexes: Reflexes are normal and symmetric. Reflexes normal.  Psychiatric:        Mood and Affect: Mood normal.        Cognition and Memory: Cognition and memory normal.     Comments: Candidly discusses symptoms and stressors             Assessment & Plan:   Problem List Items Addressed This Visit       Cardiovascular and Mediastinum   Essential hypertension   bp in fair control at this time  BP Readings from Last 1 Encounters:  04/22/24 124/78   No changes needed Most recent labs reviewed  Disc lifstyle change with low sodium diet and exercise  Continues lotrel  5-10 mg daily DAW (generic does not work for him)   Encouraged weight loss /healthy habits         Other   Routine general medical examination at a health care facility - Primary   Reviewed health habits including diet and exercise and skin cancer prevention Reviewed appropriate screening tests for age  Also reviewed health mt list, fam hx and immunization status ,  as well as social and family history   See HPI Labs reviewed and ordered Health Maintenance  Topic Date Due   COVID-19 Vaccine (7 - Pfizer risk 2024-25 season) 12/16/2023   Flu Shot  11/29/2024*   Colon Cancer Screening  07/22/2027   DTaP/Tdap/Td vaccine (4 - Td or Tdap) 04/20/2033   Pneumococcal Vaccine for age over 17  Completed   Hepatitis C Screening  Completed   HIV Screening  Completed   Zoster (Shingles) Vaccine  Completed   Hepatitis B Vaccine  Aged Out   HPV Vaccine  Aged Out   Meningitis B Vaccine  Aged Out  *Topic was postponed. The date shown is not the original due date.    Work and caregiver stress do not allow time for self care currently  This is unfortunate Psa stable Discussed fall prevention, supplements and exercise for bone density  PHQ 0 Encouraged strength training if he can get time        Prostate cancer screening    Lab Results  Component Value Date   PSA 0.84 04/15/2024   PSA 0.87 04/14/2023   PSA 0.71 04/11/2022    No urinary changes      Prediabetes   Prediabetes  Lab Results  Component Value Date   HGBA1C 6.1 04/15/2024   HGBA1C 5.6 04/14/2023   HGBA1C 6.0 04/11/2022    disc imp of low glycemic diet and wt loss to prevent DM2        Obsessive-compulsive disorder   Lexapro  20 mg daily manages this well         Morbid obesity (HCC)   Tirzepatide  helped initially Now work and caregiver stress no longer allow him time to continue exercise and self care (also will have to stop due to lack of insurance coverage)  Instructed to stop this  medication if he cannot exercise due to risk of loss of muscle   Has appointment with weight management clinic through [facility] as well      Hyperlipidemia   Disc goals for lipids and reasons to control them Rev last labs with pt Rev low sat fat diet in detail  LDL 61 at goal  Low HDL-encouraged exercise Continue crestor  20 mg daily      Caregiver stress   Caring for his in laws This  has been difficult  Leaves no time for exercise / meal prep or self care  Takes lexapro  Offered counseling referral  SW referral may also be helpful

## 2024-04-22 NOTE — Patient Instructions (Addendum)
 It is not safe to continue the glp-1 medication if you do not perform strength building exercising   Optimally- exercise 5 days per week with combo of cardio and strength training  The strength training is probably more important  If you cannot find time to do this  You must stop the glp-1 medication until you can start the exercise   Add some strength training to your routine, this is important for bone and brain health and can reduce your risk of falls and help your body use insulin properly and regulate weight  Light weights, exercise bands , and internet videos are a good way to start  Yoga (chair or regular), machines , floor exercises or a gym with machines are also good options    Get your flu and covid shots this fall   Consider meditation practice for sleep  App called CALM is helpful

## 2024-04-24 DIAGNOSIS — Z636 Dependent relative needing care at home: Secondary | ICD-10-CM | POA: Insufficient documentation

## 2024-04-24 NOTE — Assessment & Plan Note (Addendum)
 Caring for his in laws This has been difficult  Leaves no time for exercise / meal prep or self care  Takes lexapro  Offered counseling referral  SW referral may also be helpful

## 2024-04-24 NOTE — Assessment & Plan Note (Signed)
 Tirzepatide  helped initially Now work and caregiver stress no longer allow him time to continue exercise and self care (also will have to stop due to lack of insurance coverage)  Instructed to stop this medication if he cannot exercise due to risk of loss of muscle   Has appointment with weight management clinic through [place] as well

## 2024-04-24 NOTE — Assessment & Plan Note (Signed)
 Disc goals for lipids and reasons to control them Rev last labs with pt Rev low sat fat diet in detail  LDL 61 at goal  Low HDL-encouraged exercise Continue crestor  20 mg daily

## 2024-04-24 NOTE — Assessment & Plan Note (Signed)
  Lab Results  Component Value Date   PSA 0.84 04/15/2024   PSA 0.87 04/14/2023   PSA 0.71 04/11/2022    No urinary changes

## 2024-04-24 NOTE — Assessment & Plan Note (Signed)
 Lexapro  20 mg daily manages this well

## 2024-04-24 NOTE — Assessment & Plan Note (Signed)
 bp in fair control at this time  BP Readings from Last 1 Encounters:  04/22/24 124/78   No changes needed Most recent labs reviewed  Disc lifstyle change with low sodium diet and exercise  Continues lotrel  5-10 mg daily DAW (generic does not work for him)   Encouraged weight loss sallee habits

## 2024-04-24 NOTE — Assessment & Plan Note (Signed)
 Reviewed health habits including diet and exercise and skin cancer prevention Reviewed appropriate screening tests for age  Also reviewed health mt list, fam hx and immunization status , as well as social and family history   See HPI Labs reviewed and ordered Health Maintenance  Topic Date Due   COVID-19 Vaccine (7 - Pfizer risk 2024-25 season) 12/16/2023   Flu Shot  11/29/2024*   Colon Cancer Screening  07/22/2027   DTaP/Tdap/Td vaccine (4 - Td or Tdap) 04/20/2033   Pneumococcal Vaccine for age over 98  Completed   Hepatitis C Screening  Completed   HIV Screening  Completed   Zoster (Shingles) Vaccine  Completed   Hepatitis B Vaccine  Aged Out   HPV Vaccine  Aged Out   Meningitis B Vaccine  Aged Out  *Topic was postponed. The date shown is not the original due date.    Work and caregiver stress do not allow time for self care currently  This is unfortunate Psa stable Discussed fall prevention, supplements and exercise for bone density  PHQ 0 Encouraged strength training if he can get time

## 2024-04-24 NOTE — Assessment & Plan Note (Signed)
 Prediabetes  Lab Results  Component Value Date   HGBA1C 6.1 04/15/2024   HGBA1C 5.6 04/14/2023   HGBA1C 6.0 04/11/2022    disc imp of low glycemic diet and wt loss to prevent DM2

## 2024-05-05 ENCOUNTER — Encounter: Payer: Self-pay | Admitting: Family Medicine

## 2024-05-17 MED ORDER — AMLODIPINE BESYLATE 5 MG PO TABS
5.0000 mg | ORAL_TABLET | Freq: Every day | ORAL | 3 refills | Status: DC
Start: 2024-05-17 — End: 2024-07-22

## 2024-05-17 MED ORDER — BENAZEPRIL HCL 10 MG PO TABS: 10.0000 mg | ORAL_TABLET | Freq: Every day | ORAL | 3 refills | Status: DC

## 2024-05-18 MED ORDER — COVID-19 MRNA VAC-TRIS(PFIZER) 30 MCG/0.3ML IM SUSY: 0.3000 mL | PREFILLED_SYRINGE | Freq: Once | INTRAMUSCULAR | 0 refills | Status: AC

## 2024-05-18 NOTE — Addendum Note (Signed)
 Addended by: RANDEEN HARDY A on: 05/18/2024 07:34 PM   Modules accepted: Orders

## 2024-05-19 ENCOUNTER — Other Ambulatory Visit: Payer: Self-pay | Admitting: Family Medicine

## 2024-05-19 NOTE — Telephone Encounter (Signed)
 Please see pharmacy's comments:  Pharmacy comment: Patient has indicated to us  that they are either allergic or sensitive to (ace inhibitors). There is possible cross-allergy. May we dispense? Please respond with appropriate changes or comment to Pharmacy. Patient has indicated to us  that they are either allergic or sensitive to (ace inhibitors). There is possible cross-allergy. May we dispense? Please respond with appropriate changes or comment to Pharmacy.

## 2024-05-20 NOTE — Telephone Encounter (Signed)
 Yes he has tolerated benazepril  in the past when combined with amlodipine  and tolerated it well Thanks  Please dispense

## 2024-05-23 NOTE — Telephone Encounter (Signed)
 Spoke with Oneil, of [facility], relaying Dr Graham message. He documented Dr Graham message and stated they will process rx for pt.

## 2024-05-30 ENCOUNTER — Other Ambulatory Visit: Payer: Self-pay | Admitting: Family Medicine

## 2024-06-02 MED ORDER — COVID-19 MRNA VAC-TRIS(PFIZER) 30 MCG/0.3ML IM SUSY: 0.3000 mL | PREFILLED_SYRINGE | Freq: Once | INTRAMUSCULAR | 0 refills | Status: AC

## 2024-06-02 NOTE — Addendum Note (Signed)
 Addended by: RANDEEN HARDY A on: 06/02/2024 07:26 PM   Modules accepted: Orders

## 2024-06-20 MED ORDER — BENAZEPRIL HCL 20 MG PO TABS: 20.0000 mg | ORAL_TABLET | Freq: Every day | ORAL | 3 refills | Status: DC

## 2024-06-20 NOTE — Addendum Note (Signed)
 Addended by: RANDEEN HARDY A on: 06/20/2024 07:38 PM   Modules accepted: Orders

## 2024-06-28 ENCOUNTER — Telehealth: Payer: Self-pay

## 2024-06-28 ENCOUNTER — Other Ambulatory Visit (HOSPITAL_COMMUNITY): Payer: Self-pay

## 2024-06-28 NOTE — Telephone Encounter (Signed)
 Pharmacy Patient Advocate Encounter   Received notification from Onbase that prior authorization for Zepbound  5 renewal is required/requested.   Insurance verification completed.   The patient is insured through [facility].   Per test claim: PA required; PA submitted to above mentioned insurance via Latent Key/confirmation #/EOC [id number] Status is pending

## 2024-06-30 ENCOUNTER — Other Ambulatory Visit (HOSPITAL_COMMUNITY): Payer: Self-pay

## 2024-06-30 NOTE — Telephone Encounter (Signed)
 Pharmacy Patient Advocate Encounter  Received notification from [facility] that Prior Authorization for Zepbound  5 renewal has been DENIED.  Full denial letter will be uploaded to the media tab. See denial reason below.     PA #/Case ID/Reference #: # [id number]

## 2024-07-01 ENCOUNTER — Other Ambulatory Visit (HOSPITAL_COMMUNITY): Payer: Self-pay

## 2024-07-03 NOTE — Telephone Encounter (Signed)
 It looks like preferred may be wegovy  now (semaglutide ) -I think he was on this before  Declines the zepbound   Is he ok with a switch ? (If they do indeed cover) ?

## 2024-07-05 NOTE — Telephone Encounter (Signed)
 The brand names ozempic  and mounjaro  are only used for diabetes   He is using medicine for obesity for co morbidities  The names used for this purpose are wegovy  and zepbound    Looks like we will have to change to wegovy  (unfortunately)  When will he run out of the zepbound  he has?

## 2024-07-06 NOTE — Telephone Encounter (Signed)
 Spoke w/ Pt- made him aware, he is not on Zepbound , okay w/ starting Wegovy .

## 2024-07-06 NOTE — Telephone Encounter (Signed)
 I did que up the mediine to go to [facility] that is where he wants it sent ? If so will have to be 3 month supply=then we will go up on it very slowly    Confirm that and I will send

## 2024-07-21 ENCOUNTER — Other Ambulatory Visit: Payer: Self-pay | Admitting: Family Medicine

## 2024-07-21 ENCOUNTER — Telehealth: Payer: Self-pay | Admitting: *Deleted

## 2024-07-21 NOTE — Telephone Encounter (Signed)
 Pt sent a message saying:  Hi Dr. Randeen, Here's the readings since 11/5.  Let me know what you would like to do.  :-) Thanks Tom   07/06/2024      Evening         143/93 07/07/2024      Morning         131/85 07/07/2024      Afternoon       117/88 07/07/2024      Evening         136/86 07/11/2024      Morning         133/81 07/11/2024      Afternoon       128/84 07/11/2024      Evening         139/84 07/12/2024      Morning         126/83 07/12/2024      Afternoon       137/87 07/12/2024      Evening         141/87 07/13/2024      Morning         125/86 07/13/2024      Afternoon       153/94 07/13/2024      Evening         140/84 07/14/2024      Morning         127/75 07/14/2024      Afternoon       135/83 07/14/2024      Evening         137/85 07/18/2024      Morning         119/79 07/18/2024      Afternoon       139/92 07/18/2024      Evening         126/77 07/19/2024      Morning         134/92 07/19/2024      Afternoon       131/80 07/19/2024      Evening         143/82 07/20/2024      Morning         127/83 07/20/2024      Afternoon       140/88 07/20/2024      Evening         143/88 07/21/2024      Morning         133/87 07/21/2024      Afternoon       129/81

## 2024-07-21 NOTE — Telephone Encounter (Signed)
 Looking better  This is with benazepril  40 mg daily and amlodipine  5 mg daily , correct?  Have we ever tried you on amlodipine  10 mg daily?  If so let me know  If not - that would be next step and I think we could get closer to goal

## 2024-07-22 MED ORDER — AMLODIPINE BESYLATE 10 MG PO TABS: 10.0000 mg | ORAL_TABLET | Freq: Every day | ORAL | 0 refills | Status: DC

## 2024-07-22 NOTE — Telephone Encounter (Signed)
 Pt responded saying:  Didn't know Metoprolol  did that.  Good to know.  I'd rather do what Dr Randeen suggests.  I only mentioned it because that's the only other BP med I've had.   Can you put in a Prescription for the amlodipine  10mg ?  Is it possible to get both combined into 1 pill instead of 2 separate ones?  Please send it to [facility].  I still have some amlodipine  but doubling up I'll run out in a week or so.   I'll continue taking my BP and send you an update after Thanksgiving.

## 2024-07-22 NOTE — Telephone Encounter (Signed)
Sent mychart letting pt know Dr. Tower's comments. 

## 2024-07-22 NOTE — Telephone Encounter (Signed)
 I sent it  Please update with how it goes No combo available  at these doses

## 2024-07-22 NOTE — Telephone Encounter (Signed)
 Metoprolol  brings down the pulse rate also - that may have made you more dizzy  Try the amlodipine  at 10 first (this time it may be different due to combo with higher ace dose)  Let us  know if you need some sent in Update us  next week   Thanks for keeping up with this  Also keep working on healthy low sodium diet/exercise etc

## 2024-07-22 NOTE — Addendum Note (Signed)
 Addended by: RANDEEN HARDY A on: 07/22/2024 04:23 PM   Modules accepted: Orders

## 2024-07-22 NOTE — Telephone Encounter (Signed)
 Pt responded via mychart and said:  My current prescriptions are the benazepril  40 mg daily and amlodipine  5 mg daily.  I've never been prescribed 10mg  amlodipine .  Only the 5/10 combo for amlodipine  back when I started on BP meds.  It didn't bring it down so you put me on Lotrel  5/10 from there afterwards.   I did get a Metoprolol  Tartrate 100mg  tablet back in January for the CAT/MRI heart scans.  I was told my BP needed to be low for it to be accurate and not to drive.  After I took it, I could tell my BP was low because of being swimmy headed for a while.  I remember the tech saying my low was 56.  I'm willing to try whatever you think is correct.  If you do go with the Metoprolol , please not 100mg .  I still need to function during the day.  :-) Thanks Charlena

## 2024-08-10 NOTE — Telephone Encounter (Signed)
 Please schedule appointment for both blood pressure and sleep issues when able  We will do the best we can  I want to re check blood pressure here on our cuff Thanks for reaching out

## 2024-08-15 NOTE — Progress Notes (Unsigned)
 Subjective:    Patient ID: Jeffrey Hartman, male    DOB: [birth year], 63 y.o.   MRN: [id number]  HPI  Wt Readings from Last 3 Encounters:  08/16/24 291 lb 8 oz (132.2 kg)  04/22/24 294 lb 6 oz (133.5 kg)  10/05/23 295 lb 11.2 oz (134.1 kg)   41.83 kg/m  Vitals:   08/16/24 0932 08/16/24 1000  BP: 118/78 113/60  Pulse: 72   Temp: 97.7 F (36.5 C)   SpO2: 96%      Pt presents for follow up of  HTN  Sleep problems    HTN bp is stable today  No cp or palpitations or headaches or edema  No side effects to medicines  BP Readings from Last 3 Encounters:  08/16/24 113/60  04/22/24 124/78  10/05/23 124/88     Lab Results  Component Value Date   NA 140 04/15/2024   K 4.4 04/15/2024   CO2 25 04/15/2024   GLUCOSE 82 04/15/2024   BUN 16 04/15/2024   CREATININE 1.06 04/15/2024   CALCIUM  8.9 04/15/2024   GFR 75.89 04/15/2024   EGFR 76 08/21/2023   GFRNONAA 68 (L) 07/11/2014   Amlodipine  10 mg daily  Benazepril  20 mg -taking 2 pills daily   Blood pressure at home  120s-low 140s /70s to 80s  Systolic most likely avg in 130s   Pt takes his medicines at night  Blood pressure tends to be better first thing in am   When blood pressure is up  Pressure and flushing in his face and head   Does not add salt to food  Does eat some processed food  Does go out to eat  Has to eat out due to schedule   Exercise  Using weights several times per week 30 minutes) - barbells, some core work  [employer] when the weather permits   30-45 minutes    Sleep Ambien -did not tolerate in the past- had a hang over /brain fog and fatigue   Does use cpap  (working well!)  Currently uses Lavender oil  Aleril over the counter (herbal, volarian, tryptophan) Doxylamine succ from [place] -sleep aid over the counter  When they stop working uses Zquil liquid (gives him RLS sympotms)  Cannot fall asleep  Needs 6 hours per night  Getting 4 some days   Sets thermostat low Heavy  quilt Listens to ambient sounds  Brain is busy when he tries to go to sleep   Takes lexapro  for OCD and anxiety   Takes gabapentin  for neck pain prn Methocarbamol  prn   Does not feel the need for counseling for stress   Stress is less than it was- caring for in laws - no longer 24 7      Patient Active Problem List   Diagnosis Date Noted   Caregiver stress 04/24/2024   Elevated coronary artery calcium  score 06/17/2023   Light headedness 09/29/2022   Family history of colon cancer 07/21/2022   Insomnia 04/17/2021   Prediabetes 04/10/2020   Eczema 08/24/2015   Cerumen impaction 06/16/2014   Low testosterone  11/09/2013   Prostate cancer screening 11/02/2013   Adenomatous colon polyp 07/28/2012   Family history of malignant neoplasm of gastrointestinal tract 06/29/2012   Anal fissure 06/29/2012   History of colonic polyps 06/29/2012   Routine general medical examination at a health care facility 09/17/2011   Morbid obesity (HCC) 01/01/2010   Allergic rhinitis 10/13/2008   Obstructive sleep apnea 08/11/2007   Obsessive-compulsive disorder 01/21/2007  ACTINIC SKIN DAMAGE 01/21/2007   Hyperlipidemia 01/13/2007   Essential hypertension 01/13/2007   Past Medical History:  Diagnosis Date   Allergy 1968   Seasonal allergies   Anxiety states    Arthritis 2010   Hands, knees, shoulder   Dermatophytosis of nail    Heart murmur 1968   Childhood.  Gone now.   Obsessive-compulsive disorders    Obstructive sleep apnea (adult) (pediatric)    Other and unspecified hyperlipidemia    Seasonal allergic rhinitis    Sleep apnea 2000   On CPAP   Unspecified dermatitis due to sun    Unspecified essential hypertension    Unspecified sleep apnea    Past Surgical History:  Procedure Laterality Date   COLONOSCOPY  11/02, 11/05   polyps   CYSTECTOMY     cyst on hand   Social History[1] Family History  Problem Relation Age of Onset   Colon cancer Mother 62   Anxiety disorder  Mother    Arthritis Mother    Depression Mother    Obesity Mother    Hypertension Father    Diabetes type II Father    Cancer - Other Father 53       Adrenal Cancer   Diabetes Father    Heart disease Father    Hyperlipidemia Father    Obesity Father    Prostate cancer Neg Hx    Colon polyps Neg Hx    Esophageal cancer Neg Hx    Stomach cancer Neg Hx    Rectal cancer Neg Hx    Allergies[2] Medications Ordered Prior to Encounter[3]  Review of Systems  Constitutional:  Positive for fatigue. Negative for activity change, appetite change, fever and unexpected weight change.  HENT:  Negative for congestion, rhinorrhea, sore throat and trouble swallowing.   Eyes:  Negative for pain, redness, itching and visual disturbance.  Respiratory:  Negative for cough, chest tightness, shortness of breath and wheezing.   Cardiovascular:  Negative for chest pain and palpitations.  Gastrointestinal:  Negative for abdominal pain, blood in stool, constipation, diarrhea and nausea.  Endocrine: Negative for cold intolerance, heat intolerance, polydipsia and polyuria.  Genitourinary:  Negative for difficulty urinating, dysuria, frequency and urgency.  Musculoskeletal:  Negative for arthralgias, joint swelling and myalgias.  Skin:  Negative for pallor and rash.  Neurological:  Negative for dizziness, tremors, weakness, numbness and headaches.  Hematological:  Negative for adenopathy. Does not bruise/bleed easily.  Psychiatric/Behavioral:  Positive for sleep disturbance. Negative for decreased concentration and dysphoric mood. The patient is nervous/anxious.        Objective:   Physical Exam Constitutional:      General: He is not in acute distress.    Appearance: Normal appearance. He is well-developed. He is obese. He is not ill-appearing or diaphoretic.  HENT:     Head: Normocephalic and atraumatic.  Eyes:     Conjunctiva/sclera: Conjunctivae normal.     Pupils: Pupils are equal, round, and  reactive to light.  Neck:     Thyroid : No thyromegaly.     Vascular: No carotid bruit or JVD.  Cardiovascular:     Rate and Rhythm: Normal rate and regular rhythm.     Heart sounds: Normal heart sounds.     No gallop.  Pulmonary:     Effort: Pulmonary effort is normal. No respiratory distress.     Breath sounds: Normal breath sounds. No wheezing or rales.  Abdominal:     General: There is no distension or abdominal bruit.  Palpations: Abdomen is soft.  Musculoskeletal:     Cervical back: Normal range of motion and neck supple.     Right lower leg: No edema.     Left lower leg: No edema.  Lymphadenopathy:     Cervical: No cervical adenopathy.  Skin:    General: Skin is warm and dry.     Coloration: Skin is not pale.     Findings: No rash.  Neurological:     Mental Status: He is alert.     Coordination: Coordination normal.     Deep Tendon Reflexes: Reflexes are normal and symmetric. Reflexes normal.  Psychiatric:        Mood and Affect: Mood normal.     Comments: Mood is good today  Candidly discusses symptoms and stressors             Assessment & Plan:   Problem List Items Addressed This Visit       Cardiovascular and Mediastinum   Essential hypertension - Primary   bp in fair control at this time  Quite a bit better  Unsure if his cuff at home is accurate  BP Readings from Last 1 Encounters:  08/16/24 113/60   No changes needed Most recent labs reviewed  Disc lifstyle change with low sodium diet and exercise   Plan to continue Amlodipine  10 mg daily in evening Benazepril  40 mg in evening   Bmet today  Instructed to check blood pressure only if it feels high/low  ? Consider a new cuff  Will continue to work on lifestyle change Handouts given        Relevant Medications   benazepril  (LOTENSIN ) 40 MG tablet   amLODipine  (NORVASC ) 10 MG tablet   Other Relevant Orders   Basic metabolic panel with GFR     Other   Morbid obesity (HCC)    Glp-1 meds are no longer covered He did go to a weight clinic at [facility] but due to current schedule constraints could not 100% commit to it   Encouraged strongly to stop eating out (consider ordering in for family allowing self to fix healthy foods) Discussed exercise-doing better  Consider a walking pad for bad weather days   Will continue to follow  No doubt weight loss would help HTN and OSA      Insomnia   Difficulty falling asleep due to busy brain  Anxiety/stress reaction may play role  Discussed sleep hygiene Handout given  Encouraged to exercise daily but not late in the day  May try guided meditation when falling asleep  Cpap continues to work well   Has tried over the counter herbal remedies and antihistamines Did not tolerate ambien in distant past   Suggested taking his gabapentin  300 mg (uses for neck pain) daily at bedtime as it helps sleep  Will report back   If not helpful may consider trazodone (with caution since already on ssri)  Declines counseling for stress reactoin       Caregiver stress   Continues to struggle with self care time caring for in laws   Has to eat out frequently due to time constraints Discussed option of ordering differently/considering food delivery or fixing own meals to eat before/after    Declines mental health counseling for stress reaction currently           [1]  Social History Tobacco Use   Smoking status: Never   Smokeless tobacco: Never  Vaping Use   Vaping status: Never Used  Substance Use Topics   Alcohol use: Yes    Alcohol/week: 0.0 standard drinks of alcohol    Comment: occasionally,1 beer once month   Drug use: No  [2]  Allergies Allergen Reactions   Shellfish Allergy Shortness Of Breath and Other (See Comments)    Reaction=tunnel vision   Sertraline Hcl Nausea Only    REACTION: nausea, ED  [3]  Current Outpatient Medications on File Prior to Visit  Medication Sig Dispense Refill   albuterol   (VENTOLIN  HFA) 108 (90 Base) MCG/ACT inhaler Inhale 2 puffs into the lungs every 6 (six) hours as needed for wheezing or shortness of breath. 8 g 0   aspirin 81 MG tablet Take 81 mg by mouth daily.       Azelastine  HCl (ASTEPRO ) 0.15 % SOLN Place 1 spray into both nostrils 2 (two) times daily. 90 mL 3   Coenzyme Q10 (CO Q-10 PO) Take by mouth daily.     Docusate Sodium 100 MG capsule Take 100 mg by mouth as needed.     escitalopram  (LEXAPRO ) 20 MG tablet TAKE 1 TABLET DAILY 90 tablet 3   Fexofenadine HCl (ALLEGRA PO) Take 1 tablet by mouth daily. As directed     fish oil-omega-3 fatty acids 1000 MG capsule Take 1 g by mouth daily.      fluticasone  (FLONASE ) 50 MCG/ACT nasal spray Place 2 sprays into both nostrils daily as needed. 48 g 3   folic acid (FOLVITE) 400 MCG tablet Take 400 mcg by mouth daily.     gabapentin  (NEURONTIN ) 300 MG capsule Take 1 capsule (300 mg total) by mouth at bedtime. (Patient taking differently: Take 300 mg by mouth as needed.) 90 capsule 3   hydrocortisone  (ANUSOL -HC) 25 MG suppository Place 1 suppository (25 mg total) rectally every evening. As needed for 7 days as needed for rectal pain or bleeding 14 suppository 2   methocarbamol  (ROBAXIN ) 500 MG tablet Take 1 tablet (500 mg total) by mouth every 8 (eight) hours as needed for muscle spasms. 90 tablet 2   metoprolol  tartrate (LOPRESSOR ) 100 MG tablet Take 1 tablet (100 mg total) by mouth as directed. Take one tablet (2) hours before your CT scan 1 tablet 0   Multiple Vitamin (MULTIVITAMIN) capsule Take 1 capsule by mouth daily.     Multiple Vitamins-Minerals (PRESERVISION AREDS PO) Take by mouth in the morning and at bedtime.     OVER THE COUNTER MEDICATION Per patient taking Vitamin D3 with K2     OVER THE COUNTER MEDICATION Metamucil Fiber taking     OVER THE COUNTER MEDICATION Bio Astin 12 mg taking daily     OVER THE COUNTER MEDICATION Sleep Aid nightly     rosuvastatin  (CRESTOR ) 20 MG tablet TAKE 1 TABLET DAILY  90 tablet 1   zinc gluconate 50 MG tablet Take 50 mg by mouth every other day.     No current facility-administered medications on file prior to visit.

## 2024-08-16 ENCOUNTER — Ambulatory Visit: Payer: Self-pay | Admitting: Family Medicine

## 2024-08-16 ENCOUNTER — Ambulatory Visit: Admitting: Family Medicine

## 2024-08-16 VITALS — BP 113/60 | HR 72 | Temp 97.7°F | Ht 70.0 in | Wt 291.5 lb

## 2024-08-16 DIAGNOSIS — I10 Essential (primary) hypertension: Secondary | ICD-10-CM

## 2024-08-16 DIAGNOSIS — Z636 Dependent relative needing care at home: Secondary | ICD-10-CM

## 2024-08-16 DIAGNOSIS — G4709 Other insomnia: Secondary | ICD-10-CM

## 2024-08-16 LAB — BASIC METABOLIC PANEL WITH GFR
BUN: 19 mg/dL (ref 6–23)
CO2: 27 meq/L (ref 19–32)
Calcium: 9.7 mg/dL (ref 8.4–10.5)
Chloride: 103 meq/L (ref 96–112)
Creatinine, Ser: 1.21 mg/dL (ref 0.40–1.50)
GFR: 64.59 mL/min (ref >60.00)
Glucose, Bld: 95 mg/dL (ref 70–99)
Potassium: 4.5 meq/L (ref 3.5–5.1)
Sodium: 139 meq/L (ref 135–145)

## 2024-08-16 MED ORDER — AMLODIPINE BESYLATE 10 MG PO TABS: 10.0000 mg | ORAL_TABLET | Freq: Every day | ORAL | 3 refills | Status: AC

## 2024-08-16 MED ORDER — GABAPENTIN 300 MG PO CAPS: 300.0000 mg | ORAL_CAPSULE | Freq: Every day | ORAL | 1 refills | Status: AC

## 2024-08-16 MED ORDER — BENAZEPRIL HCL 40 MG PO TABS: 40.0000 mg | ORAL_TABLET | Freq: Every day | ORAL | 3 refills | Status: AC

## 2024-08-16 MED ORDER — METHOCARBAMOL 500 MG PO TABS: 500.0000 mg | ORAL_TABLET | Freq: Three times a day (TID) | ORAL | 2 refills | Status: AC | PRN

## 2024-08-16 NOTE — Assessment & Plan Note (Signed)
 bp in fair control at this time  Quite a bit better  Unsure if his cuff at home is accurate  BP Readings from Last 1 Encounters:  08/16/24 113/60   No changes needed Most recent labs reviewed  Disc lifstyle change with low sodium diet and exercise   Plan to continue Amlodipine  10 mg daily in evening Benazepril  40 mg in evening   Bmet today  Instructed to check blood pressure only if it feels high/low  ? Consider a new cuff  Will continue to work on lifestyle change Handouts given

## 2024-08-16 NOTE — Assessment & Plan Note (Addendum)
 Continues to struggle with self care time caring for in laws   Has to eat out frequently due to time constraints Discussed option of ordering differently/considering food delivery or fixing own meals to eat before/after    Declines mental health counseling for stress reaction currently

## 2024-08-16 NOTE — Assessment & Plan Note (Signed)
 Difficulty falling asleep due to busy brain  Anxiety/stress reaction may play role  Discussed sleep hygiene Handout given  Encouraged to exercise daily but not late in the day  May try guided meditation when falling asleep  Cpap continues to work well   Has tried over the counter herbal remedies and antihistamines Did not tolerate ambien in distant past   Suggested taking his gabapentin  300 mg (uses for neck pain) daily at bedtime as it helps sleep  Will report back   If not helpful may consider trazodone (with caution since already on ssri)  Declines counseling for stress reactoin

## 2024-08-16 NOTE — Assessment & Plan Note (Signed)
 Glp-1 meds are no longer covered He did go to a weight clinic at [facility] but due to current schedule constraints could not 100% commit to it   Encouraged strongly to stop eating out (consider ordering in for family allowing self to fix healthy foods) Discussed exercise-doing better  Consider a walking pad for bad weather days   Will continue to follow  No doubt weight loss would help HTN and OSA

## 2024-08-16 NOTE — Patient Instructions (Addendum)
 Consider getting a walking pad so you can walk indoors on cold and hot days   The Calm app is good for some sleep programs   Start taking gabapentin  300 mg daily a hour before bed  Let us  know if this does not help   Continue current blood pressure medicine   Keep working on diet and exercise  Labs today  Only check blood pressure if you feel like it is high or low

## 2024-09-12 ENCOUNTER — Encounter: Payer: Self-pay | Admitting: Family Medicine

## 2024-09-13 MED ORDER — TRAZODONE HCL 50 MG PO TABS: 50.0000 mg | ORAL_TABLET | Freq: Every evening | ORAL | 0 refills | Status: AC | PRN

## 2025-04-17 ENCOUNTER — Other Ambulatory Visit

## 2025-04-24 ENCOUNTER — Encounter: Admitting: Family Medicine
